# Patient Record
Sex: Female | Born: 1935 | Race: White | Hispanic: No | Smoking: Former smoker
Health system: Southern US, Community
[De-identification: ages and names within clinical notes are randomized; demographics above are authoritative.]

## PROBLEM LIST (undated history)

## (undated) DIAGNOSIS — I071 Rheumatic tricuspid insufficiency: Secondary | ICD-10-CM

## (undated) DIAGNOSIS — G8929 Other chronic pain: Secondary | ICD-10-CM

## (undated) DIAGNOSIS — K5720 Diverticulitis of large intestine with perforation and abscess without bleeding: Secondary | ICD-10-CM

## (undated) DIAGNOSIS — I10 Essential (primary) hypertension: Secondary | ICD-10-CM

## (undated) DIAGNOSIS — K635 Polyp of colon: Secondary | ICD-10-CM

## (undated) DIAGNOSIS — E785 Hyperlipidemia, unspecified: Secondary | ICD-10-CM

## (undated) DIAGNOSIS — I340 Nonrheumatic mitral (valve) insufficiency: Secondary | ICD-10-CM

## (undated) DIAGNOSIS — I429 Cardiomyopathy, unspecified: Secondary | ICD-10-CM

## (undated) DIAGNOSIS — I509 Heart failure, unspecified: Secondary | ICD-10-CM

## (undated) DIAGNOSIS — M549 Dorsalgia, unspecified: Secondary | ICD-10-CM

## (undated) HISTORY — DX: Hyperlipidemia, unspecified: E78.5

## (undated) HISTORY — DX: Nonrheumatic mitral (valve) insufficiency: I34.0

## (undated) HISTORY — PX: ABDOMINAL HYSTERECTOMY: SHX81

## (undated) HISTORY — DX: Rheumatic tricuspid insufficiency: I07.1

## (undated) HISTORY — PX: BLADDER SUSPENSION: SHX72

## (undated) HISTORY — DX: Heart failure, unspecified: I50.9

## (undated) HISTORY — DX: Cardiomyopathy, unspecified: I42.9

## (undated) HISTORY — DX: Essential (primary) hypertension: I10

---

## 2000-09-08 ENCOUNTER — Other Ambulatory Visit: Admission: RE | Admit: 2000-09-08 | Discharge: 2000-09-08 | Payer: Self-pay

## 2001-01-14 ENCOUNTER — Encounter: Payer: Self-pay | Admitting: Family Medicine

## 2001-01-14 ENCOUNTER — Encounter: Admission: RE | Admit: 2001-01-14 | Discharge: 2001-01-14 | Payer: Self-pay | Admitting: Family Medicine

## 2002-01-17 ENCOUNTER — Encounter: Admission: RE | Admit: 2002-01-17 | Discharge: 2002-01-17 | Payer: Self-pay | Admitting: *Deleted

## 2003-01-16 ENCOUNTER — Encounter: Admission: RE | Admit: 2003-01-16 | Discharge: 2003-01-16 | Payer: Self-pay | Admitting: Neurosurgery

## 2003-01-16 ENCOUNTER — Encounter: Payer: Self-pay | Admitting: Neurosurgery

## 2003-01-24 ENCOUNTER — Encounter: Payer: Self-pay | Admitting: Neurosurgery

## 2003-01-24 ENCOUNTER — Encounter: Admission: RE | Admit: 2003-01-24 | Discharge: 2003-01-24 | Payer: Self-pay | Admitting: Neurosurgery

## 2003-01-24 ENCOUNTER — Encounter: Payer: Self-pay | Admitting: Diagnostic Radiology

## 2003-02-09 ENCOUNTER — Ambulatory Visit (HOSPITAL_COMMUNITY): Admission: RE | Admit: 2003-02-09 | Discharge: 2003-02-10 | Payer: Self-pay | Admitting: Neurosurgery

## 2003-02-09 ENCOUNTER — Encounter: Payer: Self-pay | Admitting: Neurosurgery

## 2003-12-12 ENCOUNTER — Ambulatory Visit (HOSPITAL_COMMUNITY): Admission: RE | Admit: 2003-12-12 | Discharge: 2003-12-12 | Payer: Self-pay | Admitting: Anesthesiology

## 2004-01-21 ENCOUNTER — Ambulatory Visit (HOSPITAL_COMMUNITY): Admission: RE | Admit: 2004-01-21 | Discharge: 2004-01-21 | Payer: Self-pay | Admitting: Otolaryngology

## 2004-01-29 ENCOUNTER — Encounter: Admission: RE | Admit: 2004-01-29 | Discharge: 2004-01-29 | Payer: Self-pay | Admitting: Otolaryngology

## 2005-05-16 ENCOUNTER — Inpatient Hospital Stay (HOSPITAL_COMMUNITY): Admission: RE | Admit: 2005-05-16 | Discharge: 2005-05-18 | Payer: Self-pay | Admitting: Obstetrics and Gynecology

## 2007-01-17 ENCOUNTER — Emergency Department (HOSPITAL_COMMUNITY): Admission: EM | Admit: 2007-01-17 | Discharge: 2007-01-17 | Payer: Self-pay | Admitting: Emergency Medicine

## 2013-06-16 ENCOUNTER — Emergency Department (HOSPITAL_COMMUNITY): Payer: Medicare Other

## 2013-06-16 ENCOUNTER — Emergency Department (HOSPITAL_COMMUNITY)
Admission: EM | Admit: 2013-06-16 | Discharge: 2013-06-16 | Disposition: A | Discharge status: Home / Self Care | Payer: Medicare Other | Attending: Emergency Medicine | Admitting: Emergency Medicine

## 2013-06-16 ENCOUNTER — Encounter (HOSPITAL_COMMUNITY): Payer: Self-pay

## 2013-06-16 DIAGNOSIS — Z87891 Personal history of nicotine dependence: Secondary | ICD-10-CM | POA: Insufficient documentation

## 2013-06-16 DIAGNOSIS — Z860100 Personal history of colon polyps, unspecified: Secondary | ICD-10-CM | POA: Insufficient documentation

## 2013-06-16 DIAGNOSIS — S93409A Sprain of unspecified ligament of unspecified ankle, initial encounter: Secondary | ICD-10-CM | POA: Insufficient documentation

## 2013-06-16 DIAGNOSIS — G8929 Other chronic pain: Secondary | ICD-10-CM | POA: Insufficient documentation

## 2013-06-16 DIAGNOSIS — W010XXA Fall on same level from slipping, tripping and stumbling without subsequent striking against object, initial encounter: Secondary | ICD-10-CM | POA: Insufficient documentation

## 2013-06-16 DIAGNOSIS — Y929 Unspecified place or not applicable: Secondary | ICD-10-CM | POA: Insufficient documentation

## 2013-06-16 DIAGNOSIS — Z7982 Long term (current) use of aspirin: Secondary | ICD-10-CM | POA: Insufficient documentation

## 2013-06-16 DIAGNOSIS — S93402A Sprain of unspecified ligament of left ankle, initial encounter: Secondary | ICD-10-CM

## 2013-06-16 DIAGNOSIS — Z79899 Other long term (current) drug therapy: Secondary | ICD-10-CM | POA: Insufficient documentation

## 2013-06-16 DIAGNOSIS — Y9301 Activity, walking, marching and hiking: Secondary | ICD-10-CM | POA: Insufficient documentation

## 2013-06-16 HISTORY — DX: Other chronic pain: G89.29

## 2013-06-16 HISTORY — DX: Diverticulitis of large intestine with perforation and abscess without bleeding: K57.20

## 2013-06-16 HISTORY — DX: Polyp of colon: K63.5

## 2013-06-16 HISTORY — DX: Dorsalgia, unspecified: M54.9

## 2013-06-16 NOTE — ED Provider Notes (Signed)
CSN: [id number]     Arrival date & time 06/16/13  1407 History   First MD Initiated Contact with Patient 06/16/13 1626     Chief Complaint  Patient presents with  . Fall  . Ankle Injury   (Consider location/radiation/quality/duration/timing/severity/associated sxs/prior Treatment) HPI Comments: Patient presents with pain and swelling in the left ankle after an inversion injury while walking. This occurred earlier this afternoon  Patient is a 79 y.o. female presenting with lower extremity injury. The history is provided by the patient.  Ankle Injury This is a new problem. The current episode started 3 to 5 hours ago. The problem occurs constantly. The problem has not changed since onset.The symptoms are aggravated by walking. Nothing relieves the symptoms. She has tried nothing for the symptoms. The treatment provided no relief.    Past Medical History  Diagnosis Date  . Chronic back pain   . Colonic diverticular abscess   . Colon polyps    Past Surgical History  Procedure Laterality Date  . Abdominal hysterectomy    . Bladder suspension     No family history on file. History  Substance Use Topics  . Smoking status: Former Games developer  . Smokeless tobacco: Never Used  . Alcohol Use: No   OB History   Grav Para Term Preterm Abortions TAB SAB Ect Mult Living                 Review of Systems  All other systems reviewed and are negative.    Allergies  Morphine and related  Home Medications   Current Outpatient Rx  Name  Route  Sig  Dispense  Refill  . amitriptyline (ELAVIL) 10 MG tablet   Oral   Take 10 mg by mouth at bedtime.         Marland Kitchen aspirin EC 81 MG tablet   Oral   Take 81 mg by mouth daily.         . butalbital-acetaminophen-caffeine (FIORICET, ESGIC) 50-325-40 MG per tablet   Oral   Take 1 tablet by mouth 2 (two) times daily as needed for headache.         . fish oil-omega-3 fatty acids 1000 MG capsule   Oral   Take 1 g by mouth 2 (two) times  daily.         Marland Kitchen HYDROcodone-acetaminophen (NORCO) 10-325 MG per tablet   Oral   Take 1 tablet by mouth every 4 (four) hours as needed for pain.         Marland Kitchen LORazepam (ATIVAN) 1 MG tablet   Oral   Take 1 mg by mouth every 8 (eight) hours.          BP 142/58  Pulse 75  Temp(Src) 98.9 F (37.2 C) (Oral)  Resp 16  SpO2 96% Physical Exam  Nursing note and vitals reviewed. Constitutional: She is oriented to person, place, and time. She appears well-developed and well-nourished. No distress.  HENT:  Head: Normocephalic and atraumatic.  Mouth/Throat: Oropharynx is clear and moist.  Neck: Normal range of motion. Neck supple.  Musculoskeletal:  There is swelling and ecchymosis inferior to the lateral malleolus of the left ankle. There is no proximal fibular tenderness to palpation and no fifth metatarsal tenderness to palpation. The ankle joint appears stable  Neurological: She is alert and oriented to person, place, and time.  Skin: Skin is warm and dry. She is not diaphoretic.    ED Course  Procedures (including critical care time) Labs Review Labs  Reviewed - No data to display Imaging Review No results found.  MDM  No diagnosis found. X-rays reveal no evidence for fracture. This appears to be a sprain. We'll treat with rest, ice, elevation, and when necessary followup.    Geoffery Lyons, MD 06/16/13 1736

## 2013-06-16 NOTE — Progress Notes (Signed)
   CARE MANAGEMENT ED NOTE 06/16/2013  Patient:  Anita Martinez, Anita Martinez   Account Number:  [id number]  Date Initiated:  06/16/2013  Documentation initiated by:  Edd Arbour  Subjective/Objective Assessment:     Subjective/Objective Assessment Detail:     Action/Plan:   Action/Plan Detail:   Medicare pt without pcp listed Spoke with pt who confirms pcp as Dwight williams Reports having a pain management dr for her back pain Cm assisted pt from w/c to ED chair with reclining legs to decrease her pain   Anticipated DC Date:       Status Recommendation to Physician:   Result of Recommendation:    Other ED Services  Consult Working Plan    DC [employer]  Other  PCP issues  Outpatient Services - Pt will follow up    Choice offered to / List presented to:            Status of service:  Completed, signed off  ED Comments:   ED Comments Detail:

## 2013-06-16 NOTE — ED Notes (Signed)
Pt presents after slipping and falling this morning.  C/o L ankle injury.  Pain score 10/10.  Swelling noted.  Denies LOC.  Sts chronic back pain.

## 2013-06-24 ENCOUNTER — Ambulatory Visit (INDEPENDENT_AMBULATORY_CARE_PROVIDER_SITE_OTHER): Payer: Medicare Other | Admitting: Internal Medicine

## 2013-06-24 VITALS — BP 110/70 | HR 80 | Temp 98.0°F | Resp 18 | Ht 66.0 in | Wt 136.0 lb

## 2013-06-24 DIAGNOSIS — M5432 Sciatica, left side: Secondary | ICD-10-CM

## 2013-06-24 DIAGNOSIS — G8929 Other chronic pain: Secondary | ICD-10-CM

## 2013-06-24 DIAGNOSIS — M5430 Sciatica, unspecified side: Secondary | ICD-10-CM

## 2013-06-24 DIAGNOSIS — Z23 Encounter for immunization: Secondary | ICD-10-CM

## 2013-06-24 MED ORDER — KETOROLAC TROMETHAMINE 60 MG/2ML IM SOLN
60.0000 mg | Freq: Once | INTRAMUSCULAR | Status: AC
  Administered 2013-06-24: 60 mg via INTRAMUSCULAR

## 2013-06-24 NOTE — Progress Notes (Signed)
Subjective:    Patient ID: Anita Martinez, female    DOB: 1933/07/25, 79 y.o.   MRN: [id number]  HPI Having pain in left hip. Uses lidoderm 5% patches. Used 2 last night without relief. Has had sciatica in the past, thinks it is brought on by stress. Last episode of sciatica was 4-5 months ago. Usually gets a shot from Dr. Felicita Gage that relieves pain with in a couple of hours and she is able to drive after. Takes Norco- 2 capsules in the morning with ativan, takes two more norco and ativan about 2 pm, takes 2 capsules with elavil at bedtime.  Fell on a damp stepping stone and sprained left foot a couple of weeks ago. Has wrapped foot. Doing better, walks every day. No other falls in last 6 months.   Husband died last month. Husband died unexpectedly. They didn't live together. Has caregiver who helps with household chores. Patient able to do ADLs and drive.   [facility] pain clinic for back pain/ surgery 2003 disc surgery and vertebrae debridement, has scoliosis. Has been going for years. Saw Dr. Joylene John until she left and has had problems since her regular provider left. Saw Dr. Modena Morrow until he left then was seeing Dr. Mayford Knife.  PSH- Hysterectomy 1964 Bladder sling- year unknown. Disc surgery 2003 Colon abscess 2008- was disabled after hospitalization. Wears abdominal binding since bladder sling/colon abscess surgery, also provides support for back.  SH- Lives alone Daughter lives in [place], Son lives in [place], one son incarcerated. Sister lives near by. Walks with a friend 5x week.   Review of Systems  Constitutional: Positive for activity change. Negative for fever, appetite change and fatigue.  HENT: Positive for hearing loss. Negative for ear pain, congestion, sore throat, rhinorrhea, sneezing, mouth sores, neck pain, neck stiffness, dental problem and postnasal drip.        Left ear nerve damage, hearing loss  Eyes:  Negative.   Respiratory: Negative for apnea, cough, chest tightness, shortness of breath and wheezing.   Cardiovascular: Negative for chest pain, palpitations and leg swelling.  Gastrointestinal: Positive for constipation. Negative for nausea, vomiting, abdominal pain, diarrhea and blood in stool.       Constipation since colon surgery. Uses miralax to have BM 3x week.   Genitourinary: Positive for urgency and frequency. Negative for dysuria, hematuria and difficulty urinating.       Urinary frequency related to high fluid intake. No nocturia.  Musculoskeletal: Positive for back pain.  Neurological: Positive for headaches. Negative for weakness and numbness.       Has occasional headaches.       Objective:   Physical Exam  Constitutional: She is oriented to person, place, and time. She appears well-developed and well-nourished.  HENT:  Mouth/Throat: Oropharynx is clear and moist.  Eyes: Conjunctivae are normal.  Neck: Normal range of motion. Neck supple.  Cardiovascular: Normal rate, regular rhythm, normal heart sounds and intact distal pulses.   Pulmonary/Chest: Effort normal and breath sounds normal.  Musculoskeletal: Normal range of motion. She exhibits tenderness. She exhibits no edema.  Tender over lumbar spine and left buttock. Pain with flexion and abduction. No pain over left trochanter. No bruising, redness.  Lymphadenopathy:    She has no cervical adenopathy.  Neurological: She is alert and oriented to person, place, and time. She has normal reflexes.  Skin: Skin is warm and dry.  Psychiatric: Her behavior is normal. Judgment and thought content normal.  A little tearful when talking about her recently deceased husband.       Assessment & Plan:  Sciatica neuralgia, left - Plan: ketorolac (TORADOL) injection 60 mg Cr Back pain-f/u w/ PM HA syndr- Need for prophylactic vaccination and inoculation against influenza - Plan: Flu Vaccine QUAD 36+ mos IM  Patient to continue  monthly fu with pain clinic. Will return if no improvement or worsening of symptoms. F/U w/ primary care provider for positives in ROS   I participated fully in this evaluation with [facility] FNP I have reviewed and agree with documentation. Robert P. Merla Riches, M.D.

## 2013-06-25 DIAGNOSIS — G8929 Other chronic pain: Secondary | ICD-10-CM | POA: Insufficient documentation

## 2013-07-06 ENCOUNTER — Ambulatory Visit (INDEPENDENT_AMBULATORY_CARE_PROVIDER_SITE_OTHER): Payer: Medicare Other | Admitting: Family Medicine

## 2013-07-06 ENCOUNTER — Other Ambulatory Visit: Payer: Self-pay | Admitting: *Deleted

## 2013-07-06 VITALS — BP 110/76 | HR 76 | Temp 98.9°F | Resp 18 | Ht 64.5 in | Wt 135.6 lb

## 2013-07-06 DIAGNOSIS — B009 Herpesviral infection, unspecified: Secondary | ICD-10-CM

## 2013-07-06 MED ORDER — VALACYCLOVIR HCL 500 MG PO TABS: 500.0000 mg | ORAL_TABLET | Freq: Two times a day (BID) | ORAL | Status: DC

## 2013-07-06 NOTE — Progress Notes (Signed)
Patient ID: Anita Martinez, female   DOB: [birth year], 79 y.o.   MRN: [id number]  @[email or web address] @  Patient ID: Anita Martinez MRN: [id number], DOB: July 12, 1933, 79 y.o. Date of Encounter: 07/06/2013, 12:33 PM This chart was scribed for Anita Sidle, MD by Valera Castle, ED Scribe. This patient was seen in room 13 and the patient's care was started at 12:33 PM.   Primary Physician: No PCP Per Patient  Chief Complaint: Herpes Zoster  HPI: 79 y.o. year old female with history below presents with sudden, moderate shingles on her skin above her tail bone. She states that Dr. Quintella Reichert told her not to get a shingles vaccination, due to her weak immune system. She states she went to pain management as well, and they told her the same thing. She states her immune system is low due to an abscess in her colon, in 2008, and being on so many antibiotics. She reports obtaining a hernia from the colon procedure. She reports daily taking fish oil, 80 mg Aspirin, Vitamin D, and along with her other medications from pain management.   She states her daughter when she was about 32, started getting cold sores.      Past Medical History  Diagnosis Date  . Chronic back pain   . Colonic diverticular abscess   . Colon polyps      Home Meds: Prior to Admission medications   Medication Sig Start Date End Date Taking? Authorizing Provider  amitriptyline (ELAVIL) 10 MG tablet Take 10 mg by mouth at bedtime.   Yes Historical Provider, MD  aspirin EC 81 MG tablet Take 81 mg by mouth daily.   Yes Historical Provider, MD  butalbital-acetaminophen-caffeine (FIORICET, ESGIC) 50-325-40 MG per tablet Take 1 tablet by mouth 2 (two) times daily as needed for headache.   Yes Historical Provider, MD  fish oil-omega-3 fatty acids 1000 MG capsule Take 1 g by mouth 2 (two) times daily.   Yes Historical Provider, MD  HYDROcodone-acetaminophen (NORCO) 10-325 MG per tablet Take by mouth every 4 (four) hours as needed for pain.    Yes  Historical Provider, MD  LORazepam (ATIVAN) 1 MG tablet Take 1 mg by mouth every 8 (eight) hours.   Yes Historical Provider, MD    Allergies:  Allergies  Allergen Reactions  . Morphine And Related     Makes hyper and unable to sleep    History   Social History  . Marital Status: Married    Spouse Name: N/A    Number of Children: N/A  . Years of Education: N/A   Occupational History  . Not on file.   Social History Main Topics  . Smoking status: Former Games developer  . Smokeless tobacco: Never Used  . Alcohol Use: No  . Drug Use: No  . Sexual Activity: Not on file   Other Topics Concern  . Not on file   Social History Narrative  . No narrative on file     Review of Systems: Constitutional: negative for chills, fever, night sweats, weight changes, or fatigue  HEENT: negative for vision changes, hearing loss, congestion, rhinorrhea, ST, epistaxis, or sinus pressure Cardiovascular: negative for chest pain or palpitations Respiratory: negative for hemoptysis, wheezing, shortness of breath, or cough Abdominal: negative for abdominal pain, nausea, vomiting, diarrhea, or constipation Dermatological: Positive for skin rash Neurologic: negative for headache, dizziness, or syncope All other systems reviewed and are otherwise negative with the exception to those above and in the HPI.   Physical Exam: Blood  pressure 110/76, pulse 76, temperature 98.9 F (37.2 C), temperature source Oral, resp. rate 18, height 5' 4.5" (1.638 m), weight 135 lb 9.6 oz (61.508 kg), SpO2 96.00%., Body mass index is 22.92 kg/(m^2). General: Well developed, well nourished, in no acute distress. Head: Normocephalic, atraumatic, eyes without discharge, sclera non-icteric, nares are without discharge. Bilateral auditory canals clear, TM's are without perforation, pearly grey and translucent with reflective cone of light bilaterally. Oral cavity moist, posterior pharynx without exudate, erythema, peritonsillar  abscess, or post nasal drip.  Neck: Supple. No thyromegaly. Full ROM. No lymphadenopathy. Lungs: Clear bilaterally to auscultation without wheezes, rales, or rhonchi. Breathing is unlabored. Heart: RRR with S1 S2. No murmurs, rubs, or gallops appreciated. Abdomen: Soft, non-tender, non-distended with normoactive bowel sounds. No hepatomegaly. No rebound/guarding. No obvious abdominal masses. Msk:  Strength and tone normal for age. Extremities/Skin: Warm and dry. No clubbing or cyanosis. No edema. Cluster of pustules at superior edge of gluteal cleft. Neuro: Alert and oriented X 3. Moves all extremities spontaneously. Gait is normal. CNII-XII grossly in tact. Psych:  Responds to questions appropriately with a normal affect.    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  Signed, Anita Sidle, MD 07/06/2013 12:33 PM  Scribe generated note by Valera Castle

## 2013-07-06 NOTE — Patient Instructions (Signed)
Cold Sore  A cold sore (fever blister) is a skin infection caused by the herpes simplex virus (HSV-1). HSV-1 is closely related to the virus that causes gential herpes (HSV-2), but they are not the same even though both viruses can cause oral and genital infections. Cold sores are small, fluid-filled sores inside of the mouth or on the lips, gums, nose, chin, cheeks, or fingers.   The herpes simplex virus can be easily passed (contagious) to other people through close personal contact, such as kissing or sharing personal items. The virus can also spread to other parts of the body, such as the eyes or genitals. Cold sores are contagious until the sores crust over completely. They often heal within 2 weeks.   Once a person is infected, the herpes simplex virus remains permanently in the body. Therefore, there is no cure for cold sores, and they often recur when a person is tired, stressed, sick, or gets too much sun. Additional factors that can cause a recurrence include hormone changes in menstruation or pregnancy, certain drugs, and cold weather.   CAUSES   Cold sores are caused by the herpes simplex virus. The virus is spread from person to person through close contact, such as through kissing, touching the affected area, or sharing personal items such as lip balm, razors, or eating utensils.   SYMPTOMS   The first infection may not cause symptoms. If symptoms develop, the symptoms often go through different stages. Here is how a cold sore develops:   · Tingling, itching, or burning is felt 1 2 days before the outbreak.    · Fluid-filled blisters appear on the lips, inside the mouth, nose, or on the cheeks.    · The blisters start to ooze clear fluid.    · The blisters dry up and a yellow crust appears in its place.    · The crust falls off.    Symptoms depend on whether it is the initial outbreak or a recurrence. Some other symptoms with the first outbreak may include:   · Fever.    · Sore throat.    · Headache.     · Muscle aches.    · Swollen neck glands.    DIAGNOSIS   A diagnosis is often made based on your symptoms and looking at the sores. Sometimes, a sore may be swabbed and then examined in the lab to make a final diagnosis. If the sores are not present, blood tests can find the herpes simplex virus.   TREATMENT   There is no cure for cold sores and no vaccine for the herpes simplex virus. Within 2 weeks, most cold sores go away on their own without treatment. Medicines cannot make the infection go away, but medicine can help relieve some of the pain associated with the sores, can work to stop the virus from multiplying, and can also shorten healing time. Medicine may be in the form of creams, gels, pills, or a shot.   HOME CARE INSTRUCTIONS   · Only take over-the-counter or prescription medicines for pain, discomfort, or fever as directed by your caregiver. Do not use aspirin.    · Use a cotton-tip swab to apply creams or gels to your sores.    · Do not touch the sores or pick the scabs. Wash your hands often. Do not touch your eyes without washing your hands first.    · Avoid kissing, oral sex, and sharing personal items until sores heal.    · Apply an ice pack on your sores for 10 15 minutes to ease any   discomfort.    · Avoid hot, cold, or salty foods because they may hurt your mouth. Eat a soft, bland diet to avoid irritating the sores. Use a straw to drink if you have pain when drinking out of a glass.    · Keep sores clean and dry to prevent an infection of other tissues.    · Avoid the sun and limit stress if these things trigger outbreaks. If sun causes cold sores, apply sunscreen on the lips before being out in the sun.    SEEK MEDICAL CARE IF:   · You have a fever or persistent symptoms for more than 2 3 days.    · You have a fever and your symptoms suddenly get worse.    · You have pus, not clear fluid, coming from the sores.    · You have redness that is spreading.    · You have pain or irritation in your  eye.    · You get sores on your genitals.    · Your sores do not heal within 2 weeks.    · You have a weakened immune system.    · You have frequent recurrences of cold sores.    MAKE SURE YOU:   · Understand these instructions.  · Will watch your condition.  · Will get help right away if you are not doing well or get worse.  Document Released: 09/05/2000 Document Revised: 06/02/2012 Document Reviewed: 01/21/2012  [employer]® Patient Information ©2014 [employer].

## 2013-08-11 ENCOUNTER — Other Ambulatory Visit: Payer: Self-pay | Admitting: Pathology

## 2014-07-06 ENCOUNTER — Other Ambulatory Visit: Payer: Self-pay | Admitting: Family Medicine

## 2018-01-13 ENCOUNTER — Other Ambulatory Visit: Payer: Self-pay | Admitting: Internal Medicine

## 2018-01-13 DIAGNOSIS — R1900 Intra-abdominal and pelvic swelling, mass and lump, unspecified site: Secondary | ICD-10-CM

## 2018-01-14 ENCOUNTER — Other Ambulatory Visit: Payer: Self-pay | Admitting: Internal Medicine

## 2018-01-14 DIAGNOSIS — R198 Other specified symptoms and signs involving the digestive system and abdomen: Secondary | ICD-10-CM

## 2018-01-18 ENCOUNTER — Other Ambulatory Visit: Payer: Self-pay

## 2018-01-20 ENCOUNTER — Ambulatory Visit
Admission: RE | Admit: 2018-01-20 | Discharge: 2018-01-20 | Disposition: A | Discharge status: Home / Self Care | Payer: Medicare Other | Source: Ambulatory Visit | Attending: Internal Medicine | Admitting: Internal Medicine

## 2018-01-20 ENCOUNTER — Other Ambulatory Visit: Payer: Self-pay | Admitting: Internal Medicine

## 2018-01-20 DIAGNOSIS — R1900 Intra-abdominal and pelvic swelling, mass and lump, unspecified site: Secondary | ICD-10-CM

## 2018-01-20 MED ORDER — IOPAMIDOL (ISOVUE-300) INJECTION 61%
100.0000 mL | Freq: Once | INTRAVENOUS | Status: AC | PRN
  Administered 2018-01-20: 100 mL via INTRAVENOUS

## 2019-08-10 ENCOUNTER — Ambulatory Visit (INDEPENDENT_AMBULATORY_CARE_PROVIDER_SITE_OTHER): Payer: Medicare Other | Admitting: Otolaryngology

## 2019-08-10 ENCOUNTER — Other Ambulatory Visit: Payer: Self-pay

## 2019-08-10 ENCOUNTER — Encounter (INDEPENDENT_AMBULATORY_CARE_PROVIDER_SITE_OTHER): Payer: Self-pay | Admitting: Otolaryngology

## 2019-08-10 VITALS — Temp 97.5°F

## 2019-08-10 DIAGNOSIS — H6121 Impacted cerumen, right ear: Secondary | ICD-10-CM | POA: Diagnosis not present

## 2019-08-10 NOTE — Progress Notes (Signed)
HPI: Anita Martinez is a 85 y.o. female who presents for evaluation of wax buildup in the right ear. [facility] and had [facility]ogram that demonstrated bilateral moderate to severe sensorineural hearing loss and is scheduled to get hearing aids.  On otoscopic exam of the right ear she had wax deep adjacent to the TM.  They recommended having this cleaned prior to getting hearing aids fitted.  Past Medical History:  Diagnosis Date  . Chronic back pain   . Colon polyps   . Colonic diverticular abscess    Past Surgical History:  Procedure Laterality Date  . ABDOMINAL HYSTERECTOMY    . BLADDER SUSPENSION     Social History   Socioeconomic History  . Marital status: Married    Spouse name: Not on file  . Number of children: Not on file  . Years of education: Not on file  . Highest education level: Not on file  Occupational History  . Not on file  Social Needs  . Financial resource strain: Not on file  . Food insecurity    Worry: Not on file    Inability: Not on file  . Transportation needs    Medical: Not on file    Non-medical: Not on file  Tobacco Use  . Smoking status: Former Smoker    Packs/day: 1.00    Years: 14.00    Pack years: 14.00    Start date: 28    Quit date: 1970    Years since quitting: 50.9  . Smokeless tobacco: Never Used  Substance and Sexual Activity  . Alcohol use: No  . Drug use: No  . Sexual activity: Not on file  Lifestyle  . Physical activity    Days per week: Not on file    Minutes per session: Not on file  . Stress: Not on file  Relationships  . Social Musician on phone: Not on file    Gets together: Not on file    Attends religious service: Not on file    Active member of club or organization: Not on file    Attends meetings of clubs or organizations: Not on file    Relationship status: Not on file  Other Topics Concern  . Not on file  Social History Narrative  . Not on file   No family history on  file. Allergies  Allergen Reactions  . Morphine And Related     Makes hyper and unable to sleep   Prior to Admission medications   Medication Sig Start Date End Date Taking? Authorizing Provider  amitriptyline (ELAVIL) 10 MG tablet Take 10 mg by mouth at bedtime.   Yes [provider]  aspirin EC 81 MG tablet Take 81 mg by mouth daily.   Yes [provider]  butalbital-acetaminophen-caffeine (FIORICET, ESGIC) 50-325-40 MG per tablet Take 1 tablet by mouth 2 (two) times daily as needed for headache.   Yes [provider]  fish oil-omega-3 fatty acids 1000 MG capsule Take 1 g by mouth 2 (two) times daily.   Yes [provider]  HYDROcodone-acetaminophen (NORCO) 10-325 MG per tablet Take by mouth every 4 (four) hours as needed for pain.    Yes [provider]  LORazepam (ATIVAN) 1 MG tablet Take 1 mg by mouth every 8 (eight) hours.   Yes [provider]  valACYclovir (VALTREX) 500 MG tablet TAKE 1 TABLET BY MOUTH TWICE DAILY 07/06/14  Yes Ethelda Chick, MD  Positive ROS: Otherwise negative  All other systems have been reviewed and were otherwise negative with the exception of those mentioned in the HPI and as above.  Physical Exam: Constitutional: Alert, well-appearing, no acute distress Ears: External ears without lesions or tenderness. Ear canals she had wax deep within the right ear canal adjacent to the right TM.  She had minimal wax on the left side which was nonobstructing. Nasal: External nose without lesions. Clear nasal passages Oral: Oropharynx clear. Neck: No palpable adenopathy or masses Respiratory: Breathing comfortably  Skin: No facial/neck lesions or rash noted.  Cerumen impaction removal  Date/Time: 08/10/2019 12:17 PM Performed by: Rozetta Nunnery, MD Authorized by: Rozetta Nunnery, MD   Consent:    Consent obtained:  Verbal   Consent given by:  Patient   Risks discussed:  Pain and  bleeding Procedure details:    Location:  R ear   Procedure type: suction   Post-procedure details:    Inspection:  TM intact and canal normal   Hearing quality:  Improved   Patient tolerance of procedure:  Tolerated well, no immediate complications    Assessment: Right ear cerumen impaction  Plan: This was cleaned in the office.  She will follow-up as needed.  Radene Journey, MD

## 2019-09-12 ENCOUNTER — Other Ambulatory Visit: Payer: Self-pay | Admitting: Anesthesiology

## 2019-09-12 ENCOUNTER — Other Ambulatory Visit: Payer: Self-pay

## 2019-09-12 ENCOUNTER — Ambulatory Visit
Admission: RE | Admit: 2019-09-12 | Discharge: 2019-09-12 | Disposition: A | Discharge status: Home / Self Care | Payer: Medicare Other | Source: Ambulatory Visit | Attending: Anesthesiology | Admitting: Anesthesiology

## 2019-09-12 ENCOUNTER — Other Ambulatory Visit: Payer: Self-pay | Admitting: General Practice

## 2019-09-12 DIAGNOSIS — R52 Pain, unspecified: Secondary | ICD-10-CM

## 2020-01-27 ENCOUNTER — Emergency Department (HOSPITAL_COMMUNITY): Payer: Medicare Other

## 2020-01-27 ENCOUNTER — Emergency Department (HOSPITAL_COMMUNITY)
Admission: EM | Admit: 2020-01-27 | Discharge: 2020-01-27 | Disposition: A | Discharge status: Home / Self Care | Payer: Medicare Other | Attending: Emergency Medicine | Admitting: Emergency Medicine

## 2020-01-27 ENCOUNTER — Encounter (HOSPITAL_COMMUNITY): Payer: Self-pay

## 2020-01-27 ENCOUNTER — Other Ambulatory Visit: Payer: Self-pay

## 2020-01-27 DIAGNOSIS — I509 Heart failure, unspecified: Secondary | ICD-10-CM | POA: Insufficient documentation

## 2020-01-27 DIAGNOSIS — N390 Urinary tract infection, site not specified: Secondary | ICD-10-CM | POA: Diagnosis not present

## 2020-01-27 DIAGNOSIS — Z79899 Other long term (current) drug therapy: Secondary | ICD-10-CM | POA: Diagnosis not present

## 2020-01-27 DIAGNOSIS — Z7982 Long term (current) use of aspirin: Secondary | ICD-10-CM | POA: Diagnosis not present

## 2020-01-27 DIAGNOSIS — R945 Abnormal results of liver function studies: Secondary | ICD-10-CM | POA: Insufficient documentation

## 2020-01-27 DIAGNOSIS — R7989 Other specified abnormal findings of blood chemistry: Secondary | ICD-10-CM

## 2020-01-27 DIAGNOSIS — R42 Dizziness and giddiness: Secondary | ICD-10-CM | POA: Diagnosis not present

## 2020-01-27 DIAGNOSIS — Z87891 Personal history of nicotine dependence: Secondary | ICD-10-CM | POA: Insufficient documentation

## 2020-01-27 DIAGNOSIS — E860 Dehydration: Secondary | ICD-10-CM | POA: Diagnosis not present

## 2020-01-27 DIAGNOSIS — R5383 Other fatigue: Secondary | ICD-10-CM | POA: Diagnosis present

## 2020-01-27 LAB — CBC
HCT: 41.2 % (ref 36.0–46.0)
Hemoglobin: 12.7 g/dL (ref 12.0–15.0)
MCH: 29.3 pg (ref 26.0–34.0)
MCHC: 30.8 g/dL (ref 30.0–36.0)
MCV: 94.9 fL (ref 80.0–100.0)
Platelets: 188 10*3/uL (ref 150–400)
RBC: 4.34 MIL/uL (ref 3.87–5.11)
RDW: 15.4 % (ref 11.5–15.5)
WBC: 9.4 10*3/uL (ref 4.0–10.5)
nRBC: 0 % (ref 0.0–0.2)

## 2020-01-27 LAB — COMPREHENSIVE METABOLIC PANEL
ALT: 231 U/L — ABNORMAL HIGH (ref 0–44)
AST: 279 U/L — ABNORMAL HIGH (ref 15–41)
Albumin: 3.6 g/dL (ref 3.5–5.0)
Alkaline Phosphatase: 98 U/L (ref 38–126)
Anion gap: 14 (ref 5–15)
BUN: 34 mg/dL — ABNORMAL HIGH (ref 8–23)
CO2: 22 mmol/L (ref 22–32)
Calcium: 9.4 mg/dL (ref 8.9–10.3)
Chloride: 103 mmol/L (ref 98–111)
Creatinine, Ser: 0.99 mg/dL (ref 0.44–1.00)
GFR calc Af Amer: >60 mL/min (ref >60)
GFR calc non Af Amer: 52 mL/min — ABNORMAL LOW (ref >60)
Glucose, Bld: 116 mg/dL — ABNORMAL HIGH (ref 70–99)
Potassium: 4.3 mmol/L (ref 3.5–5.1)
Sodium: 139 mmol/L (ref 135–145)
Total Bilirubin: 1.7 mg/dL — ABNORMAL HIGH (ref 0.3–1.2)
Total Protein: 7.3 g/dL (ref 6.5–8.1)

## 2020-01-27 LAB — URINALYSIS, ROUTINE W REFLEX MICROSCOPIC
Bilirubin Urine: NEGATIVE
Glucose, UA: NEGATIVE mg/dL
Ketones, ur: NEGATIVE mg/dL
Nitrite: NEGATIVE
Protein, ur: 100 mg/dL — AB
Specific Gravity, Urine: 1.026 (ref 1.005–1.030)
pH: 5 (ref 5.0–8.0)

## 2020-01-27 LAB — TROPONIN I (HIGH SENSITIVITY)
Troponin I (High Sensitivity): 24 ng/L — ABNORMAL HIGH (ref <18)
Troponin I (High Sensitivity): 25 ng/L — ABNORMAL HIGH (ref <18)

## 2020-01-27 LAB — LACTIC ACID, PLASMA
Lactic Acid, Venous: 2.4 mmol/L (ref 0.5–1.9)
Lactic Acid, Venous: 2.2 mmol/L (ref 0.5–1.9)

## 2020-01-27 LAB — BRAIN NATRIURETIC PEPTIDE: B Natriuretic Peptide: 2299.1 pg/mL — ABNORMAL HIGH (ref 0.0–100.0)

## 2020-01-27 LAB — D-DIMER, QUANTITATIVE: D-Dimer, Quant: 6.55 ug/mL-FEU — ABNORMAL HIGH (ref 0.00–0.50)

## 2020-01-27 LAB — CBG MONITORING, ED: Glucose-Capillary: 95 mg/dL (ref 70–99)

## 2020-01-27 MED ORDER — SODIUM CHLORIDE 0.9 % IV SOLN
1.0000 g | Freq: Once | INTRAVENOUS | Status: AC
  Administered 2020-01-27: 1 g via INTRAVENOUS
  Filled 2020-01-27: qty 10

## 2020-01-27 MED ORDER — CEPHALEXIN 250 MG PO CAPS
250.0000 mg | ORAL_CAPSULE | Freq: Four times a day (QID) | ORAL | 0 refills | Status: DC
Start: 2020-01-27 — End: 2020-02-08

## 2020-01-27 MED ORDER — SODIUM CHLORIDE 0.9 % IV BOLUS
1000.0000 mL | Freq: Once | INTRAVENOUS | Status: AC
  Administered 2020-01-27: 1000 mL via INTRAVENOUS

## 2020-01-27 MED ORDER — SODIUM CHLORIDE (PF) 0.9 % IJ SOLN
INTRAMUSCULAR | Status: AC
  Filled 2020-01-27: qty 50

## 2020-01-27 MED ORDER — IOHEXOL 350 MG/ML SOLN
80.0000 mL | Freq: Once | INTRAVENOUS | Status: AC | PRN
  Administered 2020-01-27: 80 mL via INTRAVENOUS

## 2020-01-27 MED ORDER — SODIUM CHLORIDE 0.9% FLUSH: 3.0000 mL | Freq: Once | INTRAVENOUS | Status: DC

## 2020-01-27 NOTE — ED Triage Notes (Signed)
Patient c/o fatigue and dizziness. Patient states "I just do not feel good. My mouth is dry, I feel air going through my ear and mouth when I talk.

## 2020-01-27 NOTE — ED Notes (Signed)
Pt ambulate to bathroom with assistance to provide urine sample. Pt appears to be steady on her feet at this time.

## 2020-01-27 NOTE — ED Notes (Signed)
Pt advised by provider to be admitted to hospital due to diagnosis, but  Pt states she does not want to stay and wants to go home. Pt and daughter both made aware by provider of severity and possible outcomes with refusal of admission. Pt states she understood and would still like to go home. Pt advised to make and appointment with cardiologist and PCP as soon as possible.

## 2020-01-27 NOTE — Discharge Instructions (Signed)
You have decided to leave AGAINST MEDICAL ADVICE.  This means you may have a condition that could worsen and result in death.   Take antibiotics as prescribed.  Take the entire course, even if your symptoms improve. Your liver function was abnormal today.  Follow-up with your primary care doctor for recheck.  This may be related to your new diagnosis of heart failure.  It is very important that you follow up with a heart doctor for further evaluation of your heart failure. Call the office listed below to be seen as soon as possible.   Return to the ER with any new, worsening, or concerning symptoms.

## 2020-01-27 NOTE — ED Notes (Signed)
Pt transported to CT ?

## 2020-01-28 NOTE — ED Provider Notes (Signed)
[facility]-EMERGENCY DEPT Provider Note   CSN: [id number] Arrival date & time: 01/27/20  1337     History Chief Complaint  Patient presents with  . Fatigue    Anita Martinez is a 86 y.o. female presenting for evaluation of weakness.   Patient states she lives at home by herself.  Over the past 4 days, she has been feeling very weak and tired.  She states she is spending most of her time on her couch.  She is feeling very thirsty.  She does have a home health aide that comes several days a week, but is at home by herself or much of the time.  She denies recent fevers, chills, chest pain, cough, shortness of breath, nausea, vomiting, Donnell pain, urinary symptoms, normal bowel movements.  She denies recent medication changes.  She reports no significant medical problems, does not take any medications daily.  Additional history obtained from chart review.  Patient without significant documented medical problems including DM, HTN, cardiac conditions.  HPI     Past Medical History:  Diagnosis Date  . Chronic back pain   . Colon polyps   . Colonic diverticular abscess     Patient Active Problem List   Diagnosis Date Noted  . HA (headache) 06/25/2013  . Chronic back pain 06/25/2013    Past Surgical History:  Procedure Laterality Date  . ABDOMINAL HYSTERECTOMY    . BLADDER SUSPENSION       OB History   No obstetric history on file.     History reviewed. No pertinent family history.  Social History   Tobacco Use  . Smoking status: Former Smoker    Packs/day: 1.00    Years: 14.00    Pack years: 14.00    Start date: 64    Quit date: 1970    Years since quitting: 51.3  . Smokeless tobacco: Never Used  Substance Use Topics  . Alcohol use: No  . Drug use: No    Home Medications Prior to Admission medications   Medication Sig Start Date End Date Taking? Authorizing Provider  amitriptyline (ELAVIL) 10 MG tablet Take 10 mg by mouth at  bedtime.    [provider]  aspirin EC 81 MG tablet Take 81 mg by mouth daily.    [provider]  butalbital-acetaminophen-caffeine (FIORICET, ESGIC) 50-325-40 MG per tablet Take 1 tablet by mouth 2 (two) times daily as needed for headache.    [provider]  cephALEXin (KEFLEX) 250 MG capsule Take 1 capsule (250 mg total) by mouth 4 (four) times daily for 5 days. 01/27/20 02/01/20  Jamelle Goldston, PA-C  fish oil-omega-3 fatty acids 1000 MG capsule Take 1 g by mouth 2 (two) times daily.    [provider]  HYDROcodone-acetaminophen (NORCO) 10-325 MG per tablet Take by mouth every 4 (four) hours as needed for pain.     [provider]  LORazepam (ATIVAN) 1 MG tablet Take 1 mg by mouth every 8 (eight) hours.    [provider]  valACYclovir (VALTREX) 500 MG tablet TAKE 1 TABLET BY MOUTH TWICE DAILY 07/06/14   Ethelda Chick, MD    Allergies    Morphine and related  Review of Systems   Review of Systems  Neurological: Positive for weakness.  All other systems reviewed and are negative.   Physical Exam Updated Vital Signs BP (!) 108/91 (BP Location: Left Arm)   Pulse (!) 105   Temp 97.8 F (36.6 C) (Oral)  Resp 18   Ht 5\' 5"  (1.651 m)   Wt 49.4 kg   SpO2 99%   BMI 18.14 kg/m   Physical Exam Vitals and nursing note reviewed.  Constitutional:      General: She is not in acute distress.    Appearance: She is cachectic.     Comments: Cachectic appearing 86 year old  HENT:     Head: Normocephalic and atraumatic.     Mouth/Throat:     Mouth: Mucous membranes are dry.     Comments: MM dry Eyes:     Conjunctiva/sclera: Conjunctivae normal.     Pupils: Pupils are equal, round, and reactive to light.  Cardiovascular:     Rate and Rhythm: Tachycardia present. Rhythm irregular.     Pulses: Normal pulses.     Comments: Tachycardic and irregular heart rate, but not A. fib on EKG Pulmonary:     Effort: Pulmonary effort is  normal. No respiratory distress.     Breath sounds: Normal breath sounds. No wheezing.     Comments: Rales in bilateral lower bases. Speaking in full sentences. SpO2 stable on RA Abdominal:     General: There is no distension.     Palpations: Abdomen is soft. There is no mass.     Tenderness: There is no abdominal tenderness. There is no guarding or rebound.  Musculoskeletal:        General: Normal range of motion.     Cervical back: Normal range of motion and neck supple.     Right lower leg: No edema.     Left lower leg: No edema.     Comments: No pitting edema. No leg pain or swelling  Skin:    General: Skin is warm and dry.     Capillary Refill: Capillary refill takes less than 2 seconds.  Neurological:     Mental Status: She is alert and oriented to person, place, and time.     ED Results / Procedures / Treatments   Labs (all labs ordered are listed, but only abnormal results are displayed) Labs Reviewed  URINALYSIS, ROUTINE W REFLEX MICROSCOPIC - Abnormal; Notable for the following components:      Result Value   Color, Urine AMBER (*)    APPearance HAZY (*)    Hgb urine dipstick SMALL (*)    Protein, ur 100 (*)    Leukocytes,Ua TRACE (*)    Bacteria, UA MANY (*)    All other components within normal limits  LACTIC ACID, PLASMA - Abnormal; Notable for the following components:   Lactic Acid, Venous 2.4 (*)    All other components within normal limits  LACTIC ACID, PLASMA - Abnormal; Notable for the following components:   Lactic Acid, Venous 2.2 (*)    All other components within normal limits  COMPREHENSIVE METABOLIC PANEL - Abnormal; Notable for the following components:   Glucose, Bld 116 (*)    BUN 34 (*)    AST 279 (*)    ALT 231 (*)    Total Bilirubin 1.7 (*)    GFR calc non Af Amer 52 (*)    All other components within normal limits  D-DIMER, QUANTITATIVE (NOT AT [facility]) - Abnormal; Notable for the following components:   D-Dimer, Quant 6.55 (*)    All  other components within normal limits  BRAIN NATRIURETIC PEPTIDE - Abnormal; Notable for the following components:   B Natriuretic Peptide 2,299.1 (*)    All other components within normal limits  TROPONIN I (HIGH  SENSITIVITY) - Abnormal; Notable for the following components:   Troponin I (High Sensitivity) 24 (*)    All other components within normal limits  TROPONIN I (HIGH SENSITIVITY) - Abnormal; Notable for the following components:   Troponin I (High Sensitivity) 25 (*)    All other components within normal limits  URINE CULTURE  CBC  CBG MONITORING, ED    EKG EKG Interpretation  Date/Time:  Friday Jan 27 2020 15:58:44 EDT Ventricular Rate:  118 PR Interval:    QRS Duration: 82 QT Interval:  322 QTC Calculation: 452 R Axis:   105 Text Interpretation: Right and left arm electrode reversal, interpretation assumes no reversal Sinus tachycardia Anterior infarct, old Borderline repolarization abnormality Since last tracing rate faster Confirmed by Dorie Rank [phone]) on 01/27/2020 4:49:12 PM   Radiology DG Chest 2 View  Result Date: 01/27/2020 CLINICAL DATA:  Tachycardia EXAM: CHEST - 2 VIEW COMPARISON:  April 03, 2016 FINDINGS: The lung volumes are low. There are new small bilateral pleural effusions, left greater than right. There is an airspace opacity at the left lung base. There are prominent interstitial lung markings. There is a nodular density projecting over the anterior right first rib which appears more conspicuous on today's exam. There is some pleuroparenchymal scarring at the lung apices. There is no pneumothorax. There is osteopenia. IMPRESSION: 1. New small bilateral pleural effusions, left greater than right. 2. New airspace opacity at the left lung base may represent atelectasis or infiltrate. 3. Vascular congestion. 4. Questionable pulmonary nodule overlying the anterior right first rib. Consider a 4-6 week follow-up two-view chest x-ray to confirm stability or  resolution of this finding. Electronically Signed   By: Constance Holster M.D.   On: 01/27/2020 16:03   CT Head Wo Contrast  Result Date: 01/27/2020 CLINICAL DATA:  Fatigue, dizziness, weakness EXAM: CT HEAD WITHOUT CONTRAST TECHNIQUE: Contiguous axial images were obtained from the base of the skull through the vertex without intravenous contrast. COMPARISON:  05/25/2006 FINDINGS: Brain: No evidence of acute infarction, hemorrhage, hydrocephalus, extra-axial collection or mass lesion/mass effect. Scattered low-density changes within the periventricular and subcortical white matter compatible with chronic microvascular ischemic change. Mild diffuse cerebral volume loss. Vascular: Mild atherosclerotic calcifications involving the large vessels of the skull base. No unexpected hyperdense vessel. Skull: Normal. Negative for fracture or focal lesion. Sinuses/Orbits: No acute finding. Other: None. IMPRESSION: 1.  No acute intracranial findings. 2.  Chronic microvascular ischemic change and cerebral volume loss. Electronically Signed   By: Davina Poke D.O.   On: 01/27/2020 15:57   CT Angio Chest PE W and/or Wo Contrast  Result Date: 01/27/2020 CLINICAL DATA:  Shortness of breath, positive D dimer EXAM: CT ANGIOGRAPHY CHEST WITH CONTRAST TECHNIQUE: Multidetector CT imaging of the chest was performed using the standard protocol during bolus administration of intravenous contrast. Multiplanar CT image reconstructions and MIPs were obtained to evaluate the vascular anatomy. CONTRAST:  43mL OMNIPAQUE IOHEXOL 350 MG/ML SOLN COMPARISON:  01/27/2020 FINDINGS: Cardiovascular: This is a technically adequate evaluation of the pulmonary vasculature. There are no filling defects or pulmonary emboli. Heart is mildly enlarged, with prominent biatrial dilatation, right greater than left. Reflux of contrast into the hepatic veins suggest a degree of cardiac dysfunction. Thoracic aorta demonstrates normal caliber, with extensive  atherosclerosis. Mediastinum/Nodes: No enlarged mediastinal, hilar, or axillary lymph nodes. Thyroid gland, trachea, and esophagus demonstrate no significant findings. Lungs/Pleura: Moderate bilateral pleural effusions, less than 2 L each. There is compressive atelectasis at the lung bases. Scattered  ground-glass airspace disease with background interlobular septal thickening suggests pulmonary edema. No pneumothorax. Central airways are patent. Upper Abdomen: No acute abnormality. Musculoskeletal: No acute or destructive bony lesions. Reconstructed images demonstrate no additional findings. Review of the MIP images confirms the above findings. IMPRESSION: 1. No evidence of pulmonary embolus. 2. Findings consistent with congestive heart failure, with moderate bilateral pleural effusions and scattered ground-glass airspace disease. 3. Nodularity described over the right anterior first rib on chest x-ray corresponds to patchy airspace disease and confluence of shadows based on CT findings. No underlying pulmonary nodule or mass. 4. Aortic Atherosclerosis (ICD10-I70.0). Electronically Signed   By: Sharlet Salina M.D.   On: 01/27/2020 19:02    Procedures Procedures (including critical care time)  Medications Ordered in ED Medications  sodium chloride 0.9 % bolus 1,000 mL (0 mLs Intravenous Stopped 01/27/20 1738)  iohexol (OMNIPAQUE) 350 MG/ML injection 80 mL (80 mLs Intravenous Contrast Given 01/27/20 1823)  cefTRIAXone (ROCEPHIN) 1 g in sodium chloride 0.9 % 100 mL IVPB (0 g Intravenous Stopped 01/27/20 2022)    ED Course  I have reviewed the triage vital signs and the nursing notes.  Pertinent labs & imaging results that were available during my care of the patient were reviewed by me and considered in my medical decision making (see chart for details).    MDM Rules/Calculators/A&P                      Patient presenting for evaluation of generalized weakness.  On exam, she is cachectic and appears  dry.  She is tachycardic with an irregular heart rate, but not in A. fib.  Concern for infection versus dehydration versus AKI versus PE. Will order labs, UA, CXR. CT head due to weakness.   Labs show elevated dimer at 6.55.  As such, concern for PE versus cancer.  Electrolytes show elevated LFTs and mildly elevated bili at 1.7, however no abdominal tenderness.  Without fever and pain, low suspicion for cholecystitis.  Lactic elevated at 2.4, consider infectious cause, though patient is afebrile and without leukocytosis.  We will continue to investigate.  Chest x-ray viewed interpreted by me, shows bilateral pleural effusion at the bases and hazy opacity on the left.  Also showing vascular congestion.  Concern for CHF.  We will add on BNP.  Will obtain CTA of the chest for further evaluation.  Heart rate improving with fluids, but still mildly elevated around 105.  CTA negative for PE or cancer.  Does show pleural effusions and opacities consistent with infection versus infiltrate versus edema.  BNP elevated at 2300.  Concern for new onset heart failure.  However patient is also dehydrated, she will need gentle fluids while monitoring her heart failure status.  Urine positive for infection.  Troponin mildly elevated at 24, repeat 25.  This is likely due to demand.  Without signs of STEMI on EKG or chest pain, doubt ACS.  Discussed findings with patient and daughter.  Discussed recommendation for admission for new onset heart failure, dehydration, and UTI.  Patient was adamant that she wanted to go home.  I discussed risks of discharge including death, patient states she understands but would like to leave.  Patient's daughter was present for this discussion.  As patient is adamant she wants to leave, will give antibiotics for UTI.  We will have her follow-up closely with cardiology, information given.  Stressed to daughter importance of close follow-up with cardiology, and prompt return to the ER  with any  worsening symptoms. Pt appears to be in her right mind and able to make informed medical decision. She signed AMA paperwork.    Final Clinical Impression(s) / ED Diagnoses Final diagnoses:  Acute heart failure, unspecified heart failure type (HCC)  Dehydration  Urinary tract infection without hematuria, site unspecified  Elevated LFTs    Rx / DC Orders ED Discharge Orders         Ordered    cephALEXin (KEFLEX) 250 MG capsule  4 times daily     01/27/20 1935           Alveria Apley, PA-C 01/28/20 1129    Linwood Dibbles, MD 01/28/20 1614

## 2020-01-30 ENCOUNTER — Inpatient Hospital Stay (HOSPITAL_COMMUNITY)
Admission: EM | Admit: 2020-01-30 | Discharge: 2020-02-08 | DRG: 291 | Disposition: A | Discharge status: Home Health | Payer: Medicare Other | Attending: Internal Medicine | Admitting: Internal Medicine

## 2020-01-30 ENCOUNTER — Emergency Department (HOSPITAL_COMMUNITY): Payer: Medicare Other

## 2020-01-30 ENCOUNTER — Encounter (HOSPITAL_COMMUNITY): Payer: Self-pay

## 2020-01-30 ENCOUNTER — Observation Stay (HOSPITAL_COMMUNITY): Payer: Medicare Other

## 2020-01-30 DIAGNOSIS — D6959 Other secondary thrombocytopenia: Secondary | ICD-10-CM | POA: Diagnosis present

## 2020-01-30 DIAGNOSIS — E860 Dehydration: Secondary | ICD-10-CM | POA: Diagnosis present

## 2020-01-30 DIAGNOSIS — R55 Syncope and collapse: Secondary | ICD-10-CM | POA: Diagnosis present

## 2020-01-30 DIAGNOSIS — I5082 Biventricular heart failure: Secondary | ICD-10-CM | POA: Diagnosis present

## 2020-01-30 DIAGNOSIS — I130 Hypertensive heart and chronic kidney disease with heart failure and stage 1 through stage 4 chronic kidney disease, or unspecified chronic kidney disease: Secondary | ICD-10-CM | POA: Diagnosis not present

## 2020-01-30 DIAGNOSIS — M549 Dorsalgia, unspecified: Secondary | ICD-10-CM | POA: Diagnosis present

## 2020-01-30 DIAGNOSIS — I252 Old myocardial infarction: Secondary | ICD-10-CM

## 2020-01-30 DIAGNOSIS — K761 Chronic passive congestion of liver: Secondary | ICD-10-CM | POA: Diagnosis present

## 2020-01-30 DIAGNOSIS — B961 Klebsiella pneumoniae [K. pneumoniae] as the cause of diseases classified elsewhere: Secondary | ICD-10-CM | POA: Diagnosis present

## 2020-01-30 DIAGNOSIS — E871 Hypo-osmolality and hyponatremia: Secondary | ICD-10-CM | POA: Diagnosis present

## 2020-01-30 DIAGNOSIS — F419 Anxiety disorder, unspecified: Secondary | ICD-10-CM | POA: Diagnosis present

## 2020-01-30 DIAGNOSIS — Z66 Do not resuscitate: Secondary | ICD-10-CM | POA: Diagnosis not present

## 2020-01-30 DIAGNOSIS — Z9049 Acquired absence of other specified parts of digestive tract: Secondary | ICD-10-CM

## 2020-01-30 DIAGNOSIS — F1120 Opioid dependence, uncomplicated: Secondary | ICD-10-CM | POA: Diagnosis present

## 2020-01-30 DIAGNOSIS — I5041 Acute combined systolic (congestive) and diastolic (congestive) heart failure: Secondary | ICD-10-CM | POA: Diagnosis present

## 2020-01-30 DIAGNOSIS — R531 Weakness: Secondary | ICD-10-CM | POA: Diagnosis not present

## 2020-01-30 DIAGNOSIS — I493 Ventricular premature depolarization: Secondary | ICD-10-CM | POA: Diagnosis present

## 2020-01-30 DIAGNOSIS — N1831 Chronic kidney disease, stage 3a: Secondary | ICD-10-CM | POA: Diagnosis present

## 2020-01-30 DIAGNOSIS — J189 Pneumonia, unspecified organism: Secondary | ICD-10-CM

## 2020-01-30 DIAGNOSIS — I5021 Acute systolic (congestive) heart failure: Secondary | ICD-10-CM

## 2020-01-30 DIAGNOSIS — G8929 Other chronic pain: Secondary | ICD-10-CM | POA: Diagnosis present

## 2020-01-30 DIAGNOSIS — K439 Ventral hernia without obstruction or gangrene: Secondary | ICD-10-CM | POA: Diagnosis present

## 2020-01-30 DIAGNOSIS — E872 Acidosis, unspecified: Secondary | ICD-10-CM

## 2020-01-30 DIAGNOSIS — I509 Heart failure, unspecified: Secondary | ICD-10-CM | POA: Diagnosis not present

## 2020-01-30 DIAGNOSIS — Z885 Allergy status to narcotic agent status: Secondary | ICD-10-CM

## 2020-01-30 DIAGNOSIS — R0902 Hypoxemia: Secondary | ICD-10-CM | POA: Diagnosis not present

## 2020-01-30 DIAGNOSIS — I429 Cardiomyopathy, unspecified: Secondary | ICD-10-CM | POA: Diagnosis present

## 2020-01-30 DIAGNOSIS — L8990 Pressure ulcer of unspecified site, unspecified stage: Secondary | ICD-10-CM | POA: Insufficient documentation

## 2020-01-30 DIAGNOSIS — Z90710 Acquired absence of both cervix and uterus: Secondary | ICD-10-CM

## 2020-01-30 DIAGNOSIS — K5900 Constipation, unspecified: Secondary | ICD-10-CM | POA: Diagnosis not present

## 2020-01-30 DIAGNOSIS — N390 Urinary tract infection, site not specified: Secondary | ICD-10-CM | POA: Diagnosis present

## 2020-01-30 DIAGNOSIS — Z79899 Other long term (current) drug therapy: Secondary | ICD-10-CM

## 2020-01-30 DIAGNOSIS — I471 Supraventricular tachycardia: Secondary | ICD-10-CM | POA: Diagnosis present

## 2020-01-30 DIAGNOSIS — E876 Hypokalemia: Secondary | ICD-10-CM | POA: Diagnosis present

## 2020-01-30 DIAGNOSIS — N179 Acute kidney failure, unspecified: Secondary | ICD-10-CM | POA: Diagnosis present

## 2020-01-30 DIAGNOSIS — F329 Major depressive disorder, single episode, unspecified: Secondary | ICD-10-CM | POA: Diagnosis present

## 2020-01-30 DIAGNOSIS — R296 Repeated falls: Secondary | ICD-10-CM | POA: Diagnosis present

## 2020-01-30 DIAGNOSIS — R1310 Dysphagia, unspecified: Secondary | ICD-10-CM | POA: Diagnosis present

## 2020-01-30 DIAGNOSIS — H9190 Unspecified hearing loss, unspecified ear: Secondary | ICD-10-CM | POA: Diagnosis present

## 2020-01-30 DIAGNOSIS — Z87891 Personal history of nicotine dependence: Secondary | ICD-10-CM

## 2020-01-30 DIAGNOSIS — R627 Adult failure to thrive: Secondary | ICD-10-CM | POA: Diagnosis present

## 2020-01-30 DIAGNOSIS — I50811 Acute right heart failure: Secondary | ICD-10-CM

## 2020-01-30 DIAGNOSIS — R945 Abnormal results of liver function studies: Secondary | ICD-10-CM | POA: Diagnosis present

## 2020-01-30 DIAGNOSIS — L89152 Pressure ulcer of sacral region, stage 2: Secondary | ICD-10-CM | POA: Diagnosis present

## 2020-01-30 DIAGNOSIS — R7989 Other specified abnormal findings of blood chemistry: Secondary | ICD-10-CM

## 2020-01-30 DIAGNOSIS — I081 Rheumatic disorders of both mitral and tricuspid valves: Secondary | ICD-10-CM | POA: Diagnosis present

## 2020-01-30 DIAGNOSIS — E785 Hyperlipidemia, unspecified: Secondary | ICD-10-CM | POA: Diagnosis present

## 2020-01-30 DIAGNOSIS — Z20822 Contact with and (suspected) exposure to covid-19: Secondary | ICD-10-CM | POA: Diagnosis present

## 2020-01-30 DIAGNOSIS — Z86010 Personal history of colonic polyps: Secondary | ICD-10-CM

## 2020-01-30 DIAGNOSIS — I959 Hypotension, unspecified: Secondary | ICD-10-CM | POA: Diagnosis present

## 2020-01-30 LAB — PROTIME-INR
INR: 1.9 — ABNORMAL HIGH (ref 0.8–1.2)
Prothrombin Time: 21.3 seconds — ABNORMAL HIGH (ref 11.4–15.2)

## 2020-01-30 LAB — ACETAMINOPHEN LEVEL: Acetaminophen (Tylenol), Serum: <10 ug/mL — ABNORMAL LOW (ref 10–30)

## 2020-01-30 LAB — BASIC METABOLIC PANEL
Anion gap: 13 (ref 5–15)
BUN: 44 mg/dL — ABNORMAL HIGH (ref 8–23)
CO2: 19 mmol/L — ABNORMAL LOW (ref 22–32)
Calcium: 8.8 mg/dL — ABNORMAL LOW (ref 8.9–10.3)
Chloride: 104 mmol/L (ref 98–111)
Creatinine, Ser: 0.97 mg/dL (ref 0.44–1.00)
GFR calc Af Amer: >60 mL/min (ref >60)
GFR calc non Af Amer: 53 mL/min — ABNORMAL LOW (ref >60)
Glucose, Bld: 113 mg/dL — ABNORMAL HIGH (ref 70–99)
Potassium: 4.7 mmol/L (ref 3.5–5.1)
Sodium: 136 mmol/L (ref 135–145)

## 2020-01-30 LAB — SARS CORONAVIRUS 2 BY RT PCR (HOSPITAL ORDER, PERFORMED IN ~~LOC~~ HOSPITAL LAB): SARS Coronavirus 2: NEGATIVE

## 2020-01-30 LAB — CBC
HCT: 44 % (ref 36.0–46.0)
Hemoglobin: 13.5 g/dL (ref 12.0–15.0)
MCH: 29 pg (ref 26.0–34.0)
MCHC: 30.7 g/dL (ref 30.0–36.0)
MCV: 94.4 fL (ref 80.0–100.0)
Platelets: 129 10*3/uL — ABNORMAL LOW (ref 150–400)
RBC: 4.66 MIL/uL (ref 3.87–5.11)
RDW: 15.6 % — ABNORMAL HIGH (ref 11.5–15.5)
WBC: 8.8 10*3/uL (ref 4.0–10.5)
nRBC: 0.5 % — ABNORMAL HIGH (ref 0.0–0.2)

## 2020-01-30 LAB — TSH: TSH: 1.554 u[IU]/mL (ref 0.350–4.500)

## 2020-01-30 LAB — URINE CULTURE: Culture: >=100000 — AB

## 2020-01-30 LAB — HEPATIC FUNCTION PANEL
ALT: 184 U/L — ABNORMAL HIGH (ref 0–44)
AST: 190 U/L — ABNORMAL HIGH (ref 15–41)
Albumin: 3.4 g/dL — ABNORMAL LOW (ref 3.5–5.0)
Alkaline Phosphatase: 122 U/L (ref 38–126)
Bilirubin, Direct: 1.5 mg/dL — ABNORMAL HIGH (ref 0.0–0.2)
Indirect Bilirubin: 1.2 mg/dL — ABNORMAL HIGH (ref 0.3–0.9)
Total Bilirubin: 2.7 mg/dL — ABNORMAL HIGH (ref 0.3–1.2)
Total Protein: 7.1 g/dL (ref 6.5–8.1)

## 2020-01-30 LAB — BRAIN NATRIURETIC PEPTIDE: B Natriuretic Peptide: 2893.5 pg/mL — ABNORMAL HIGH (ref 0.0–100.0)

## 2020-01-30 LAB — LACTIC ACID, PLASMA: Lactic Acid, Venous: 3.7 mmol/L (ref 0.5–1.9)

## 2020-01-30 LAB — TROPONIN I (HIGH SENSITIVITY): Troponin I (High Sensitivity): 28 ng/L — ABNORMAL HIGH (ref <18)

## 2020-01-30 LAB — CBG MONITORING, ED: Glucose-Capillary: 102 mg/dL — ABNORMAL HIGH (ref 70–99)

## 2020-01-30 MED ORDER — LORAZEPAM 0.5 MG PO TABS
0.5000 mg | ORAL_TABLET | Freq: Three times a day (TID) | ORAL | Status: DC | PRN
  Administered 2020-01-31 – 2020-02-08 (×4): 0.5 mg via ORAL
  Filled 2020-01-30 (×4): qty 1

## 2020-01-30 MED ORDER — SODIUM CHLORIDE 0.9% FLUSH
3.0000 mL | Freq: Two times a day (BID) | INTRAVENOUS | Status: DC
  Administered 2020-01-30: 3 mL via INTRAVENOUS

## 2020-01-30 MED ORDER — SODIUM CHLORIDE 0.9% FLUSH: 3.0000 mL | INTRAVENOUS | Status: DC | PRN

## 2020-01-30 MED ORDER — SODIUM CHLORIDE 0.9% FLUSH
3.0000 mL | Freq: Once | INTRAVENOUS | Status: AC
  Administered 2020-01-30: 3 mL via INTRAVENOUS

## 2020-01-30 MED ORDER — ALBUTEROL SULFATE (2.5 MG/3ML) 0.083% IN NEBU: 2.5000 mg | INHALATION_SOLUTION | RESPIRATORY_TRACT | Status: DC | PRN

## 2020-01-30 MED ORDER — OXYCODONE-ACETAMINOPHEN 5-325 MG PO TABS
1.0000 | ORAL_TABLET | Freq: Four times a day (QID) | ORAL | Status: DC | PRN
  Administered 2020-01-30 – 2020-02-07 (×14): 1 via ORAL
  Filled 2020-01-30 (×14): qty 1

## 2020-01-30 MED ORDER — SODIUM CHLORIDE 0.9% FLUSH: 3.0000 mL | Freq: Two times a day (BID) | INTRAVENOUS | Status: DC

## 2020-01-30 MED ORDER — FUROSEMIDE 10 MG/ML IJ SOLN
40.0000 mg | Freq: Once | INTRAMUSCULAR | Status: AC
  Administered 2020-01-30: 40 mg via INTRAVENOUS
  Filled 2020-01-30: qty 4

## 2020-01-30 MED ORDER — SODIUM CHLORIDE 0.9 % IV SOLN
2.0000 g | Freq: Once | INTRAVENOUS | Status: AC
  Administered 2020-01-30: 2 g via INTRAVENOUS
  Filled 2020-01-30: qty 20

## 2020-01-30 MED ORDER — POLYETHYLENE GLYCOL 3350 17 G PO PACK
17.0000 g | PACK | Freq: Every day | ORAL | Status: DC | PRN
  Administered 2020-02-02 – 2020-02-03 (×2): 17 g via ORAL
  Filled 2020-01-30 (×2): qty 1

## 2020-01-30 MED ORDER — SODIUM CHLORIDE 0.9 % IV BOLUS
500.0000 mL | Freq: Once | INTRAVENOUS | Status: AC
  Administered 2020-01-30: 500 mL via INTRAVENOUS

## 2020-01-30 MED ORDER — SODIUM CHLORIDE 0.9 % IV SOLN
1.0000 g | INTRAVENOUS | Status: DC
  Administered 2020-01-31 – 2020-02-01 (×2): 1 g via INTRAVENOUS
  Filled 2020-01-30 (×2): qty 10

## 2020-01-30 MED ORDER — SODIUM CHLORIDE 0.9 % IV SOLN: 250.0000 mL | INTRAVENOUS | Status: DC | PRN

## 2020-01-30 NOTE — H&P (Addendum)
History and Physical    Anita Martinez KZS:[id number] DOB: 15-Mar-1933 DOA: 01/30/2020  PCP: [name]   I have briefly reviewed patients previous medical reports in [facility].  Patient coming from: Home  Chief Complaint: Generalized weakness, unable to stand up or walk, dyspnea on exertion  HPI: Anita Martinez is a 86 year old female, lives alone, very independent, drove herself around until a week ago, PMH of extreme hard of hearing, unwilling to use hearing aids, chronic back pain, opioid dependent, anxiety and?  Depression, hyperlipidemia, presented to the [facility] ED with above complaints.  Patient is a poor historian.  History is obtained from patient and mostly from patient's daughter at bedside.  Patient was in her usual state of health until approximately 8 to 9 days ago.  On that Sunday, she returned from church extremely tired and just sat in her chair.  Daughter did not hear any significant complaints for the next 2 days.  Patient drove herself to her pain MDs appointment on 01/24/2020, reportedly no new medications were started.  However the next day on 5/5, patient missed her nutrition appointment which she keeps every Thursday and has done so for several years.  This was very unusual for her.  Same day she also went to drop off check to somebody and had extreme dyspnea on exertion.  By 5/7, daughter noted that patient was weak, tired, not eating much, not doing much and hence brought her to the ED.  She underwent extensive evaluation.  CTA chest was negative for PE.  BNP was elevated.  There was concern for new onset heart failure, dehydration, possible UTI.  Admission was recommended however patient was adamant to go home.  She was given a prescription for Keflex and information to call cardiology this morning.  Patient left AMA.  Patient started to take her antibiotics.  However today when daughter went to look for patient, patient was extremely weak,  unable to get out of her chair, crawled to the front door to open it for her and had to be helped up.  She thereby brought her to the ED for further evaluation and management.  Currently complains of thirst.  Patient completed her second dose of COVID-19 Pfizer vaccine on January 07, 2020.  Patient and daughter deny near syncope or syncope, dizziness or lightheadedness just extreme weakness.  ED Course: Afebrile, not tachypneic.  Persistent tachycardia in the 120s.  Blood pressure normal.  Not hypoxic.  CMP significant for bicarbonate 19, BUN 44, calcium 8.8, albumin 3.4, AST 190, ALT 184, total bilirubin 2.7, direct bilirubin 1.5, indirect Ruben 1.2.  BNP 2893.5.  Troponin 25 > 28.  Lactate 3.7 up from 2.2, 3 days ago.  CBC significant for platelets of 129.  INR up to 1.9.  Urine microscopy from 2 days ago showed many bacteria, 21-50 WBCs.  Urine culture had shown Klebsiella pneumonia sensitive to ceftriaxone.  Blood cultures pending.  CT head: Generalized cerebral atrophy.  No acute intracranial abnormality. Chest x-ray: Findings suggesting CHF with mild edema and bilateral pleural effusions.  I personally reviewed images.  May have cardiomegaly as well. CTA chest from 5/7: No evidence of pulmonary embolism.  Findings consistent with CHF with moderate bilateral pleural effusions and scattered groundglass airspace disease.  Nodularity described over the right anterior first rib on chest x-ray corresponds to patchy airspace disease in confluence of shadows based on CT findings.  No underlying pulmonary nodule or mass.  Aortic atherosclerosis.  Review of  Systems:  All other systems reviewed and apart from HPI, are negative.  Past Medical History:  Diagnosis Date  . Chronic back pain   . Colon polyps   . Colonic diverticular abscess     Past Surgical History:  Procedure Laterality Date  . ABDOMINAL HYSTERECTOMY    . BLADDER SUSPENSION      Social History  reports that she quit smoking about 51  years ago. She started smoking about 65 years ago. She has a 14.00 pack-year smoking history. She has never used smokeless tobacco. She reports that she does not drink alcohol or use drugs.  Allergies  Allergen Reactions  . Morphine And Related     Makes hyper and unable to sleep    History reviewed. No pertinent family history.   Prior to Admission medications   Medication Sig Start Date End Date Taking? Authorizing Provider  amitriptyline (ELAVIL) 10 MG tablet Take 10 mg by mouth at bedtime.   Yes [provider]  atorvastatin (LIPITOR) 10 MG tablet Take 10 mg by mouth daily. 01/08/20  Yes [provider]  butalbital-acetaminophen-caffeine (FIORICET, ESGIC) 50-325-40 MG per tablet Take 1 tablet by mouth 2 (two) times daily as needed for headache.   Yes [provider]  cephALEXin (KEFLEX) 250 MG capsule Take 1 capsule (250 mg total) by mouth 4 (four) times daily for 5 days. 01/27/20 02/01/20 Yes Caccavale, Sophia, PA-C  fish oil-omega-3 fatty acids 1000 MG capsule Take 1 g by mouth 2 (two) times daily.   Yes [provider]  oxyCODONE-acetaminophen (PERCOCET/ROXICET) 5-325 MG tablet Take 1 tablet by mouth every 6 (six) hours as needed for moderate pain or severe pain.  01/23/20  Yes [provider]  LORazepam (ATIVAN) 0.5 MG tablet Take 0.5 mg by mouth 3 (three) times daily as needed for anxiety.  01/18/20   [provider]    Physical Exam: Vitals:   01/30/20 1617 01/30/20 1618 01/30/20 1619 01/30/20 1630  BP:    117/74  Pulse: (!) 124 (!) 124 (!) 122 (!) 122  Resp: 19 (!) 24 19 19   Temp:      TempSrc:      SpO2: 97% 99% 96% 99%      Constitutional: Pleasant elderly female, small built, thinly nourished, frail lying comfortably propped up in bed without distress. Eyes: PERTLA, lids and conjunctivae normal ENMT: Mucous membranes are borderline hydration with tongue coated but no thrush. Posterior pharynx clear of any exudate or  lesions.  Missing multiple teeth.  Neck: supple, no masses, no thyromegaly Respiratory: Slightly diminished breath sounds in the bases with scattered few bibasal crackles.  Rest of lung fields clear to auscultation. Cardiovascular: S1 and S2 heard, regular tachycardia.  No JVD, loud 3/6 systolic ejection murmur best heard at apex.  Trace bilateral ankle edema.  Also has trace right upper arm edema near elbow and may be some on the left.   Abdomen: Non distended. Non tender. Soft. No organomegaly or masses appreciated. No clinical Ascites. Normal bowel sounds heard.  Patient has a incisional, uncomplicated suprapubic hernia. Musculoskeletal: no clubbing / cyanosis. No joint deformity upper and lower extremities. Good ROM, no contractures. Normal muscle tone.  Skin: no rashes, lesions, ulcers. No induration Neurologic: CN 2-12 grossly intact. Sensation intact, DTR normal. Strength 5/5 in all 4 limbs.  Psychiatric: Impaired judgment and insight. Alert and oriented x 2. Normal mood.  Extremely hard of hearing.    Labs on Admission: I have personally reviewed following labs  and imaging studies  CBC: Recent Labs  Lab 01/27/20 1454 01/30/20 1336  WBC 9.4 8.8  HGB 12.7 13.5  HCT 41.2 44.0  MCV 94.9 94.4  PLT 188 129*    Basic Metabolic Panel: Recent Labs  Lab 01/27/20 1455 01/30/20 1336  NA 139 136  K 4.3 4.7  CL 103 104  CO2 22 19*  GLUCOSE 116* 113*  BUN 34* 44*  CREATININE 0.99 0.97  CALCIUM 9.4 8.8*    Liver Function Tests: Recent Labs  Lab 01/27/20 1455 01/30/20 1336  AST 279* 190*  ALT 231* 184*  ALKPHOS 98 122  BILITOT 1.7* 2.7*  PROT 7.3 7.1  ALBUMIN 3.6 3.4*    Urine analysis:    Component Value Date/Time   COLORURINE AMBER (A) 01/27/2020 1758   APPEARANCEUR HAZY (A) 01/27/2020 1758   LABSPEC 1.026 01/27/2020 1758   PHURINE 5.0 01/27/2020 1758   GLUCOSEU NEGATIVE 01/27/2020 1758   HGBUR SMALL (A) 01/27/2020 1758   BILIRUBINUR NEGATIVE 01/27/2020 [place] 01/27/2020 1758   PROTEINUR 100 (A) 01/27/2020 1758   NITRITE NEGATIVE 01/27/2020 1758   LEUKOCYTESUR TRACE (A) 01/27/2020 1758     Radiological Exams on Admission: CT Head Wo Contrast  Result Date: 01/30/2020 CLINICAL DATA:  Generalized weakness. EXAM: CT HEAD WITHOUT CONTRAST TECHNIQUE: Contiguous axial images were obtained from the base of the skull through the vertex without intravenous contrast. COMPARISON:  Jan 27, 2020 FINDINGS: Brain: There is mild cerebral atrophy with widening of the extra-axial spaces and ventricular dilatation. There are areas of decreased attenuation within the white matter tracts of the supratentorial brain, consistent with microvascular disease changes. Vascular: No hyperdense vessel or unexpected calcification. Skull: Normal. Negative for fracture or focal lesion. Sinuses/Orbits: No acute finding. Other: None. IMPRESSION: 1. Generalized cerebral atrophy. 2. No acute intracranial abnormality. Electronically Signed   By: Virgina Norfolk M.D.   On: 01/30/2020 15:37   DG Chest Portable 1 View  Result Date: 01/30/2020 CLINICAL DATA:  Shortness of breath EXAM: PORTABLE CHEST 1 VIEW COMPARISON:  01/27/2020 FINDINGS: Stable cardiomegaly. Atherosclerotic calcification of the aortic knob. Small bilateral pleural effusions with associated bibasilar opacities. Bilateral interstitial prominence suggesting mild edema. No pneumothorax is seen. IMPRESSION: Findings suggesting CHF with mild edema and bilateral pleural effusions. Electronically Signed   By: Davina Poke D.O.   On: 01/30/2020 14:23    EKG: Independently reviewed.  Sinus tachycardia at 101 bpm, low voltage, normal axis, Q waves in leads V1-3, slow R wave progression and QTC 514 ms.  Assessment/Plan Principal Problem:   Acute CHF (congestive heart failure) (HCC)     Suspected acute CHF,?  Systolic?  Right heart failure: As evidenced by dyspnea on exertion, CTA chest and chest x-ray  indicating pulmonary edema, elevated BNP, EKG with sinus tachycardia and low voltage, loud murmur on exam.  Check TTE.  Lasix 40 mg IV x1 dose and then reassess in a.m. regarding need for further Lasix.  Strict intake and output.  Check TSH.  Sinus tachycardia: Unclear etiology.?  Related to CHF.  Check TSH.  Monitor on telemetry.  Abnormal LFTs: Unclear etiology.  CHF does not seem to be that florid to cause passive hepatic congestion but certainly possible.  Elevated INR is concerning.  Will check RUQ ultrasound, acute hepatitis panel and trend LFTs.  Thrombocytopenia: Unclear etiology.  Trend daily CBCs.  Asymptomatic bacteriuria versus Klebsiella acute cystitis: Patient has positive urinary symptoms but her generalized weakness may be related to this.  Recently given Keflex but started on IV ceftriaxone here, continue.  Elevated lactate: Unclear etiology.  Apart from tachycardia, does not have other features suggestive of ongoing sepsis.?  Related to liver dysfunction.  Trend lactate.  Prolonged QTC: Minimize QT prolonging medications.  Monitor on telemetry and follow EKG closely.  Chronic pain/opioid dependence: Continue home regimen for now but may need to consider dropping of Tylenol from it.  Check acetaminophen level although very low suspicion for accidental OD.  Hyperlipidemia: Continue statins.  Anxiety and depression: Hold amitriptyline which she apparently has not taken for some days may be.  Ativan as needed.   DVT prophylaxis: SCDs Code Status: Full, confirmed with patient in the presence of her daughter. Family Communication: Discussed in detail with patient's daughter at bedside, updated care and answered all questions. Disposition Plan:   Patient is from:  Home  Anticipated DC to:  Home  Anticipated DC date:  01/31/2020 pending improvement but seems less likely.  May have to change to inpatient status.  Anticipated DC barriers: Acute CHF, LFT abnormalities, suspected  UTI.   Consults called: None Admission status: Telemetry/observation  Severity of Illness: The appropriate patient status for this patient is OBSERVATION. Observation status is judged to be reasonable and necessary in order to provide the required intensity of service to ensure the patient's safety. The patient's presenting symptoms, physical exam findings, and initial radiographic and laboratory data in the context of their medical condition is felt to place them at decreased risk for further clinical deterioration. Furthermore, it is anticipated that the patient will be medically stable for discharge from the hospital within 2 midnights of admission. The following factors support the patient status of observation.   " The patient's presenting symptoms include generalized weakness, unable to get up to even ambulate, dyspnea on exertion. " The physical exam findings include sinus tachycardia, reduced breath sounds and basal crackles on lung exam, ankle and upper extremity edema. " The initial radiographic and laboratory data are chest x-ray and recent CTA chest suggestive of CHF, abnormal LFTs, elevated lactate low platelets.      Marcellus Scott MD [facility]  To contact the attending provider between 7A-7P or the covering provider during after hours 7P-7A, please log into the web site [email or web address] and access using universal [place] password for that web site. If you do not have the password, please call the hospital operator.  01/30/2020, 5:18 PM

## 2020-01-30 NOTE — ED Triage Notes (Addendum)
Patient arrived via [facility] from home (lives with daughter and granddaughter).    Called out for generalized weakness since Friday. Patient came to ED on Friday and diagnosed with heart failure and UTI patient was suppose to be admitted but left AMA.   Pt states she has had multiple syncopal  episodes since Friday and falls.   Pt has been treating UTI PO antibiotics    BP-82 palpated initially tachy-126 EMS gave 1000 mls NS New BP-113/60 CBG-127 RR-20 20 g left FA   Pt also states last time she was here she got medicine through right forearm and pt is concerned that vein is hard now.

## 2020-01-30 NOTE — ED Notes (Addendum)
Patient repositioned in bed. Daughter at bedside.

## 2020-01-30 NOTE — ED Provider Notes (Signed)
[facility]-EMERGENCY DEPT Provider Note   CSN: [id number] Arrival date & time: 01/30/20  1301     History Chief Complaint  Patient presents with  . Weakness  . Near Syncope    Anita Martinez is a 86 y.o. female.  The history is provided by the patient and a caregiver.  Weakness Severity:  Moderate Onset quality:  Gradual Timing:  Constant Progression:  Worsening Chronicity:  New Context: recent infection (Diagnosed with UTI a few days ago, left AMA. Patient with low blood pressure with EMS, increased falls during this time. Possible new HF at last ED visit.)   Relieved by:  Nothing Worsened by:  Nothing Associated symptoms: lethargy, loss of consciousness (near syncope) and shortness of breath   Associated symptoms: no abdominal pain, no arthralgias, no chest pain, no cough, no dizziness, no dysuria, no fever, no nausea, no seizures and no vomiting        Past Medical History:  Diagnosis Date  . Chronic back pain   . Colon polyps   . Colonic diverticular abscess     Patient Active Problem List   Diagnosis Date Noted  . HA (headache) 06/25/2013  . Chronic back pain 06/25/2013    Past Surgical History:  Procedure Laterality Date  . ABDOMINAL HYSTERECTOMY    . BLADDER SUSPENSION       OB History   No obstetric history on file.     History reviewed. No pertinent family history.  Social History   Tobacco Use  . Smoking status: Former Smoker    Packs/day: 1.00    Years: 14.00    Pack years: 14.00    Start date: 56    Quit date: 1970    Years since quitting: 51.3  . Smokeless tobacco: Never Used  Substance Use Topics  . Alcohol use: No  . Drug use: No    Home Medications Prior to Admission medications   Medication Sig Start Date End Date Taking? Authorizing Provider  amitriptyline (ELAVIL) 10 MG tablet Take 10 mg by mouth at bedtime.    [provider]  aspirin EC 81 MG tablet Take 81 mg by mouth daily.     [provider]  butalbital-acetaminophen-caffeine (FIORICET, ESGIC) 50-325-40 MG per tablet Take 1 tablet by mouth 2 (two) times daily as needed for headache.    [provider]  cephALEXin (KEFLEX) 250 MG capsule Take 1 capsule (250 mg total) by mouth 4 (four) times daily for 5 days. 01/27/20 02/01/20  Caccavale, Sophia, PA-C  fish oil-omega-3 fatty acids 1000 MG capsule Take 1 g by mouth 2 (two) times daily.    [provider]  HYDROcodone-acetaminophen (NORCO) 10-325 MG per tablet Take by mouth every 4 (four) hours as needed for pain.     [provider]  LORazepam (ATIVAN) 1 MG tablet Take 1 mg by mouth every 8 (eight) hours.    [provider]  valACYclovir (VALTREX) 500 MG tablet TAKE 1 TABLET BY MOUTH TWICE DAILY 07/06/14   Ethelda Chick, MD    Allergies    Morphine and related  Review of Systems   Review of Systems  Constitutional: Negative for chills and fever.  HENT: Negative for ear pain and sore throat.   Eyes: Negative for pain and visual disturbance.  Respiratory: Positive for shortness of breath. Negative for cough.   Cardiovascular: Negative for chest pain and palpitations.  Gastrointestinal: Negative for abdominal pain, nausea and vomiting.  Genitourinary: Negative for dysuria  and hematuria.  Musculoskeletal: Negative for arthralgias and back pain.  Skin: Negative for color change and rash.  Neurological: Positive for loss of consciousness (near syncope), weakness and light-headedness. Negative for dizziness, seizures, syncope and facial asymmetry.  All other systems reviewed and are negative.   Physical Exam Updated Vital Signs  ED Triage Vitals  Enc Vitals Group     BP 01/30/20 1324 110/70     Pulse Rate 01/30/20 1324 98     Resp 01/30/20 1324 (!) 25     Temp 01/30/20 1324 97.8 F (36.6 C)     Temp Source 01/30/20 1324 Oral     SpO2 01/30/20 1324 100 %     Weight --      Height --      Head Circumference --       Peak Flow --      Pain Score 01/30/20 1332 0     Pain Loc --      Pain Edu? --      Excl. in GC? --     Physical Exam Vitals and nursing note reviewed.  Constitutional:      General: She is not in acute distress.    Appearance: She is well-developed.  HENT:     Head: Normocephalic and atraumatic.     Nose: Nose normal.     Mouth/Throat:     Mouth: Mucous membranes are moist.  Eyes:     Extraocular Movements: Extraocular movements intact.     Conjunctiva/sclera: Conjunctivae normal.     Pupils: Pupils are equal, round, and reactive to light.  Cardiovascular:     Rate and Rhythm: Regular rhythm. Tachycardia present.     Pulses: Normal pulses.     Heart sounds: Normal heart sounds. No murmur.  Pulmonary:     Effort: Pulmonary effort is normal. No respiratory distress.     Comments: Diminished breath sounds  Abdominal:     Palpations: Abdomen is soft.     Tenderness: There is no abdominal tenderness.  Musculoskeletal:     Cervical back: Normal range of motion and neck supple.     Right lower leg: No edema.     Left lower leg: No edema.  Skin:    General: Skin is warm and dry.     Capillary Refill: Capillary refill takes less than 2 seconds.  Neurological:     General: No focal deficit present.     Mental Status: She is alert.  Psychiatric:        Mood and Affect: Mood normal.     ED Results / Procedures / Treatments   Labs (all labs ordered are listed, but only abnormal results are displayed) Labs Reviewed  BASIC METABOLIC PANEL - Abnormal; Notable for the following components:      Result Value   CO2 19 (*)    Glucose, Bld 113 (*)    BUN 44 (*)    Calcium 8.8 (*)    GFR calc non Af Amer 53 (*)    All other components within normal limits  CBC - Abnormal; Notable for the following components:   RDW 15.6 (*)    Platelets 129 (*)    nRBC 0.5 (*)    All other components within normal limits  BRAIN NATRIURETIC PEPTIDE - Abnormal; Notable for the following  components:   B Natriuretic Peptide 2,893.5 (*)    All other components within normal limits  LACTIC ACID, PLASMA - Abnormal; Notable for the following components:  Lactic Acid, Venous 3.7 (*)    All other components within normal limits  CBG MONITORING, ED - Abnormal; Notable for the following components:   Glucose-Capillary 102 (*)    All other components within normal limits  TROPONIN I (HIGH SENSITIVITY) - Abnormal; Notable for the following components:   Troponin I (High Sensitivity) 28 (*)    All other components within normal limits  URINE CULTURE  CULTURE, BLOOD (ROUTINE X 2)  CULTURE, BLOOD (ROUTINE X 2)  SARS CORONAVIRUS 2 BY RT PCR (HOSPITAL ORDER, PERFORMED IN [facility] LAB)  URINALYSIS, ROUTINE W REFLEX MICROSCOPIC  LACTIC ACID, PLASMA  HEPATIC FUNCTION PANEL  PROTIME-INR    EKG EKG Interpretation  Date/Time:  Monday Jan 30 2020 13:23:54 EDT Ventricular Rate:  101 PR Interval:    QRS Duration: 80 QT Interval:  396 QTC Calculation: 514 R Axis:   111 Text Interpretation: Sinus tachycardia Atrial premature complexes Anterior infarct, old Nonspecific T abnormalities, lateral leads Prolonged QT interval Confirmed by Virgina Norfolk [phone]) on 01/30/2020 1:42:28 PM   Radiology DG Chest Portable 1 View  Result Date: 01/30/2020 CLINICAL DATA:  Shortness of breath EXAM: PORTABLE CHEST 1 VIEW COMPARISON:  01/27/2020 FINDINGS: Stable cardiomegaly. Atherosclerotic calcification of the aortic knob. Small bilateral pleural effusions with associated bibasilar opacities. Bilateral interstitial prominence suggesting mild edema. No pneumothorax is seen. IMPRESSION: Findings suggesting CHF with mild edema and bilateral pleural effusions. Electronically Signed   By: Duanne Guess D.O.   On: 01/30/2020 14:23    Procedures Procedures (including critical care time)  Medications Ordered in ED Medications  sodium chloride flush (NS) 0.9 % injection 3 mL (3 mLs  Intravenous Given 01/30/20 1331)  cefTRIAXone (ROCEPHIN) 2 g in sodium chloride 0.9 % 100 mL IVPB (0 g Intravenous Stopped 01/30/20 1454)  sodium chloride 0.9 % bolus 500 mL (0 mLs Intravenous Stopped 01/30/20 1435)    ED Course  I have reviewed the triage vital signs and the nursing notes.  Pertinent labs & imaging results that were available during my care of the patient were reviewed by me and considered in my medical decision making (see chart for details).    MDM Rules/Calculators/A&P                      Anita Martinez is an 86 year old female with history of chronic back pain who presents to the ED with weakness, syncope.  Patient left AMA a few days ago after being found to have urinary tract infection.  According to note possibly new heart failure.  Patient has been with family who states that she has been getting increasingly weak.  Having more falls.  She supposedly has been taking her antibiotics.  Patient not on a blood thinner.  Did not think she hit her head.  Patient neurologically appears intact.  Does not appear to have peripheral edema or rales on exam.  Does not look overly tachypneic but does feel some shortness of breath.  EKG appears to show more sinus tachycardia.  No obvious arrhythmia.  Will get lab work to include infectious labs including blood cultures given that she is recently diagnosed with a UTI.  It appears that she grew E. coli in her urine culture.  Has been on Keflex.  We will give her IV Rocephin.  Initially ordered a small normal saline bolus given hypotension in the field however blood pressure is now improved.  Chest x-ray actually shows signs of volume overload and given  that her BNP was elevated several days ago she possibly does have some new heart failure.  Anticipate admission once lab work is back.  Chest x-ray with signs of volume overload.  BNP about 3000.  Troponin mildly elevated.  Patient with no chest pain.  Of note liver function was mildly elevated  several days ago as well.  Will recheck liver function and INR.  Lactic acid was 3.7.  However patient with no fever, no leukocytosis.  Seems less likely lactic acidosis is related to sepsis.  Will talk with medicine about trending lactic acid and holding further IV fluids given concern for heart failure.  Suspect this is why she has been weak recently.  Will defer fluid management to medicine at this time given that she is hemodynamically stable.  To be admitted for further care.  Has been vaccinated for coronavirus.  This chart was dictated using voice recognition software.  Despite best efforts to proofread,  errors can occur which can change the documentation meaning.    Final Clinical Impression(s) / ED Diagnoses Final diagnoses:  Lower urinary tract infectious disease  Acute right-sided heart failure ([place])  Lactic acidosis    Rx / DC Orders ED Discharge Orders    None       Lennice Sites, DO 01/30/20 1509

## 2020-01-30 NOTE — ED Notes (Signed)
Daughter at bedside.

## 2020-01-30 NOTE — ED Notes (Addendum)
First set of cultures collected and sent to lab in save tabs. No orders at this time.

## 2020-01-30 NOTE — ED Notes (Signed)
MD at bedside delaying transport.

## 2020-01-30 NOTE — Progress Notes (Signed)
PIV consult: No indication for additional site at this time. Please have phlebotomy draw labs.

## 2020-01-30 NOTE — ED Notes (Signed)
Unable to pull off EMS IV for blood.  Patient already stuck by EMS X3.   IV team order placed for IV and blood work.

## 2020-01-31 ENCOUNTER — Observation Stay (HOSPITAL_COMMUNITY): Payer: Medicare Other

## 2020-01-31 ENCOUNTER — Other Ambulatory Visit: Payer: Self-pay

## 2020-01-31 ENCOUNTER — Telehealth: Payer: Self-pay | Admitting: *Deleted

## 2020-01-31 ENCOUNTER — Inpatient Hospital Stay (HOSPITAL_COMMUNITY): Payer: Medicare Other

## 2020-01-31 DIAGNOSIS — F1120 Opioid dependence, uncomplicated: Secondary | ICD-10-CM | POA: Diagnosis present

## 2020-01-31 DIAGNOSIS — E785 Hyperlipidemia, unspecified: Secondary | ICD-10-CM | POA: Diagnosis present

## 2020-01-31 DIAGNOSIS — L89152 Pressure ulcer of sacral region, stage 2: Secondary | ICD-10-CM | POA: Diagnosis present

## 2020-01-31 DIAGNOSIS — Z66 Do not resuscitate: Secondary | ICD-10-CM | POA: Diagnosis not present

## 2020-01-31 DIAGNOSIS — B961 Klebsiella pneumoniae [K. pneumoniae] as the cause of diseases classified elsewhere: Secondary | ICD-10-CM | POA: Diagnosis present

## 2020-01-31 DIAGNOSIS — E871 Hypo-osmolality and hyponatremia: Secondary | ICD-10-CM | POA: Diagnosis present

## 2020-01-31 DIAGNOSIS — E872 Acidosis: Secondary | ICD-10-CM | POA: Diagnosis present

## 2020-01-31 DIAGNOSIS — Z20822 Contact with and (suspected) exposure to covid-19: Secondary | ICD-10-CM | POA: Diagnosis present

## 2020-01-31 DIAGNOSIS — N1831 Chronic kidney disease, stage 3a: Secondary | ICD-10-CM | POA: Diagnosis present

## 2020-01-31 DIAGNOSIS — N390 Urinary tract infection, site not specified: Secondary | ICD-10-CM | POA: Diagnosis present

## 2020-01-31 DIAGNOSIS — R531 Weakness: Secondary | ICD-10-CM | POA: Diagnosis present

## 2020-01-31 DIAGNOSIS — I340 Nonrheumatic mitral (valve) insufficiency: Secondary | ICD-10-CM | POA: Diagnosis not present

## 2020-01-31 DIAGNOSIS — I959 Hypotension, unspecified: Secondary | ICD-10-CM | POA: Diagnosis present

## 2020-01-31 DIAGNOSIS — I509 Heart failure, unspecified: Secondary | ICD-10-CM | POA: Diagnosis not present

## 2020-01-31 DIAGNOSIS — N1832 Chronic kidney disease, stage 3b: Secondary | ICD-10-CM | POA: Diagnosis not present

## 2020-01-31 DIAGNOSIS — D6959 Other secondary thrombocytopenia: Secondary | ICD-10-CM | POA: Diagnosis present

## 2020-01-31 DIAGNOSIS — R1310 Dysphagia, unspecified: Secondary | ICD-10-CM | POA: Diagnosis present

## 2020-01-31 DIAGNOSIS — I5041 Acute combined systolic (congestive) and diastolic (congestive) heart failure: Secondary | ICD-10-CM | POA: Diagnosis present

## 2020-01-31 DIAGNOSIS — K761 Chronic passive congestion of liver: Secondary | ICD-10-CM | POA: Diagnosis present

## 2020-01-31 DIAGNOSIS — I130 Hypertensive heart and chronic kidney disease with heart failure and stage 1 through stage 4 chronic kidney disease, or unspecified chronic kidney disease: Secondary | ICD-10-CM | POA: Diagnosis present

## 2020-01-31 DIAGNOSIS — R55 Syncope and collapse: Secondary | ICD-10-CM | POA: Diagnosis not present

## 2020-01-31 DIAGNOSIS — I50811 Acute right heart failure: Secondary | ICD-10-CM | POA: Diagnosis not present

## 2020-01-31 DIAGNOSIS — N179 Acute kidney failure, unspecified: Secondary | ICD-10-CM | POA: Diagnosis present

## 2020-01-31 DIAGNOSIS — I5021 Acute systolic (congestive) heart failure: Secondary | ICD-10-CM | POA: Diagnosis not present

## 2020-01-31 DIAGNOSIS — E876 Hypokalemia: Secondary | ICD-10-CM | POA: Diagnosis present

## 2020-01-31 DIAGNOSIS — I471 Supraventricular tachycardia: Secondary | ICD-10-CM | POA: Diagnosis present

## 2020-01-31 DIAGNOSIS — L8990 Pressure ulcer of unspecified site, unspecified stage: Secondary | ICD-10-CM | POA: Insufficient documentation

## 2020-01-31 DIAGNOSIS — R945 Abnormal results of liver function studies: Secondary | ICD-10-CM | POA: Diagnosis present

## 2020-01-31 DIAGNOSIS — F329 Major depressive disorder, single episode, unspecified: Secondary | ICD-10-CM | POA: Diagnosis present

## 2020-01-31 DIAGNOSIS — Z9049 Acquired absence of other specified parts of digestive tract: Secondary | ICD-10-CM | POA: Diagnosis not present

## 2020-01-31 DIAGNOSIS — R627 Adult failure to thrive: Secondary | ICD-10-CM | POA: Diagnosis present

## 2020-01-31 LAB — COMPREHENSIVE METABOLIC PANEL
ALT: 197 U/L — ABNORMAL HIGH (ref 0–44)
AST: 237 U/L — ABNORMAL HIGH (ref 15–41)
Albumin: 3.1 g/dL — ABNORMAL LOW (ref 3.5–5.0)
Alkaline Phosphatase: 113 U/L (ref 38–126)
Anion gap: 13 (ref 5–15)
BUN: 47 mg/dL — ABNORMAL HIGH (ref 8–23)
CO2: 18 mmol/L — ABNORMAL LOW (ref 22–32)
Calcium: 8.5 mg/dL — ABNORMAL LOW (ref 8.9–10.3)
Chloride: 102 mmol/L (ref 98–111)
Creatinine, Ser: 0.96 mg/dL (ref 0.44–1.00)
GFR calc Af Amer: >60 mL/min (ref >60)
GFR calc non Af Amer: 54 mL/min — ABNORMAL LOW (ref >60)
Glucose, Bld: 116 mg/dL — ABNORMAL HIGH (ref 70–99)
Potassium: 4.5 mmol/L (ref 3.5–5.1)
Sodium: 133 mmol/L — ABNORMAL LOW (ref 135–145)
Total Bilirubin: 1.9 mg/dL — ABNORMAL HIGH (ref 0.3–1.2)
Total Protein: 6.3 g/dL — ABNORMAL LOW (ref 6.5–8.1)

## 2020-01-31 LAB — URINALYSIS, ROUTINE W REFLEX MICROSCOPIC
Bilirubin Urine: NEGATIVE
Glucose, UA: NEGATIVE mg/dL
Hgb urine dipstick: NEGATIVE
Ketones, ur: NEGATIVE mg/dL
Leukocytes,Ua: NEGATIVE
Nitrite: NEGATIVE
Protein, ur: NEGATIVE mg/dL
Specific Gravity, Urine: 1.013 (ref 1.005–1.030)
pH: 5 (ref 5.0–8.0)

## 2020-01-31 LAB — ECHOCARDIOGRAM COMPLETE
Height: 65 in
Weight: 1648 oz

## 2020-01-31 LAB — HEPATITIS PANEL, ACUTE
HCV Ab: <0.1 s/co ratio — AB (ref 0.0–0.9)
Hep A IgM: NEGATIVE — AB
Hep B C IgM: NEGATIVE — AB
Hepatitis B Surface Ag: NEGATIVE — AB

## 2020-01-31 LAB — CBC
HCT: 42.1 % (ref 36.0–46.0)
Hemoglobin: 13.1 g/dL (ref 12.0–15.0)
MCH: 28.9 pg (ref 26.0–34.0)
MCHC: 31.1 g/dL (ref 30.0–36.0)
MCV: 92.7 fL (ref 80.0–100.0)
Platelets: 133 10*3/uL — ABNORMAL LOW (ref 150–400)
RBC: 4.54 MIL/uL (ref 3.87–5.11)
RDW: 15.7 % — ABNORMAL HIGH (ref 11.5–15.5)
WBC: 10.5 10*3/uL (ref 4.0–10.5)
nRBC: 0.6 % — ABNORMAL HIGH (ref 0.0–0.2)

## 2020-01-31 LAB — SODIUM, URINE, RANDOM: Sodium, Ur: 64 mmol/L

## 2020-01-31 LAB — CREATININE, URINE, RANDOM: Creatinine, Urine: 41.85 mg/dL

## 2020-01-31 LAB — MRSA PCR SCREENING: MRSA by PCR: NEGATIVE

## 2020-01-31 LAB — GLUCOSE, CAPILLARY: Glucose-Capillary: 124 mg/dL — ABNORMAL HIGH (ref 70–99)

## 2020-01-31 MED ORDER — METOPROLOL TARTRATE 5 MG/5ML IV SOLN
INTRAVENOUS | Status: AC
  Filled 2020-01-31: qty 10

## 2020-01-31 MED ORDER — CHLORHEXIDINE GLUCONATE CLOTH 2 % EX PADS
6.0000 | MEDICATED_PAD | Freq: Every day | CUTANEOUS | Status: DC
  Administered 2020-01-31 – 2020-02-08 (×9): 6 via TOPICAL

## 2020-01-31 MED ORDER — SACUBITRIL-VALSARTAN 24-26 MG PO TABS
1.0000 | ORAL_TABLET | Freq: Two times a day (BID) | ORAL | Status: DC
  Filled 2020-01-31 (×2): qty 1

## 2020-01-31 MED ORDER — FUROSEMIDE 20 MG PO TABS: 20.0000 mg | ORAL_TABLET | Freq: Every day | ORAL | Status: DC

## 2020-01-31 MED ORDER — METOPROLOL SUCCINATE ER 25 MG PO TB24
12.5000 mg | ORAL_TABLET | Freq: Every day | ORAL | Status: DC
  Administered 2020-01-31: 12.5 mg via ORAL
  Filled 2020-01-31: qty 1

## 2020-01-31 MED ORDER — DIGOXIN 125 MCG PO TABS
0.1250 mg | ORAL_TABLET | Freq: Every day | ORAL | Status: DC
  Administered 2020-01-31 – 2020-02-08 (×9): 0.125 mg via ORAL
  Filled 2020-01-31 (×10): qty 1

## 2020-01-31 MED ORDER — FUROSEMIDE 10 MG/ML IJ SOLN
40.0000 mg | Freq: Two times a day (BID) | INTRAMUSCULAR | Status: DC
  Administered 2020-01-31 – 2020-02-03 (×7): 40 mg via INTRAVENOUS
  Filled 2020-01-31 (×7): qty 4

## 2020-01-31 MED ORDER — ONDANSETRON HCL 4 MG/2ML IJ SOLN
4.0000 mg | Freq: Four times a day (QID) | INTRAMUSCULAR | Status: DC | PRN
  Administered 2020-02-07: 4 mg via INTRAVENOUS
  Filled 2020-01-31: qty 2

## 2020-01-31 MED ORDER — METOPROLOL TARTRATE 25 MG PO TABS
12.5000 mg | ORAL_TABLET | Freq: Two times a day (BID) | ORAL | Status: DC
  Administered 2020-01-31 – 2020-02-08 (×15): 12.5 mg via ORAL
  Filled 2020-01-31 (×16): qty 1

## 2020-01-31 MED ORDER — METOPROLOL TARTRATE 5 MG/5ML IV SOLN
10.0000 mg | Freq: Once | INTRAVENOUS | Status: AC
  Administered 2020-01-31: 10 mg via INTRAVENOUS

## 2020-01-31 NOTE — Progress Notes (Signed)
  Echocardiogram 2D Echocardiogram has been performed.  Anita Martinez 01/31/2020, 6:35 PM

## 2020-01-31 NOTE — Progress Notes (Addendum)
MD verbal order for STAT ECHO 2D to be completed. Order placed and page sent to [facility] team. On-call cardiologist Dr. Jacinto Halim OK'd STAT ECHO order for after hours reading, will contact him when ECHO is completed. [facility] ECHO team notified of this.

## 2020-01-31 NOTE — Progress Notes (Signed)
PT Cancellation Note  Patient Details Name: Anita Martinez MRN: [id number] DOB: 08/23/1933   Cancelled Treatment:    Reason Eval/Treat Not Completed: Medical issues which prohibited therapy, moving to SDU.   [name] 01/31/2020, 12:25 PMZ Blanchard Kelch PT Acute Rehabilitation Services Pager [phone] Office [phone]

## 2020-01-31 NOTE — Progress Notes (Signed)
This note also relates to the following rows which could not be included: ECG Heart Rate - Cannot attach notes to unvalidated device data Resp - Cannot attach notes to unvalidated device data    01/31/20 0600  Assess: MEWS Score  Level of Consciousness Alert  SpO2 100 %  O2 Device Room Air  O2 Flow Rate (L/min) 2 L/min  Assess: MEWS Score  MEWS Temp 0  MEWS Systolic 0  MEWS Pulse 3  MEWS RR 0  MEWS LOC 0  MEWS Score 3  MEWS Score Color Yellow  from 0435am to 0620am this morning pt had a HR verified by EKG as SVT 155-160s. Metoprolol 10mg  was ordered by Dr however 5mg  was given ov a [place] Flakes timespan and pt spontaneously converted to NSR mid 80s.  Bladder scan was also done showing >450 ml, as pt had minimal urine output after 40mg  IV lasix on prior evening shift. Pt was not comfortable using purewick and had been assisted up to bsc for a void.  I&O cath by RR nurse Janelle,collected urine. UA sent from this collection.

## 2020-01-31 NOTE — Progress Notes (Signed)
Rapid Response Event Note Called at 0540 Arrived at 0544 Cardiac  Overview: Called d/t pt HR is 160's   Initial Focused Assessment: Pt lying in bed denying pain.  A/O and f/c.  Pt talking in normal tone.  Pt has 2 L [place].  No SOB, noted.  Pt abdomen seems slightly distended.    Interventions: Pt placed on RRT monitor.  See chart for VS.  [facility] paged per pt RN,  New orders received by pt primary RN.  Pt CBG 124.  EKG revealed SVT 160's.  Bladder scan shows pt has >500 cc in bladder.  I/O cath 375cc out.  Pt given 5 mg IV lopressor (10 mg ordered) per MD.    Plan of Care (if not transferred): Pt HR decreased to 80's and SBP in the 90's after 5 mg IV lopressor was given.  Pt denies any discomfort at this time.  Pt will remain on tele floor on monitor for now.  Pt primary nurse will continue to monitor and report.  Pt MD made aware.  Please call  If there are any more issues. [phone] call complete.   Conley Rolls

## 2020-01-31 NOTE — Progress Notes (Signed)
0430 central Tele called to report pts HR in 150's. Pt is laying awake in bed, asking to use the toilet as she has not voided since 1900 or before last PM. States she has not slept all night but really has slept. Pt states she did not get her Elavil last PM. Reports pain 9/10- given Percocet & Ativan and assisted to [facility] to void. Pt aspirated the water she was drinking to take her 2 pills. Sat pt up at 90degree in bed Amion text page sent to [facility]

## 2020-01-31 NOTE — Progress Notes (Signed)
MD gave orders for step down.  Bed request placed   Patient transferred to room 1233 with report called and at bedside to [facility] [place] .  Daughter at bedside.    Levora Angel, RN

## 2020-01-31 NOTE — Consult Note (Addendum)
CARDIOLOGY CONSULT NOTE  Patient ID: Anita Martinez MRN: [id number] DOB/AGE: Oct 12, 1932 86 y.o.  Admit date: 01/30/2020 Referring Physician  Pleas Koch, MD Primary Physician:  [name] Reason for Consultation  CHF and tachycardia  Patient ID: Anita Martinez, female    DOB: [birth year], 86 y.o.   MRN: [id number]  Chief Complaint  Patient presents with  . Weakness  . Near Syncope   HPI:    Anita Martinez  is a 86 y.o. Caucasian female with chronic back pain, anxiety, hyperlipidemia admitted to the hospital with generalized weakness and failure to thrive, patient lives independently was doing well and stable until a week ago, was seen in the emergency room with generalized weakness at that point it was felt she was probably in congestive heart failure and also probably UTI, was recommended admission to the hospital but patient walked out AMA.  She was readmitted to the hospital yesterday with worsening fatigue, dizziness, generalized weakness and inability to walk even within the house and her daughter found her in a state where she was unable to walk on her feet and hence called EMS and was brought to the emergency room, initial evaluation suggested markedly elevated BNP, sinus tachycardia and probably combination of urosepsis and acute decompensated heart failure.  Echocardiogram was ordered and also patient was started on IV diuretics, I was called for consultation regarding tachycardia and dyspnea.  Patient denies chest pain or dyspnea or palpitations.  Her only complaint is marked generalized weakness and dizziness.  Her granddaughter is present at the bedside.  Past Medical History:  Diagnosis Date  . Chronic back pain   . Colon polyps   . Colonic diverticular abscess    Past Surgical History:  Procedure Laterality Date  . ABDOMINAL HYSTERECTOMY    . BLADDER SUSPENSION     Social History   Socioeconomic History  . Marital status: Widowed    Spouse name: Not on  file  . Number of children: Not on file  . Years of education: Not on file  . Highest education level: Not on file  Occupational History  . Not on file  Tobacco Use  . Smoking status: Former Smoker    Packs/day: 1.00    Years: 14.00    Pack years: 14.00    Start date: 93    Quit date: 1970    Years since quitting: 51.3  . Smokeless tobacco: Never Used  Substance and Sexual Activity  . Alcohol use: No  . Drug use: No  . Sexual activity: Not on file  Other Topics Concern  . Not on file  Social History Narrative  . Not on file   Social Determinants of Health   Financial Resource Strain:   . Difficulty of Paying Living Expenses:   Food Insecurity:   . Worried About Programme researcher, broadcasting/film/video in the Last Year:   . Barista in the Last Year:   Transportation Needs:   . Freight forwarder (Medical):   Marland Kitchen Lack of Transportation (Non-Medical):   Physical Activity:   . Days of Exercise per Week:   . Minutes of Exercise per Session:   Stress:   . Feeling of Stress :   Social Connections:   . Frequency of Communication with Friends and Family:   . Frequency of Social Gatherings with Friends and Family:   . Attends Religious Services:   . Active Member of Clubs or Organizations:   . Attends Banker Meetings:   .  Marital Status:   Intimate Partner Violence:   . Fear of Current or Ex-Partner:   . Emotionally Abused:   Marland Kitchen Physically Abused:   . Sexually Abused:    ROS  Review of Systems  Constitution: Positive for malaise/fatigue.  Cardiovascular: Negative for chest pain, dyspnea on exertion and leg swelling.  Gastrointestinal: Negative for melena.  Neurological: Positive for light-headedness.  All other systems reviewed and are negative.  Objective   Blood pressure 102/79, pulse (!) 114, temperature (!) 96.5 F (35.8 C), temperature source Oral, resp. rate 15, height 5\' 5"  (1.651 m), weight 46.7 kg, SpO2 96 %.  Vitals with BMI 01/31/2020 01/31/2020  01/31/2020  Height - - 5\' 5"   Weight - - -  BMI - - -  Systolic 102 104 -  Diastolic 79 73 -  Pulse - 114 04/01/2020      Physical Exam  Constitutional:  She is petite and frail, no acute distress  Cardiovascular: Regular rhythm and intact distal pulses. Tachycardia present. Exam reveals no gallop.  Murmur heard. High-pitched blowing midsystolic murmur is present with a grade of 2/6 at the apex. No leg edema, no JVD.  Pulmonary/Chest: Effort normal. She has rales (bilateral basal coarse crackles).  Abdominal: Soft. Bowel sounds are normal.   Laboratory examination:   Recent Labs    01/27/20 1455 01/30/20 1336 01/31/20 0613  NA 139 136 133*  K 4.3 4.7 4.5  CL 103 104 102  CO2 22 19* 18*  GLUCOSE 116* 113* 116*  BUN 34* 44* 47*  CREATININE 0.99 0.97 0.96  CALCIUM 9.4 8.8* 8.5*  GFRNONAA 52* 53* 54*  GFRAA >60 >60 >60   estimated creatinine clearance is 31.6 mL/min (by C-G formula based on SCr of 0.96 mg/dL).  CMP Latest Ref Rng & Units 01/31/2020 01/30/2020 01/27/2020  Glucose 70 - 99 mg/dL 03/31/2020) 03/28/2020) 761(Y)  BUN 8 - 23 mg/dL 073(X) 106(Y) 69(S)  Creatinine 0.44 - 1.00 mg/dL 85(I 62(V 0.35  Sodium 135 - 145 mmol/L 133(L) 136 139  Potassium 3.5 - 5.1 mmol/L 4.5 4.7 4.3  Chloride 98 - 111 mmol/L 102 104 103  CO2 22 - 32 mmol/L 18(L) 19(L) 22  Calcium 8.9 - 10.3 mg/dL 0.09) 3.81) 9.4  Total Protein 6.5 - 8.1 g/dL 6.3(L) 7.1 7.3  Total Bilirubin 0.3 - 1.2 mg/dL 8.2(X) 2.7(H) 1.7(H)  Alkaline Phos 38 - 126 U/L 113 122 98  AST 15 - 41 U/L 237(H) 190(H) 279(H)  ALT 0 - 44 U/L 197(H) 184(H) 231(H)   CBC Latest Ref Rng & Units 01/31/2020 01/30/2020 01/27/2020  WBC 4.0 - 10.5 K/uL 10.5 8.8 9.4  Hemoglobin 12.0 - 15.0 g/dL 03/31/2020 03/28/2020 67.8  Hematocrit 36.0 - 46.0 % 42.1 44.0 41.2  Platelets 150 - 400 K/uL 133(L) 129(L) 188   Lipid Panel  No results found for: CHOL, TRIG, HDL, CHOLHDL, VLDL, LDLCALC, LDLDIRECT HEMOGLOBIN A1C No results found for: HGBA1C, MPG TSH Recent Labs     01/30/20 1338  TSH 1.554   BNP (last 3 results) Recent Labs    01/27/20 1454 01/30/20 1336  BNP 2,299.1* 2,893.5*    Ref Range & Units 1 d ago  (01/30/20) 4 d ago  (01/27/20) 4 d ago  (01/27/20)  Troponin I (High Sensitivity) <18 ng/L 28High   25High  CM  24High  CM   Comment: (NOTE)         Medications and allergies   Allergies  Allergen Reactions  . Morphine And Related  Makes hyper and unable to sleep     . cefTRIAXone (ROCEPHIN)  IV      Current Outpatient Medications  Medication Instructions  . amitriptyline (ELAVIL) 10 mg, Oral, Daily at bedtime  . atorvastatin (LIPITOR) 10 mg, Oral, Daily  . butalbital-acetaminophen-caffeine (FIORICET, ESGIC) 50-325-40 MG per tablet 1 tablet, Oral, 2 times daily PRN  . cephALEXin (KEFLEX) 250 mg, Oral, 4 times daily  . fish oil-omega-3 fatty acids 1 g, 2 times daily  . LORazepam (ATIVAN) 0.5 mg, Oral, 3 times daily PRN  . oxyCODONE-acetaminophen (PERCOCET/ROXICET) 5-325 MG tablet 1 tablet, Oral, Every 6 hours PRN   Scheduled Meds: . Chlorhexidine Gluconate Cloth  6 each Topical Daily  . [START ON 02/01/2020] furosemide  20 mg Oral Daily  . metoprolol succinate  12.5 mg Oral Daily  . metoprolol tartrate      . metoprolol tartrate  12.5 mg Oral BID   Continuous Infusions: . cefTRIAXone (ROCEPHIN)  IV Stopped (01/31/20 1518)   PRN Meds:.albuterol, LORazepam, oxyCODONE-acetaminophen, polyethylene glycol   I/O last 3 completed shifts: In: 368.1 [P.O.:120; IV Piggyback:248.1] Out: 395 [Urine:395] Total I/O In: 240 [P.O.:240] Out: -    Radiology:   Imaging: CT Head Wo Contrast  Result Date: 01/30/2020 CLINICAL DATA:  Generalized weakness. EXAM: CT HEAD WITHOUT CONTRAST TECHNIQUE: Contiguous axial images were obtained from the base of the skull through the vertex without intravenous contrast. COMPARISON:  Jan 27, 2020 FINDINGS: Brain: There is mild cerebral atrophy with widening of the extra-axial spaces and ventricular  dilatation. There are areas of decreased attenuation within the white matter tracts of the supratentorial brain, consistent with microvascular disease changes. Vascular: No hyperdense vessel or unexpected calcification. Skull: Normal. Negative for fracture or focal lesion. Sinuses/Orbits: No acute finding. Other: None. IMPRESSION: 1. Generalized cerebral atrophy. 2. No acute intracranial abnormality. Electronically Signed   By: Aram Candela M.D.   On: 01/30/2020 15:37   US RENAL  Result Date: 01/31/2020 CLINICAL DATA:  Acute kidney injury.  History of bladder suspension EXAM: RENAL / URINARY TRACT ULTRASOUND COMPLETE COMPARISON:  None. FINDINGS: Right Kidney: Renal measurements: 8.7 x 3.5 x 5.1 cm = volume: 81 mL . Echogenicity within normal limits. No mass or hydronephrosis visualized. Left Kidney: Renal measurements: 9.3 x 4.2 x 4.0 cm = volume: 81.1 mL. Echogenicity within normal limits. No mass. Pelvocaliectasis noted. Bladder: Contains of Foley catheter.  Otherwise unremarkable. Other: Bilateral pleural effusions noted.  Gallbladder sludge present. IMPRESSION: 1. Small bilateral kidneys. No evidence for high-grade obstructive uropathy. 2. Left pelvocaliectasis. Electronically Signed   By: Signa Kell M.D.   On: 01/31/2020 12:00   DG CHEST PORT 1 VIEW  Result Date: 01/31/2020 CLINICAL DATA:  Follow-up pneumonia EXAM: PORTABLE CHEST 1 VIEW COMPARISON:  01/30/2019 FINDINGS: Cardiomegaly, vascular congestion. Improving bilateral airspace disease. Continued bilateral lower lobe consolidation, left greater than right. No visible significant effusions or pneumothorax. IMPRESSION: Improving bilateral airspace disease with residual bibasilar opacities. Electronically Signed   By: Charlett Nose M.D.   On: 01/31/2020 13:14   DG Chest Portable 1 View  Result Date: 01/30/2020 CLINICAL DATA:  Shortness of breath EXAM: PORTABLE CHEST 1 VIEW COMPARISON:  01/27/2020 FINDINGS: Stable cardiomegaly.  Atherosclerotic calcification of the aortic knob. Small bilateral pleural effusions with associated bibasilar opacities. Bilateral interstitial prominence suggesting mild edema. No pneumothorax is seen. IMPRESSION: Findings suggesting CHF with mild edema and bilateral pleural effusions. Electronically Signed   By: Duanne Guess D.O.   On: 01/30/2020 14:23   US  Abdomen Limited RUQ  Result Date: 01/30/2020 CLINICAL DATA:  Abnormal LFTs EXAM: ULTRASOUND ABDOMEN LIMITED RIGHT UPPER QUADRANT COMPARISON:  None. FINDINGS: Gallbladder: Gallbladder is well distended with gallbladder sludge within. No definitive cholelithiasis is seen. No wall thickening or pericholecystic fluid is noted. Common bile duct: Diameter: 8 mm which is within normal limits for the patient's given age. Liver: No focal lesion identified. Within normal limits in parenchymal echogenicity. Portal vein is patent on color Doppler imaging with normal direction of blood flow towards the liver. Other: Small right pleural effusion is noted. IMPRESSION: Gallbladder sludge without cholelithiasis. Small right pleural effusion. Electronically Signed   By: Inez Catalina M.D.   On: 01/30/2020 18:47   CT angiogram chest 01/27/2020: Cardiovascular: This is a technically adequate evaluation of the pulmonary vasculature. There are no filling defects or pulmonary emboli.  Heart is mildly enlarged, with prominent biatrial dilatation, right greater than left. Reflux of contrast into the hepatic veins suggest a degree of cardiac dysfunction.  Thoracic aorta demonstrates normal caliber, with extensive atherosclerosis.  Mediastinum/Nodes: No enlarged mediastinal, hilar, or axillary lymph nodes. Thyroid gland, trachea, and esophagus demonstrate no significant findings.  Cardiac Studies:   EKG 01/21/2020: Sinus tachycardia at rate of 113 bpm, Right atrial enlargement, normal axis, poor R wave progression, cannot exclude anteroseptal infarct old.   Borderline criteria for LVH.  Nonspecific T abnormality.  Telemetry: Independently reviewed, 01/31/2020: Sinus tachycardia from 111 beats 230 bpm, rare PACs.  Rare atrial couplets.  Assessment   1.  Acute decompensated heart failure, in the absence of S3, most probably acute diastolic heart failure although BNP suggest systolic heart failure and EKG does reveal possible old anteroseptal infarct. 2.  CT findings suggestive of pulmonary hypertension with right atrial enlargement and hepatic reflux of contrast.  No clinical evidence of right-sided heart failure, no JVD, no hepatomegaly, no leg edema. 3.  Sinus tachycardia 4.  Moderate bilateral pleural effusions 5.  Abnormal LFTs 6.  Stage IIIa chronic kidney disease  Recommendations:  Do not suspect sepsis as etiology, she does not look septic, no temperature, no significant WBC elevation and urine analysis appears stable.  Suspect CHF, await echocardiogram to be performed to direct our medical therapy.  EKG reveals sinus tachycardia.  Do not suspect SVT or atrial tachycardia or atrial fibrillation.  May be related to underlying diastolic heart failure.  I will make my final recommendation after the echocardiogram.  Although she has moderate bilateral pleural effusion, saturations seem to have been well maintained and no acute respiratory distress.  Presence of prominent P wave on the EKG, CT findings of right atrial enlargement, hepatic enzyme abnormalities suggest a component of right-sided heart failure as well.  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  Adrian Prows, MD, [facility] 01/31/2020, 3:58 PM [facility] Pager: [phone] Office: [phone]   Addendum:  Echocardiogram revealed severe LV systolic dysfunction, biventricular heart failure, severe MR and moderately severe TR, findings consistent with cardiomyopathy. We will start her on digoxin for now, Entresto low-dose, follow-up on BMP.  Continue metoprolol tartrate  12.5 mg p.o. twice daily.  Switch her to IV Lasix.  Extremely poor prognosis hopefully she will turn around but we will eventually have to address CODE STATUS with the family and the patient, although patient very active and lives independently up to now.  Adrian Prows, MD, [facility] 01/31/2020, 8:07 PM

## 2020-01-31 NOTE — Progress Notes (Addendum)
PROGRESS NOTE    Anita Martinez  YQI:[id number] DOB: 01-Aug-1933 DOA: 01/30/2020 PCP: [name]    Chief Complaint  Patient presents with  . Weakness  . 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 Order stat echo--Dr Jacinto Halim to read and he will see in consult 2. hold Lasix keep volumes even right now 3. check urine output after placement of Foley 4. Daily weights  ?  Aspiration 1. tells me has been coughing past couple of weeks probably since the fifth-chronology of events seems to be that she declined acutely after 01/25/2020-we will repeat CXR this a.m. after use of incentive spirometer as there is a possible left lung base airspace infiltrate  AKI 1. My exam is not concerning for overt edema 2. Keep volumes even 3. CXR as above 4. Foley was placed as risk for obstructive uropathy-patient has a large hernia that is nonoperable in her abdomen which may be causing pressure on the bladder 5. Ultrasound renal ensure no hydronephrosis  Pulmonary nodule? 1. Needs x-ray in 4 weeks  Metabolic acidosis bicarb 18 1. As above 2. Would hold any further fluids at this time 3.  monitor labs again at 1300  Transaminitis AST 197 AST 237 DDx cirrhosis versus CVC liver secondary to right-sided failure 1. Gallbladder shows sludge on ultrasound 2. Cycle LFTs-given no overt abdominal pain would hold on speaking to surgery  Pressure  ulcer Prior colonic abscess    DVT prophylaxis: scd Code Status: Full code Family Communication: Extensive discussion with daughter this morning and then subsequently this evening on the stepdown unit  Disposition:   Status is: Inpatient  The patient will require care spanning > 2 midnights and should be moved to inpatient because: Hemodynamically unstable, Persistent severe electrolyte disturbances and Unsafe d/c plan  Dispo: The patient is from: Home              Anticipated d/c is to: SNF              Anticipated d/c date is: 3 days              Patient currently is not medically stable to d/c. Consultants:   Cardiology Dr. Jacinto Halim 5/11  Procedures: None  Antimicrobials: None   Subjective: I was notified patient was not looking well this morning It appears overnight flipped into SVT and by overnight coverage was given one-time dose of metoprolol IV When I saw her this morning she was quite sleepy appearing was on oxygen My reevaluation of this patient at around 2:30 PM revealed that she was much better on the stepdown unit her heart rate had resolved to some degree on metoprolol XL 12.5 which was started this morning and I decided to dose her with 12.5 twice daily subsequently Her daughter has been maintaining constant anxious vigil at the bedside  Objective: Vitals:   01/30/20 2128 01/31/20 0142 01/31/20 0437 01/31/20 0600  BP: 95/67 112/64 112/80   Pulse: (!) 105 98 (!)  155   Resp: 20 20 20    Temp: 97.6 F (36.4 C) (!) 97.5 F (36.4 C) 97.7 F (36.5 C)   TempSrc:      SpO2: 98% 95% 93% 100%  Weight:   46.7 kg     Intake/Output Summary (Last 24 hours) at 01/31/2020 0801 Last data filed at 01/31/2020 0600 Gross per 24 hour  Intake 368.14 ml  Output 395 ml  Net -26.86 ml   Filed Weights   01/31/20 0437  Weight: 46.7 kg    Examination: Frail General exam: Somewhat cachectic pleasant but a lot more oriented than she had been previously-mucosa is  moist Respiratory system: Clinically clear Cardiovascular system: S1-S2 sinus tachycardia RRR on monitors from this morning and noticed SVT but then it looks like she is flipped into sinus rhythm.today with sinus tach predominantly I compared the EKGs from this morning As below Gastrointestinal system: Soft nontender no rebound. Central nervous system: Intact no focal deficit Extremities: No edema Skin: She does seem euvolemic she does not seem dry Psychiatry: Euthymic more oriented in the afternoon than this morning    Data Reviewed: I have personally reviewed following labs and imaging studies Sodium down from 1 36-1 33 Bicarb 18 BUN/creatinine up from 34/0.99-->47/0.9 LFTs back up to 237/197 Bilirubin 1.9 down from 2.7 Platelet count 133   Radiology Studies: CT Head Wo Contrast  Result Date: 01/30/2020 CLINICAL DATA:  Generalized weakness. EXAM: CT HEAD WITHOUT CONTRAST TECHNIQUE: Contiguous axial images were obtained from the base of the skull through the vertex without intravenous contrast. COMPARISON:  Jan 27, 2020 FINDINGS: Brain: There is mild cerebral atrophy with widening of the extra-axial spaces and ventricular dilatation. There are areas of decreased attenuation within the white matter tracts of the supratentorial brain, consistent with microvascular disease changes. Vascular: No hyperdense vessel or unexpected calcification. Skull: Normal. Negative for fracture or focal lesion. Sinuses/Orbits: No acute finding. Other: None. IMPRESSION: 1. Generalized cerebral atrophy. 2. No acute intracranial abnormality. Electronically Signed   By: Virgina Norfolk M.D.   On: 01/30/2020 15:37   DG Chest Portable 1 View  Result Date: 01/30/2020 CLINICAL DATA:  Shortness of breath EXAM: PORTABLE CHEST 1 VIEW COMPARISON:  01/27/2020 FINDINGS: Stable cardiomegaly. Atherosclerotic calcification of the aortic knob. Small bilateral pleural effusions with associated bibasilar opacities. Bilateral  interstitial prominence suggesting mild edema. No pneumothorax is seen. IMPRESSION: Findings suggesting CHF with mild edema and bilateral pleural effusions. Electronically Signed   By: Davina Poke D.O.   On: 01/30/2020 14:23   US Abdomen Limited RUQ  Result Date: 01/30/2020 CLINICAL DATA:  Abnormal LFTs EXAM: ULTRASOUND ABDOMEN LIMITED RIGHT UPPER QUADRANT COMPARISON:  None. FINDINGS: Gallbladder: Gallbladder is well distended with gallbladder sludge within. No definitive cholelithiasis is seen. No wall thickening or pericholecystic fluid is noted. Common bile duct: Diameter: 8 mm which is within normal limits for the patient's given age. Liver: No focal lesion identified. Within normal limits in parenchymal echogenicity. Portal vein is patent on color Doppler imaging with normal direction of blood flow towards the liver. Other: Small right pleural effusion is noted. IMPRESSION: Gallbladder sludge without cholelithiasis. Small right pleural effusion. Electronically Signed   By: Inez Catalina M.D.   On: 01/30/2020 18:47      Scheduled Meds: . metoprolol tartrate       Continuous Infusions: . cefTRIAXone (ROCEPHIN)  IV       LOS: 0 days    Time spent: 75 total minutes with inclusive of critical care time  CRITICAL CARE Performed by: Rhetta Mura   Total critical care time: 45 minutes  Critical care time was exclusive of separately billable procedures and treating other patients.  Critical care was necessary to treat or prevent imminent or life-threatening deterioration.  Critical care was time spent personally by me on the following activities: development of treatment plan with patient and/or surrogate as well as nursing, discussions with consultants, evaluation of patient's response to treatment, examination of patient, obtaining history from patient or surrogate, ordering and performing treatments and interventions, ordering and review of laboratory studies, ordering and  review of radiographic studies, pulse oximetry and re-evaluation of patient's condition.   Rhetta Mura, MD [facility]   To contact the attending provider between 7A-7P or the covering provider during after hours 7P-7A, please log into the web site [email or web address] and access using universal [place] password for that web site. If you do not have the password, please call the hospital operator.  01/31/2020, 8:01 AM

## 2020-01-31 NOTE — Telephone Encounter (Signed)
Post ED Visit - Positive Culture Follow-up  Culture report reviewed by antimicrobial stewardship pharmacist: [facility] []  , Pharm.D. []  Enzo Bi, Pharm.D., BCPS AQ-ID []  , Pharm.D., BCPS []  Celedonio Miyamoto, Pharm.D., BCPS []  [place], Garvin Fila.D., BCPS, AAHIVP []  , Pharm.D., BCPS, AAHIVP []  Georgina Pillion, PharmD, BCPS []  , PharmD, BCPS []  [place], PharmD, BCPS []  [address], PharmD []  , PharmD, BCPS []  Estella Husk, PharmD  Pharmacy Team []  Lysle Pearl, PharmD []  , PharmD []  Phillips Climes, PharmD []  , Rph []  Agapito Games) , PharmD []  Verlan Friends, PharmD []  , PharmD []  Mervyn Gay, PharmD []  , PharmD []  Vinnie Level, PharmD []  [facility], PharmD []  , PharmD [x]  Len Childs, PharmD   Positive urine culture Treated with Cephalexin, organism sensitive to the same and no further patient follow-up is required at this time.  [facility] 01/31/2020, 10:31 AM

## 2020-01-31 NOTE — Progress Notes (Signed)
   01/31/20 0837  Assess: MEWS Score  Pulse Rate (!) 115  Assess: MEWS Score  MEWS Temp 0  MEWS Systolic 1  MEWS Pulse 2  MEWS RR 0  MEWS LOC 0  MEWS Score 3  MEWS Score Color Yellow   Working with MD to reduce patient's heart rate at this time.  Levora Angel, RN

## 2020-01-31 NOTE — Progress Notes (Signed)
MD notified of soft blood pressures with admission for acute heart failure. Holding off on bolus due to this diagnosis.  Awaiting orders from MD. Last BP increased to 95/63. Will pass on to nightshift RN and continue to monitor.

## 2020-02-01 DIAGNOSIS — N179 Acute kidney failure, unspecified: Secondary | ICD-10-CM

## 2020-02-01 DIAGNOSIS — I5021 Acute systolic (congestive) heart failure: Secondary | ICD-10-CM

## 2020-02-01 DIAGNOSIS — N1832 Chronic kidney disease, stage 3b: Secondary | ICD-10-CM

## 2020-02-01 DIAGNOSIS — I340 Nonrheumatic mitral (valve) insufficiency: Secondary | ICD-10-CM

## 2020-02-01 LAB — COMPREHENSIVE METABOLIC PANEL
ALT: 358 U/L — ABNORMAL HIGH (ref 0–44)
AST: 656 U/L — ABNORMAL HIGH (ref 15–41)
Albumin: 3 g/dL — ABNORMAL LOW (ref 3.5–5.0)
Alkaline Phosphatase: 149 U/L — ABNORMAL HIGH (ref 38–126)
Anion gap: 13 (ref 5–15)
BUN: 57 mg/dL — ABNORMAL HIGH (ref 8–23)
CO2: 20 mmol/L — ABNORMAL LOW (ref 22–32)
Calcium: 8.4 mg/dL — ABNORMAL LOW (ref 8.9–10.3)
Chloride: 99 mmol/L (ref 98–111)
Creatinine, Ser: 1.36 mg/dL — ABNORMAL HIGH (ref 0.44–1.00)
GFR calc Af Amer: 41 mL/min — ABNORMAL LOW (ref >60)
GFR calc non Af Amer: 35 mL/min — ABNORMAL LOW (ref >60)
Glucose, Bld: 123 mg/dL — ABNORMAL HIGH (ref 70–99)
Potassium: 4.9 mmol/L (ref 3.5–5.1)
Sodium: 132 mmol/L — ABNORMAL LOW (ref 135–145)
Total Bilirubin: 2 mg/dL — ABNORMAL HIGH (ref 0.3–1.2)
Total Protein: 6.2 g/dL — ABNORMAL LOW (ref 6.5–8.1)

## 2020-02-01 LAB — CBC WITH DIFFERENTIAL/PLATELET
Abs Immature Granulocytes: 0.08 10*3/uL — ABNORMAL HIGH (ref 0.00–0.07)
Basophils Absolute: 0 10*3/uL (ref 0.0–0.1)
Basophils Relative: 0 %
Eosinophils Absolute: 0 10*3/uL (ref 0.0–0.5)
Eosinophils Relative: 0 %
HCT: 42.3 % (ref 36.0–46.0)
Hemoglobin: 13.4 g/dL (ref 12.0–15.0)
Immature Granulocytes: 1 %
Lymphocytes Relative: 13 %
Lymphs Abs: 1.5 10*3/uL (ref 0.7–4.0)
MCH: 29 pg (ref 26.0–34.0)
MCHC: 31.7 g/dL (ref 30.0–36.0)
MCV: 91.6 fL (ref 80.0–100.0)
Monocytes Absolute: 1.1 10*3/uL — ABNORMAL HIGH (ref 0.1–1.0)
Monocytes Relative: 9 %
Neutro Abs: 9 10*3/uL — ABNORMAL HIGH (ref 1.7–7.7)
Neutrophils Relative %: 77 %
Platelets: 79 10*3/uL — ABNORMAL LOW (ref 150–400)
RBC: 4.62 MIL/uL (ref 3.87–5.11)
RDW: 15.7 % — ABNORMAL HIGH (ref 11.5–15.5)
WBC: 11.6 10*3/uL — ABNORMAL HIGH (ref 4.0–10.5)
nRBC: 0.4 % — ABNORMAL HIGH (ref 0.0–0.2)

## 2020-02-01 LAB — URINE CULTURE: Culture: NO GROWTH

## 2020-02-01 LAB — PROTIME-INR
INR: 2.7 — ABNORMAL HIGH (ref 0.8–1.2)
Prothrombin Time: 27.7 seconds — ABNORMAL HIGH (ref 11.4–15.2)

## 2020-02-01 MED ORDER — ADULT MULTIVITAMIN W/MINERALS CH
1.0000 | ORAL_TABLET | Freq: Every day | ORAL | Status: DC
  Administered 2020-02-01 – 2020-02-08 (×8): 1 via ORAL
  Filled 2020-02-01 (×8): qty 1

## 2020-02-01 MED ORDER — ENSURE ENLIVE PO LIQD
237.0000 mL | Freq: Two times a day (BID) | ORAL | Status: DC
  Administered 2020-02-01 – 2020-02-08 (×7): 237 mL via ORAL

## 2020-02-01 MED ORDER — JUVEN PO PACK
1.0000 | PACK | Freq: Two times a day (BID) | ORAL | Status: DC
  Administered 2020-02-01 – 2020-02-05 (×3): 1 via ORAL
  Filled 2020-02-01 (×8): qty 1

## 2020-02-01 MED ORDER — SILDENAFIL CITRATE 20 MG PO TABS
20.0000 mg | ORAL_TABLET | Freq: Three times a day (TID) | ORAL | Status: DC
  Administered 2020-02-01 – 2020-02-05 (×13): 20 mg via ORAL
  Filled 2020-02-01 (×14): qty 1

## 2020-02-01 NOTE — Progress Notes (Signed)
Patient's BP was a little soft overnight; nightshift RN held PM metoprolol. This RN checked with cardiology, and verbal orders given to give AM digoxin and metoprolol; Ollen Barges will be discontinued.   This RN also obtained an EKG this AM d/t frequent changes between NSR, and ST, with what looked to be intervals of irregular beats. EKG showed 'sinus rhythm with marked sinus arrhythmia'. Dr. Jacinto Halim notified by RN, and orders given to continue to monitor pt d/t atrial tachycardia.

## 2020-02-01 NOTE — TOC Initial Note (Signed)
Transition of Care [facility]) - Initial/Assessment Note    Patient Details  Name: Anita Martinez MRN: [id number] Date of Birth: November 27, 1932  Transition of Care [facility]) CM/SW Contact:    Golda Acre, RN Phone Number: 02/01/2020, 9:03 AM  Clinical Narrative:                 Acute congestive heart failure/from home and lives alone/temp=97.5, iv rocephin, bun-57.creat-1.36, wbc 11.6, liver values are increased.  Alert and reactive.  Expected Discharge Plan: Home/Self Care Barriers to Discharge: Continued Medical Work up   Patient Goals and CMS Choice Patient states their goals for this hospitalization and ongoing recovery are:: to go back home CMS Medicare.gov Compare Post Acute Care list provided to:: Patient    Expected Discharge Plan and Services Expected Discharge Plan: Home/Self Care   Discharge Planning Services: CM Consult   Living arrangements for the past 2 months: Single Family Home                                      Prior Living Arrangements/Services Living arrangements for the past 2 months: Single Family Home Lives with:: Self Patient language and need for interpreter reviewed:: No Do you feel safe going back to the place where you live?: Yes      Need for Family Participation in Patient Care: Yes (Comment) Care giver support system in place?: Yes (comment)   Criminal Activity/Legal Involvement Pertinent to Current Situation/Hospitalization: No - Comment as needed  Activities of Daily Living Home Assistive Devices/Equipment: Dentures (specify type), Eyeglasses(full set dentures) ADL Screening (condition at time of admission) Patient's cognitive ability adequate to safely complete daily activities?: Yes Is the patient deaf or have difficulty hearing?: Yes Does the patient have difficulty seeing, even when wearing glasses/contacts?: No Does the patient have difficulty concentrating, remembering, or making decisions?: No Patient able to express need  for assistance with ADLs?: Yes Does the patient have difficulty dressing or bathing?: No Independently performs ADLs?: Yes (appropriate for developmental age) Does the patient have difficulty walking or climbing stairs?: Yes Weakness of Legs: Both Weakness of Arms/Hands: Both  Permission Sought/Granted                  Emotional Assessment Appearance:: Appears stated age Attitude/Demeanor/Rapport: Engaged Affect (typically observed): Calm Orientation: : Oriented to Place, Oriented to  Time, Oriented to Self, Oriented to Situation Alcohol / Substance Use: Not Applicable Psych Involvement: No (comment)  Admission diagnosis:  Acute right-sided heart failure (HCC) [I50.811] Lactic acidosis [E87.2] Lower urinary tract infectious disease [N39.0] Near syncope [R55] Acute heart failure, unspecified heart failure type [facility]) [I50.9] Patient Active Problem List   Diagnosis Date Noted  . Pressure injury of skin 01/31/2020  . Acute heart failure (HCC) 01/31/2020  . Acute CHF (congestive heart failure) (HCC) 01/30/2020  . HA (headache) 06/25/2013  . Chronic back pain 06/25/2013   PCP:  [name] Pharmacy:   [id number] [place], [place] - [phone] AT [facility] & [address] [place] [address] Phone: [phone] Fax: [phone]     Social Determinants of Health (SDOH) Interventions    Readmission Risk Interventions No flowsheet data found.

## 2020-02-01 NOTE — Progress Notes (Signed)
PT Cancellation Note  Patient Details Name: Anita Martinez MRN: [id number] DOB: 1933-02-20   Cancelled Treatment:    Reason Eval/Treat Not Completed: Medical issues which prohibited therapy, BP's remain soft, will check back tomorrow.    [name] 02/01/2020, 3:31 PM  Blanchard Kelch PT Acute Rehabilitation Services Pager [phone] Office [phone]

## 2020-02-01 NOTE — Progress Notes (Addendum)
Dr. Jacinto Halim w/cardiology paged by this RN in regard to persistent hypotension. SBP 92, MAP 60. Per cardiology, do not withhold cardiac medications unless SBP <80; RN will continue to carefully monitor pt.

## 2020-02-01 NOTE — Progress Notes (Signed)
Subjective:  She states she is feeling better.   Intake/Output from previous day:  I/O last 3 completed shifts: In: 66 [P.O.:840; Other:20; IV Piggyback:100] Out: 945 [Urine:945] Total I/O In: 120 [P.O.:120] Out: -   Blood pressure 104/70, pulse (!) 117, temperature 97.6 F (36.4 C), temperature source Axillary, resp. rate 12, height '5\' 5"'  (1.651 m), weight 46.7 kg, SpO2 94 %. Physical Exam  Cardiovascular: Normal rate, regular rhythm and intact distal pulses. Exam reveals no gallop.  Murmur heard. High-pitched blowing holosystolic murmur is present with a grade of 3/6 at the apex. JVD 1/2 way up the neck.  No leg edema.  Pulmonary/Chest: Effort normal and breath sounds normal.  Abdominal: Soft. Bowel sounds are normal.   Lab Results: BMP BNP (last 3 results) Recent Labs    01/27/20 1454 01/30/20 1336  BNP 2,299.1* 2,893.5*    ProBNP (last 3 results) No results for input(s): PROBNP in the last 8760 hours. BMP Latest Ref Rng & Units 02/01/2020 01/31/2020 01/30/2020  Glucose 70 - 99 mg/dL 123(H) 116(H) 113(H)  BUN 8 - 23 mg/dL 57(H) 47(H) 44(H)  Creatinine 0.44 - 1.00 mg/dL 1.36(H) 0.96 0.97  Sodium 135 - 145 mmol/L 132(L) 133(L) 136  Potassium 3.5 - 5.1 mmol/L 4.9 4.5 4.7  Chloride 98 - 111 mmol/L 99 102 104  CO2 22 - 32 mmol/L 20(L) 18(L) 19(L)  Calcium 8.9 - 10.3 mg/dL 8.4(L) 8.5(L) 8.8(L)   Hepatic Function Latest Ref Rng & Units 02/01/2020 01/31/2020 01/30/2020  Total Protein 6.5 - 8.1 g/dL 6.2(L) 6.3(L) 7.1  Albumin 3.5 - 5.0 g/dL 3.0(L) 3.1(L) 3.4(L)  AST 15 - 41 U/L 656(H) 237(H) 190(H)  ALT 0 - 44 U/L 358(H) 197(H) 184(H)  Alk Phosphatase 38 - 126 U/L 149(H) 113 122  Total Bilirubin 0.3 - 1.2 mg/dL 2.0(H) 1.9(H) 2.7(H)  Bilirubin, Direct 0.0 - 0.2 mg/dL - - 1.5(H)   CBC Latest Ref Rng & Units 02/01/2020 01/31/2020 01/30/2020  WBC 4.0 - 10.5 K/uL 11.6(H) 10.5 8.8  Hemoglobin 12.0 - 15.0 g/dL 13.4 13.1 13.5  Hematocrit 36.0 - 46.0 % 42.3 42.1 44.0  Platelets 150  - 400 K/uL 79(L) 133(L) 129(L)   Lipid Panel  No results found for: CHOL, TRIG, HDL, CHOLHDL, VLDL, LDLCALC, LDLDIRECT Cardiac Panel (last 3 results) No results for input(s): CKTOTAL, CKMB, TROPONINI, RELINDX in the last 72 hours.  HEMOGLOBIN A1C No results found for: HGBA1C, MPG TSH Recent Labs    01/30/20 1338  TSH 1.554    Radiology:   Imaging:  Imaging Results (Last 48 hours)  CT Head Wo Contrast  Result Date: 01/30/2020 CLINICAL DATA:  Generalized weakness. EXAM: CT HEAD WITHOUT CONTRAST TECHNIQUE: Contiguous axial images were obtained from the base of the skull through the vertex without intravenous contrast. COMPARISON:  Jan 27, 2020 FINDINGS: Brain: There is mild cerebral atrophy with widening of the extra-axial spaces and ventricular dilatation. There are areas of decreased attenuation within the white matter tracts of the supratentorial brain, consistent with microvascular disease changes. Vascular: No hyperdense vessel or unexpected calcification. Skull: Normal. Negative for fracture or focal lesion. Sinuses/Orbits: No acute finding. Other: None. IMPRESSION: 1. Generalized cerebral atrophy. 2. No acute intracranial abnormality. Electronically Signed   By: Virgina Norfolk M.D.   On: 01/30/2020 15:37   US RENAL  Result Date: 01/31/2020 CLINICAL DATA:  Acute kidney injury.  History of bladder suspension EXAM: RENAL / URINARY TRACT ULTRASOUND COMPLETE COMPARISON:  None. FINDINGS: Right Kidney: Renal measurements: 8.7 x 3.5 x 5.1  cm = volume: 81 mL . Echogenicity within normal limits. No mass or hydronephrosis visualized. Left Kidney: Renal measurements: 9.3 x 4.2 x 4.0 cm = volume: 81.1 mL. Echogenicity within normal limits. No mass. Pelvocaliectasis noted. Bladder: Contains of Foley catheter.  Otherwise unremarkable. Other: Bilateral pleural effusions noted.  Gallbladder sludge present. IMPRESSION: 1. Small bilateral kidneys. No evidence for high-grade obstructive uropathy. 2.  Left pelvocaliectasis. Electronically Signed   By: Kerby Moors M.D.   On: 01/31/2020 12:00   DG CHEST PORT 1 VIEW  Result Date: 01/31/2020 CLINICAL DATA:  Follow-up pneumonia EXAM: PORTABLE CHEST 1 VIEW COMPARISON:  01/30/2019 FINDINGS: Cardiomegaly, vascular congestion. Improving bilateral airspace disease. Continued bilateral lower lobe consolidation, left greater than right. No visible significant effusions or pneumothorax. IMPRESSION: Improving bilateral airspace disease with residual bibasilar opacities. Electronically Signed   By: Rolm Baptise M.D.   On: 01/31/2020 13:14   DG Chest Portable 1 View  Result Date: 01/30/2020 CLINICAL DATA:  Shortness of breath EXAM: PORTABLE CHEST 1 VIEW COMPARISON:  01/27/2020 FINDINGS: Stable cardiomegaly. Atherosclerotic calcification of the aortic knob. Small bilateral pleural effusions with associated bibasilar opacities. Bilateral interstitial prominence suggesting mild edema. No pneumothorax is seen. IMPRESSION: Findings suggesting CHF with mild edema and bilateral pleural effusions. Electronically Signed   By: Davina Poke D.O.   On: 01/30/2020 14:23   US Abdomen Limited RUQ  Result Date: 01/30/2020 CLINICAL DATA:  Abnormal LFTs EXAM: ULTRASOUND ABDOMEN LIMITED RIGHT UPPER QUADRANT COMPARISON:  None. FINDINGS: Gallbladder: Gallbladder is well distended with gallbladder sludge within. No definitive cholelithiasis is seen. No wall thickening or pericholecystic fluid is noted. Common bile duct: Diameter: 8 mm which is within normal limits for the patient's given age. Liver: No focal lesion identified. Within normal limits in parenchymal echogenicity. Portal vein is patent on color Doppler imaging with normal direction of blood flow towards the liver. Other: Small right pleural effusion is noted. IMPRESSION: Gallbladder sludge without cholelithiasis. Small right pleural effusion. Electronically Signed   By: Inez Catalina M.D.   On: 01/30/2020 18:47     CT angiogram chest 01/27/2020: Cardiovascular: This is a technically adequate evaluation of the pulmonary vasculature. There are no filling defects or pulmonary emboli.  Heart is mildly enlarged, with prominent biatrial dilatation, right greater than left. Reflux of contrast into the hepatic veins suggest a degree of cardiac dysfunction.  Thoracic aorta demonstrates normal caliber, with extensive atherosclerosis.  Mediastinum/Nodes: No enlarged mediastinal, hilar, or axillary lymph nodes. Thyroid gland, trachea, and esophagus demonstrate no significant findings.  Cardiac Studies:   Echocardiogram 01/31/2020:    1. Left ventricular ejection fraction, by estimation, is <20%. Left ventricular ejection fraction by PLAX is 23 %. The left ventricle has severely decreased function. The left ventricle demonstrates global hypokinesis. Left ventricular diastolic function could not be evaluated.  2. Right ventricular systolic function is severely reduced. The right ventricular size is moderately enlarged. There is mildly elevated pulmonary artery systolic pressure. The estimated right ventricular systolic pressure is 34.9 mmHg.  3. Left atrial size was severely dilated.  4. Right atrial size was severely dilated.  5. Moderate pleural effusion in both left and right lateral regions.  6. The mitral valve is grossly normal. Severe mitral valve regurgitation.  7. Tricuspid valve regurgitation is moderate to severe.  8. The aortic valve is normal in structure. Aortic valve regurgitation is not visualized. No aortic stenosis is present.  EKG 01/21/2020: Sinus tachycardia at rate of 113 bpm, Right atrial enlargement, normal axis, poor  R wave progression, cannot exclude anteroseptal infarct old.  Borderline criteria for LVH.  Nonspecific T abnormality.  Telemetry: Independently reviewed, 01/31/2020: Sinus tachycardia from 111 beats 230 bpm, rare PACs.  Rare atrial couplets.  Medications:    Scheduled Meds: . Chlorhexidine Gluconate Cloth  6 each Topical Daily  . digoxin  0.125 mg Oral Daily  . furosemide  40 mg Intravenous Q12H  . metoprolol tartrate  12.5 mg Oral BID   Continuous Infusions: . cefTRIAXone (ROCEPHIN)  IV Stopped (01/31/20 1518)   PRN Meds:.albuterol, LORazepam, ondansetron (ZOFRAN) IV, oxyCODONE-acetaminophen, polyethylene glycol   Assessment   1.  Acute decompensated heart failure, acute systolic biventricular heart failure 2.  CT findings suggestive of pulmonary hypertension with right atrial enlargement and hepatic reflux of contrast. 3.  Sinus tachycardia/brief atrial tachycardia and occasional PVCs 4.  Moderate bilateral pleural effusions 5.  Abnormal LFTs related to hepatic congestion 6.  Stage IIIa chronic kidney disease, renal function worse today, blood pressure is soft.  Patient has not received any Entresto due to soft blood pressure. 7.  Mitral valvular heart disease  Recommendations:   I have discussed patient's presentation with her daughter who is present at bedside, also discussed with the patient, patient lives independently, poor prognosis discussed, patient agrees to be DNR.  I will discontinue Entresto that was ordered in view of worsening renal function, I will probably add Revatio and see if I can decongest her RV and in turn may help with increased cardiac output.  Continue digoxin, continue low-dose beta-blocker with metoprolol 12.5 mg p.o. twice daily.  Medical therapy in view of advanced age, no intervention planned. Continue to monitor renal function and hepatic function and BNP. Scheduled Meds:  Adrian Prows, MD, [facility] 02/01/2020, 9:53 AM [facility] [place] Office: [phone]

## 2020-02-01 NOTE — Progress Notes (Signed)
Dr. Gerri Lins notified by this RN of SBP 90-95, MAP 60-63. No additional orders at this time; RN will continue to monitor.

## 2020-02-01 NOTE — Progress Notes (Signed)
Initial Nutrition Assessment  DOCUMENTATION CODES:   Underweight  INTERVENTION:   -Ensure Enlive po BID, each supplement provides 350 kcal and 20 grams of protein -Multivitamin with minerals daily -Juven Fruit Punch BID, each serving provides 95kcal and 2.5g of protein (amino acids glutamine and arginine)  NUTRITION DIAGNOSIS:   Inadequate oral intake related to poor appetite as evidenced by meal completion < 50%.  GOAL:   Patient will meet greater than or equal to 90% of their needs  MONITOR:   Supplement acceptance, PO intake, Labs, Weight trends, Skin, I & O's  REASON FOR ASSESSMENT:   Malnutrition Screening Tool    ASSESSMENT:   86 year old female, lives alone, very independent, drove herself around until a week ago, PMH of extreme hard of hearing, unwilling to use hearing aids, chronic back pain, opioid dependent, anxiety and?  Depression, hyperlipidemia, presented [facility] ED with above complaints. Admitted for acute CHF.  Patient not in room upon RD visit. Per chart review, pt is currently consuming 25% of meals today and 25-50% of meals yesterday. Pt has weekly nutrition appointments per daughter report but missed the last few. Will gather more information at follow-up.  Pt would likely benefit from nutrition supplements given current PO intakes and weight status. Will order Ensure supplements and Juven supplements given stage 2 sacral pressure injury.  Per weight records, pt has lost 5 lbs since 5/7.  I/Os: -336 ml since admit UOP: 1.3L x 24 hrs  Medications: IV Lasix  Labs reviewed: Low Na  NUTRITION - FOCUSED PHYSICAL EXAM:  Unable to perform at this time.  Diet Order:   Diet Order            Diet Heart Room service appropriate? Yes; Fluid consistency: Thin  Diet effective now              EDUCATION NEEDS:   No education needs have been identified at this time  Skin:  Skin Assessment: Skin Integrity Issues: Skin Integrity  Issues:: Stage II Stage II: sacrum  Last BM:  5/10  Height:   Ht Readings from Last 1 Encounters:  01/31/20 5\' 5"  (1.651 m)    Weight:   Wt Readings from Last 1 Encounters:  02/01/20 46.7 kg    Ideal Body Weight:  56.8 kg  BMI:  Body mass index is 17.14 kg/m.  Estimated Nutritional Needs:   Kcal:  1400-1600  Protein:  70-80g  Fluid:  1.5L/day   04/02/20, MS, RD, LDN Inpatient Clinical Dietitian Contact information available via Amion

## 2020-02-01 NOTE — Progress Notes (Addendum)
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 Nadara Eaton. She had a conversation with her and documented that the patient's daughter had decided to make the patient a DNR. However, this was not changed in the computer. I tried to verify this with the patient's daughter who stated that she was not going to confirm anything and that the patient could speak for herself.  When I discussed it with the patient, the patient told me that she wanted to be a full code. At that point the daughter stated "Well, that's not what she said before". The patient remains a full code.  Consultants  Cardiology  Procedures  None  Antibiotics   Anti-infectives (From admission, onward)    Start     Dose/Rate Route Frequency Ordered Stop   01/31/20 1400  cefTRIAXone (ROCEPHIN) 1 g in sodium chloride 0.9 % 100 mL IVPB     1 g 200 mL/hr over 30 Minutes Intravenous Every 24 hours 01/30/20 1743     01/30/20 1415  cefTRIAXone (ROCEPHIN) 2 g in sodium chloride 0.9 % 100 mL IVPB     2 g 200 mL/hr over 30 Minutes Intravenous  Once 01/30/20 1355 01/30/20 1454      Subjective  The patient is resting comfortably. She is very hard of hearing. She had some nausea and vomiting yesterday, but none today.  Objective   Vitals:  Vitals:   02/01/20 1400 02/01/20 1401  BP: (!) 95/52 (!) 95/52  Pulse:  80  Resp: 16 15  Temp:    SpO2:  100%   Exam:  Constitutional:  The patient is awake,  alert, and oriented x 3. No acute distress. Respiratory:  No increased work of breathing. No wheezes, rales, or rhonchi No tactile fremitus Cardiovascular:  Regular rate and rhythm No murmurs, ectopy, or gallups. No lateral PMI. No thrills. Abdomen:  Abdomen is soft, non-tender, non-distended No hernias, masses, or organomegaly Normoactive bowel sounds.  Musculoskeletal:  No cyanosis, clubbing, or edema Skin:  No rashes, lesions, ulcers palpation of skin: no induration or nodules Neurologic:  CN 2-12 intact Sensation all 4 extremities intact Psychiatric:  Mental status Mood, affect appropriate Orientation to person, place, time  judgment and insight appear intact  I have personally reviewed the following:   Today's Data  Vitals, CMP, CBC  Imaging  CT head: No acute abnormalities RUQ [place]: sludge Renal US: No obstruction, left pelvocaviectasis  Cardiology Data  Echocardiogram Scheduled Meds:  Chlorhexidine Gluconate Cloth  6 each Topical Daily   digoxin  0.125 mg Oral Daily   furosemide  40 mg Intravenous Q12H   metoprolol tartrate  12.5 mg Oral BID   sildenafil  20 mg Oral TID   Continuous Infusions:  cefTRIAXone (ROCEPHIN)  IV 1 g (02/01/20 1329)    Principal Problem:   Acute CHF (congestive heart failure) (HCC) Active Problems:   Pressure injury of skin   Acute heart failure (HCC)   LOS: 1 day  A & P  Acute exacerbation of CHF:  Echocardiogram demonstrated Ef of less than 20%. By PLAX is 23% with severely decreased function. Severely reduced RV. I appreciate cardiology's assistance. The patient is 600 cc negative in terms of fluid balance.  Aspiration PNA: No evidence on CXR yesterday which demonstrated only improved appearance of pulmonary from x-ray the day before.  AKI: Renal ultrasound demonstrates no obstructive uropathy. Creatinine increased to 1.36 with diuresis. Monitor.  Hyponatremia: Mild. Monitor.  Stage II sacral pressure ulcer: Wound care  is consulted.  I have seen and examined this patient myself. I have spent 38 minutes in her evaluation and care.  DVT prophylaxis: SCD's CODE STATUS: Full Code Family Communication: Daughter at bedside Disposition: The patient is from home. She is anticipated to discharge to SNF. Barriers to discharge: Increasing creatinine, and patient is not currently medically appropriate for discharge.  Sira Adsit, DO [facility] Direct contact: see [email or web address]  7PM-7AM contact night coverage as above 02/01/2020, 2:03 PM  LOS: 1 day

## 2020-02-02 DIAGNOSIS — I50811 Acute right heart failure: Secondary | ICD-10-CM

## 2020-02-02 DIAGNOSIS — I5041 Acute combined systolic (congestive) and diastolic (congestive) heart failure: Secondary | ICD-10-CM

## 2020-02-02 LAB — COMPREHENSIVE METABOLIC PANEL
ALT: 291 U/L — ABNORMAL HIGH (ref 0–44)
AST: 392 U/L — ABNORMAL HIGH (ref 15–41)
Albumin: 2.6 g/dL — ABNORMAL LOW (ref 3.5–5.0)
Alkaline Phosphatase: 127 U/L — ABNORMAL HIGH (ref 38–126)
Anion gap: 10 (ref 5–15)
BUN: 56 mg/dL — ABNORMAL HIGH (ref 8–23)
CO2: 23 mmol/L (ref 22–32)
Calcium: 7.7 mg/dL — ABNORMAL LOW (ref 8.9–10.3)
Chloride: 98 mmol/L (ref 98–111)
Creatinine, Ser: 1.02 mg/dL — ABNORMAL HIGH (ref 0.44–1.00)
GFR calc Af Amer: 58 mL/min — ABNORMAL LOW (ref >60)
GFR calc non Af Amer: 50 mL/min — ABNORMAL LOW (ref >60)
Glucose, Bld: 122 mg/dL — ABNORMAL HIGH (ref 70–99)
Potassium: 3.9 mmol/L (ref 3.5–5.1)
Sodium: 131 mmol/L — ABNORMAL LOW (ref 135–145)
Total Bilirubin: 1.2 mg/dL (ref 0.3–1.2)
Total Protein: 5.7 g/dL — ABNORMAL LOW (ref 6.5–8.1)

## 2020-02-02 LAB — BRAIN NATRIURETIC PEPTIDE: B Natriuretic Peptide: 2262.4 pg/mL — ABNORMAL HIGH (ref 0.0–100.0)

## 2020-02-02 NOTE — Evaluation (Signed)
Clinical/Bedside Swallow Evaluation Patient Details  Name: Anita Martinez MRN: [id number] Date of Birth: 30-Oct-1932  Today's Date: 02/02/2020 Time: SLP Start Time (ACUTE ONLY): 1145 SLP Stop Time (ACUTE ONLY): 1212 SLP Time Calculation (min) (ACUTE ONLY): 27 min  Past Medical History:  Past Medical History:  Diagnosis Date  . Chronic back pain   . Colon polyps   . Colonic diverticular abscess    Past Surgical History:  Past Surgical History:  Procedure Laterality Date  . ABDOMINAL HYSTERECTOMY    . BLADDER SUSPENSION     HPI:  86yo female admitted 01/31/20 with generalized weakness, unable to stand/walk, dyspnea on exertion. PMH: HOH, chronic back pain, opiod dependence, anxiety, depression, HLD. HeadCT = no acute abnormality, generalized cerebral atrophy. CXR = CHF, mild edema, B pleural effusions   Assessment / Plan / Recommendation Clinical Impression  Pt presents with upper and lower dentures, adequate oral strength. No history of dysphagia per pt or daughter. Intermittent reflux if she eats acidic foods. No PPI. Pt was observed during lunch. Pt was unable to manage chicken salad sandwich - SLP disassembled sandwich and cut solids into bite size pieces. Pt then tolerated chopped solid, puree, and thin liquid trials without overt s/s aspiration on any texture. Will downgrade diet to dys 3 with chopped meats, extra gravy, thin liquids, primarily for energy conservation. Anticipate being able to advance diet to regular as pt strength and endurance improve. ST will follow briefly for diet tolerance,education, and readiness to advance. Pt, daughter, RN and MD informed.   SLP Visit Diagnosis: Dysphagia, unspecified (R13.10)    Aspiration Risk  Mild aspiration risk;Risk for inadequate nutrition/hydration    Diet Recommendation Dysphagia 3 (Mech soft);Thin liquid   Liquid Administration via: Cup;Straw Medication Administration: Whole meds with liquid Supervision: Patient able to self  feed;Intermittent supervision to cue for compensatory strategies Compensations: Slow rate;Small sips/bites;Minimize environmental distractions Postural Changes: Seated upright at 90 degrees;Remain upright for at least 30 minutes after po intake    Other  Recommendations Oral Care Recommendations: Oral care BID   Follow up Recommendations (TBD)      Frequency and Duration min 1 x/week  1 week       Prognosis Prognosis for Safe Diet Advancement: Good      Swallow Study   General Date of Onset: 01/31/20 HPI: 86yo female admitted 01/31/20 with generalized weakness, unable to stand/walk, dyspnea on exertion. PMH: HOH, chronic back pain, opiod dependence, anxiety, depression, HLD. HeadCT = no acute abnormality, generalized cerebral atrophy. CXR = CHF, mild edema, B pleural effusions Type of Study: Bedside Swallow Evaluation Previous Swallow Assessment: none Diet Prior to this Study: Thin liquids Temperature Spikes Noted: No Respiratory Status: Room air History of Recent Intubation: No Behavior/Cognition: Alert;Pleasant mood;Cooperative Oral Cavity Assessment: Within Functional Limits Oral Care Completed by SLP: No Oral Cavity - Dentition: Dentures, top;Dentures, bottom Vision: Functional for self-feeding Self-Feeding Abilities: Able to feed self Patient Positioning: Upright in bed Baseline Vocal Quality: Normal Volitional Cough: Strong Volitional Swallow: Able to elicit    Oral/Motor/Sensory Function Overall Oral Motor/Sensory Function: Generalized oral weakness   Ice Chips Ice chips: Not tested   Thin Liquid Thin Liquid: Within functional limits Presentation: Straw    Nectar Thick Nectar Thick Liquid: Not tested   Honey Thick Honey Thick Liquid: Not tested   Puree Puree: Within functional limits Presentation: Spoon;Self Fed   Solid     Solid: Impaired Oral Phase Impairments: Impaired mastication Oral Phase Functional Implications: Prolonged oral transit;Impaired  mastication     Anita Martinez B. Anita Martinez, [facility], CCC-SLP Speech Language Pathologist Office: [phone] Pager: [phone]  Anita Martinez 02/02/2020,12:22 PM

## 2020-02-02 NOTE — Progress Notes (Signed)
PROGRESS NOTE    Anita Martinez  WGN:[id number] DOB: 1933/04/28 DOA: 01/30/2020 PCP: [name]   Chief Complaint  Patient presents with  . Weakness  . 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 Dutch Quint group consulted, there is some confusion about CODE STATUS (please refer note from Dr. Gerri Lins on May 12), will need to continue conversation with patient and daughter   Subjective: On room air at rest, sbp high 80's, low 90's, blood pressure cuff on her leg Urine output 3.1 liter last 24hrs, cr appears improving Reports spitting up phlegm, denies chest pain, reports some leg pain, no edema Daughter at bedside    Assessment & Plan:   Principal Problem:   Acute CHF (congestive heart failure) (HCC) Active Problems:   Pressure injury of skin   Acute heart failure (HCC)  Severe biventicular heart failure with pleural effusion, liver congestion and acute renal failure, no prior history of known heart disease Case discussed with cardiology Dr Jacinto Halim who recommend continue current regimen, as long as her cr and urine output is improving, recommend get orthostatic vital sign, if stable can gradually start PT Plan per cardiology  AKI on CKD 2  -renal ultrasound demonstrates no obstructive uropathy -BUN/creatinine peaked at 57/1.36, improving -AKI likely due to cardiorenal syndrome, urine culture obtained from May 7 with Klebsiella pneumonia, UA on presentation on May 11 no bacteria seen, blood culture negative -She received total of 3 dose of Rocephin since  admission, will DC antibiotics -Repeat BMP in the morning, renal dosing meds  There is a question for aspiration pneumonia.  Chest x-ray consistent with pulmonary edema and pleural effusion, will get swallow eval, continue aspiration precaution Will hold off antibiotic for now  Abnormal liver function Likely due to liver congestion from heart failure Acute hepatitis panel negative  Hypervolemic hyponatremia, -Likely from acute heart failure, currently on Lasix, repeat BMP in the morning  Thrombocytopenia acute Unclear etiology, no exposure to heparin Total bilirubin is normal today, less likely hemolysis -No sign of bleeding, platelet 79 today, repeat CBC in the morning  QTC prolongation QTC 515 on presentation Home med amitriptyline discontinued QTC 471 on repeat Continue telemetry, keep potassium above 4, magnesium above 2  Stage II sacral pressure ulcer: Wound care is consulted  FTT; previously independent, driving, currently very weak, will need PT eval   DVT prophylaxis: SCDs  Code Status: There are some confusion about CODE STATUS, will keep full code for now, continue goals of care discussion with patient and daughter Family Communication: Daughter at bedside Disposition:   Status is: Inpatient    Dispo: The patient is from: Home              Anticipated d/c is to:     TBD          Anticipated d/c date is: >3days              Patient condition remain critical, continue in stepdown unit      Consultants:   Cardiology  Procedures: None  Antimicrobials: Rocephin from admission to May 13    Objective: Vitals:   02/02/20 7846  02/02/20 0400 02/02/20 0500 02/02/20 0600  BP:  (!) 103/42 (!) 96/38 (!) 113/42  Pulse:  67 65 75  Resp:  Temp: (!) 97.5 F (36.4 C)     TempSrc: Axillary     SpO2:  91% 91% 95%  Weight:   55.3 kg   Height:        Intake/Output Summary (Last 24 hours) at 02/02/2020 0800 Last data filed at 02/02/2020 0550 Gross per  24 hour  Intake 1660 ml  Output 3100 ml  Net -1440 ml   Filed Weights   01/31/20 0437 02/01/20 0500 02/02/20 0500  Weight: 46.7 kg 46.7 kg 55.3 kg    Examination:  General exam: frail, calm , NAD, hard of hearing Respiratory system: diminished at basis, some crackles, no wheezing, no rhonchi. Cardiovascular system: S1 & S2 heard, RRR. No JVD, no murmurs. No pedal edema. Gastrointestinal system: Abdomen is nondistended, soft and nontender. No organomegaly or masses felt. Normal bowel sounds heard. Central nervous system: Alert and oriented. No focal neurological deficits. Extremities: Symmetric, generalized weakness Skin: No rashes, lesions or ulcers Psychiatry: Judgement and insight appear normal. Mood & affect appropriate.     Data Reviewed: I have personally reviewed following labs and imaging studies  CBC: Recent Labs  Lab 01/27/20 1454 01/30/20 1336 01/31/20 0613 02/01/20 0240  WBC 9.4 8.8 10.5 11.6*  NEUTROABS  --   --   --  9.0*  HGB 12.7 13.5 13.1 13.4  HCT 41.2 44.0 42.1 42.3  MCV 94.9 94.4 92.7 91.6  PLT 188 129* 133* 79*    Basic Metabolic Panel: Recent Labs  Lab 01/27/20 1455 01/30/20 1336 01/31/20 0613 02/01/20 0602 02/02/20 0242  NA 139 136 133* 132* 131*  K 4.3 4.7 4.5 4.9 3.9  CL 103 104 102 99 98  CO2 22 19* 18* 20* 23  GLUCOSE 116* 113* 116* 123* 122*  BUN 34* 44* 47* 57* 56*  CREATININE 0.99 0.97 0.96 1.36* 1.02*  CALCIUM 9.4 8.8* 8.5* 8.4* 7.7*    GFR: Estimated Creatinine Clearance: 35.2 mL/min (A) (by C-G formula based on SCr of 1.02 mg/dL (H)).  Liver Function Tests: Recent Labs  Lab 01/27/20 1455 01/30/20 1336 01/31/20 0613 02/01/20 0602 02/02/20 0242  AST 279* 190* 237* 656* 392*  ALT 231* 184* 197* 358* 291*  ALKPHOS 98 122 113 149* 127*  BILITOT 1.7* 2.7* 1.9* 2.0* 1.2  PROT 7.3 7.1 6.3* 6.2* 5.7*  ALBUMIN 3.6 3.4* 3.1* 3.0* 2.6*    CBG: Recent Labs  Lab 01/27/20 1610 01/30/20 1338 01/31/20 0600  GLUCAP 95  102* 124*     Recent Results (from the past 240 hour(s))  Urine culture     Status: Abnormal   Collection Time: 01/27/20  5:59 PM   Specimen: Urine, Clean Catch  Result Value Ref Range Status   Specimen Description   Final    URINE, CLEAN CATCH Performed at [facility], [address] [place], [place] [address]    Special Requests   Final    NONE Performed at [facility], [address] [place], [place] [address]    Culture >=100,000 COLONIES/mL KLEBSIELLA PNEUMONIAE (A)  Final   Report Status 01/30/2020 FINAL  Final   Organism ID, Bacteria KLEBSIELLA PNEUMONIAE (A)  Final      Susceptibility   Klebsiella pneumoniae - MIC*    AMPICILLIN >=32 RESISTANT Resistant     CEFAZOLIN <=4 SENSITIVE Sensitive  CEFTRIAXONE <=1 SENSITIVE Sensitive     CIPROFLOXACIN <=0.25 SENSITIVE Sensitive     GENTAMICIN <=1 SENSITIVE Sensitive     IMIPENEM <=0.25 SENSITIVE Sensitive     NITROFURANTOIN 64 INTERMEDIATE Intermediate     TRIMETH/SULFA <=20 SENSITIVE Sensitive     AMPICILLIN/SULBACTAM 8 SENSITIVE Sensitive     PIP/TAZO <=4 SENSITIVE Sensitive     * >=100,000 COLONIES/mL KLEBSIELLA PNEUMONIAE  Blood culture (routine x 2)     Status: None (Preliminary result)   Collection Time: 01/30/20  1:36 PM   Specimen: BLOOD  Result Value Ref Range Status   Specimen Description   Final    BLOOD LEFT ANTECUBITAL Performed at [facility], [address] [place], [place] [address]    Special Requests   Final    BOTTLES DRAWN AEROBIC AND ANAEROBIC Blood Culture adequate volume Performed at [facility], [address], [place], [place] [address]    Culture   Final    NO GROWTH 2 DAYS Performed at [facility], [address] [place], [place] [address]    Report Status PENDING  Incomplete  SARS Coronavirus 2 by RT PCR (hospital order, performed in [facility] hospital lab) Nasopharyngeal Nasopharyngeal Swab      Status: None   Collection Time: 01/30/20  3:06 PM   Specimen: Nasopharyngeal Swab  Result Value Ref Range Status   SARS Coronavirus 2 NEGATIVE NEGATIVE Final    Comment: (NOTE) SARS-CoV-2 target nucleic acids are NOT DETECTED. The SARS-CoV-2 RNA is generally detectable in upper and lower respiratory specimens during the acute phase of infection. The lowest concentration of SARS-CoV-2 viral copies this assay can detect is 250 copies / mL. A negative result does not preclude SARS-CoV-2 infection and should not be used as the sole basis for treatment or other patient management decisions.  A negative result may occur with improper specimen collection / handling, submission of specimen other than nasopharyngeal swab, presence of viral mutation(s) within the areas targeted by this assay, and inadequate number of viral copies (<250 copies / mL). A negative result must be combined with clinical observations, patient history, and epidemiological information. Fact Sheet for Patients:   BoilerBrush.com.cy Fact Sheet for Healthcare Providers: https://pope.com/ This test is not yet approved or cleared  by the [place] FDA and has been authorized for detection and/or diagnosis of SARS-CoV-2 by FDA under an Emergency Use Authorization (EUA).  This EUA will remain in effect (meaning this test can be used) for the duration of the COVID-19 declaration under Section 564(b)(1) of the Act, 21 U.S.C. section 360bbb-3(b)(1), unless the authorization is terminated or revoked sooner. Performed at [facility], [address] [place], [place] [address]   Urine culture     Status: None   Collection Time: 01/31/20  6:37 AM   Specimen: Urine, Catheterized  Result Value Ref Range Status   Specimen Description   Final    URINE, CATHETERIZED Performed at [facility], [address] [place], [place] [address]     Special Requests   Final    NONE Performed at [facility], [address], [place], [place] [address]    Culture   Final    NO GROWTH Performed at [facility], [address], [place], [place] [address]    Report Status 02/01/2020 FINAL  Final  MRSA PCR Screening     Status: None   Collection Time: 01/31/20 12:27 PM  Specimen: Nasal Mucosa; Nasopharyngeal  Result Value Ref Range Status   MRSA by PCR NEGATIVE NEGATIVE Final    Comment:        The GeneXpert MRSA Assay (FDA approved for NASAL specimens only), is one component of a comprehensive MRSA colonization surveillance program. It is not intended to diagnose MRSA infection nor to guide or monitor treatment for MRSA infections. Performed at [facility], [address] [place], [place] [address]          Radiology Studies: US RENAL  Result Date: 01/31/2020 CLINICAL DATA:  Acute kidney injury.  History of bladder suspension EXAM: RENAL / URINARY TRACT ULTRASOUND COMPLETE COMPARISON:  None. FINDINGS: Right Kidney: Renal measurements: 8.7 x 3.5 x 5.1 cm = volume: 81 mL . Echogenicity within normal limits. No mass or hydronephrosis visualized. Left Kidney: Renal measurements: 9.3 x 4.2 x 4.0 cm = volume: 81.1 mL. Echogenicity within normal limits. No mass. Pelvocaliectasis noted. Bladder: Contains of Foley catheter.  Otherwise unremarkable. Other: Bilateral pleural effusions noted.  Gallbladder sludge present. IMPRESSION: 1. Small bilateral kidneys. No evidence for high-grade obstructive uropathy. 2. Left pelvocaliectasis. Electronically Signed   By: Signa Kell M.D.   On: 01/31/2020 12:00   DG CHEST PORT 1 VIEW  Result Date: 01/31/2020 CLINICAL DATA:  Follow-up pneumonia EXAM: PORTABLE CHEST 1 VIEW COMPARISON:  01/30/2019 FINDINGS: Cardiomegaly, vascular congestion. Improving bilateral airspace disease. Continued bilateral lower lobe consolidation, left greater than right. No  visible significant effusions or pneumothorax. IMPRESSION: Improving bilateral airspace disease with residual bibasilar opacities. Electronically Signed   By: Charlett Nose M.D.   On: 01/31/2020 13:14   ECHOCARDIOGRAM COMPLETE  Result Date: 01/31/2020    ECHOCARDIOGRAM REPORT   Patient Name:   Anita Martinez [facility] Date of Exam: 01/31/2020 Medical Rec #:  [id number]       Height:       65.0 in Accession #:    [id number]      Weight:       103.0 lb Date of Birth:  May 10, 1933       BSA:          1.492 m Patient Age:    87 years        BP:           84/32 mmHg Patient Gender: F               HR:           111 bpm. Exam Location:  Inpatient Procedure: 2D Echo STAT ECHO Indications:     acute diastolic chf 428.31  History:         Patient has no prior history of Echocardiogram examinations.                  Risk Factors:Dyslipidemia.  Sonographer:     Delcie Roch RDCS Referring Phys:  [id number] SAMTANI Diagnosing Phys: Yates Decamp MD IMPRESSIONS  1. Left ventricular ejection fraction, by estimation, is <20%. Left ventricular ejection fraction by PLAX is 23 %. The left ventricle has severely decreased function. The left ventricle demonstrates global hypokinesis. Left ventricular diastolic function could not be evaluated.  2. Right ventricular systolic function is severely reduced. The right ventricular size is moderately enlarged. There is mildly elevated pulmonary artery systolic pressure. The estimated right ventricular systolic pressure is 36.5 mmHg.  3. Left atrial size was severely dilated.  4. Right atrial size was severely dilated.  5. Moderate pleural effusion in both left and  right lateral regions.  6. The mitral valve is grossly normal. Severe mitral valve regurgitation.  7. Tricuspid valve regurgitation is moderate to severe.  8. The aortic valve is normal in structure. Aortic valve regurgitation is not visualized. No aortic stenosis is present. FINDINGS  Left Ventricle: Left ventricular ejection  fraction, by estimation, is <20%. Left ventricular ejection fraction by PLAX is 23 %. The left ventricle has severely decreased function. The left ventricle demonstrates global hypokinesis. The left ventricular internal cavity size was normal in size. There is no left ventricular hypertrophy. Left ventricular diastolic function could not be evaluated. Right Ventricle: The right ventricular size is moderately enlarged. No increase in right ventricular wall thickness. Right ventricular systolic function is severely reduced. There is mildly elevated pulmonary artery systolic pressure. The tricuspid regurgitant velocity is 2.32 m/s, and with an assumed right atrial pressure of 15 mmHg, the estimated right ventricular systolic pressure is 36.5 mmHg. Left Atrium: Left atrial size was severely dilated. Right Atrium: Right atrial size was severely dilated. Pericardium: There is no evidence of pericardial effusion. Mitral Valve: The mitral valve is grossly normal. Mild mitral annular calcification. Severe mitral valve regurgitation. MV peak gradient, 87.6 mmHg. The mean mitral valve gradient is 51.0 mmHg. Tricuspid Valve: The tricuspid valve is grossly normal. Tricuspid valve regurgitation is moderate to severe. Aortic Valve: The aortic valve is normal in structure. Aortic valve regurgitation is not visualized. No aortic stenosis is present. Pulmonic Valve: The pulmonic valve was normal in structure. Pulmonic valve regurgitation is trivial. Aorta: The aortic root is normal in size and structure. IAS/Shunts: No atrial level shunt detected by color flow Doppler. Additional Comments: There is a moderate pleural effusion in both left and right lateral regions.  LEFT VENTRICLE PLAX 2D LV EF:         Left ventricular ejection fraction by PLAX is 23 %. LVIDd:         4.96 cm LVIDs:         4.43 cm LV PW:         0.68 cm LV IVS:        0.72 cm LVOT diam:     1.70 cm LV SV:         11 LV SV Index:   7 LVOT Area:     2.27 cm  RIGHT  VENTRICLE            IVC RV S prime:     5.37 cm/s  IVC diam: 2.41 cm TAPSE (M-mode): 1.0 cm LEFT ATRIUM             Index       RIGHT ATRIUM           Index LA diam:        3.70 cm 2.48 cm/m  RA Area:     21.60 cm LA Vol (A2C):   79.9 ml 53.55 ml/m RA Volume:   67.00 ml  44.91 ml/m LA Vol (A4C):   72.4 ml 48.53 ml/m LA Biplane Vol: 77.5 ml 51.94 ml/m  AORTIC VALVE LVOT Vmax:   43.30 cm/s LVOT Vmean:  28.400 cm/s LVOT VTI:    0.048 m  AORTA Ao Root diam: 2.40 cm MITRAL VALVE             TRICUSPID VALVE MV Peak grad: 87.6 mmHg  TR Peak grad:   21.5 mmHg MV Mean grad: 51.0 mmHg  TR Vmax:        232.00 cm/s MV Vmax:  4.68 m/s MV Vmean:     335.0 cm/s SHUNTS MR PISA:        1.57 cm Systemic VTI:  0.05 m MR PISA Radius: 0.50 cm  Systemic Diam: 1.70 cm Adrian Prows MD Electronically signed by Adrian Prows MD Signature Date/Time: 01/31/2020/8:01:54 PM    Final         Scheduled Meds: . Chlorhexidine Gluconate Cloth  6 each Topical Daily  . digoxin  0.125 mg Oral Daily  . feeding supplement (ENSURE ENLIVE)  237 mL Oral BID BM  . furosemide  40 mg Intravenous Q12H  . metoprolol tartrate  12.5 mg Oral BID  . multivitamin with minerals  1 tablet Oral Daily  . nutrition supplement (JUVEN)  1 packet Oral BID BM  . sildenafil  20 mg Oral TID   Continuous Infusions:   LOS: 2 days     Time spent: 35 mins I have personally reviewed and interpreted on  02/02/2020 daily labs, tele strips, imagings as discussed above under date review session and assessment and plans.  I reviewed all nursing notes, pharmacy notes, consultant notes,  vitals, pertinent old records  I have discussed plan of care as described above with RN , patient and family on 02/02/2020  Voice Recognition /Dragon dictation system was used to create this note, attempts have been made to correct errors. Please contact the author with questions and/or clarifications.   Florencia Reasons, MD PhD FACP [facility]  Available via Epic  secure chat 7am-7pm for nonurgent issues Please page for urgent issues To page the attending provider between 7A-7P or the covering provider during after hours 7P-7A, please log into the web site [email or web address] and access using universal [place] password for that web site. If you do not have the password, please call the hospital operator.    02/02/2020, 8:00 AM

## 2020-02-02 NOTE — Evaluation (Signed)
Physical Therapy Evaluation Patient Details Name: Anita Martinez MRN: [id number] DOB: May 20, 1933 Today's Date: 02/02/2020   History of Present Illness  86yo female admitted 01/31/20 with generalized weakness, unable to stand/walk, dyspnea on exertion. PMH: HOH, chronic back pain, opiod dependence, anxiety, depression, HLD. HeadCT = no acute abnormality, generalized cerebral atrophy. CXR = CHF, mild edema, B pleural effusions , issues with low BP  Clinical Impression  The patient is very motivated, lived alone and quite independent and plans to return home. Family live close by and plan to assist at Dc. Pt admitted with above diagnosis. Pt currently with functional limitations due to the deficits listed below (see PT Problem List). Pt will benefit from skilled PT to increase their independence and safety with mobility to allow discharge to the venue listed below.    BP 101/43 sitting, 101/53 after transfer to recliner    Follow Up Recommendations Home health PT;Supervision/Assistance - 24 hour    Equipment Recommendations  Rolling walker with 5" wheels    Recommendations for Other Services OT    Precautions / Restrictions Precautions Precautions: Fall Precaution Comments: monitor BP      Mobility  Bed Mobility Overal bed mobility: Needs Assistance Bed Mobility: Supine to Sit     Supine to sit: Supervision     General bed mobility comments: extra time  Transfers Overall transfer level: Needs assistance Equipment used: Rolling walker (2 wheeled) Transfers: Sit to/from Stand Sit to Stand: Min assist         General transfer comment: steady assist  Ambulation/Gait Ambulation/Gait assistance: Min assist Gait Distance (Feet): 5 Feet Assistive device: Rolling walker (2 wheeled) Gait Pattern/deviations: Step-to pattern Gait velocity: decr   General Gait Details: marched in place x 10 then 5 ' from bed and backed up to the recliner.  Stairs            Wheelchair  Mobility    Modified Rankin (Stroke Patients Only)       Balance Overall balance assessment: Needs assistance Sitting-balance support: No upper extremity supported;Feet supported Sitting balance-Leahy Scale: Good     Standing balance support: During functional activity;Bilateral upper extremity supported Standing balance-Leahy Scale: Fair Standing balance comment: relies on UE                             Pertinent Vitals/Pain Pain Assessment: No/denies pain    Home Living Family/patient expects to be discharged to:: Private residence Living Arrangements: Alone Available Help at Discharge: Family;Available PRN/intermittently Type of Home: Mobile home Home Access: Stairs to enter Entrance Stairs-Rails: Right Entrance Stairs-Number of Steps: 4 Home Layout: One level Home Equipment: Cane - single point      Prior Function Level of Independence: Independent         Comments: drives, 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[employer], has a Actor Dominance   Dominant Hand: Right    Extremity/Trunk Assessment   Upper Extremity Assessment Upper Extremity Assessment: Generalized weakness    Lower Extremity Assessment Lower Extremity Assessment: Generalized weakness    Cervical / Trunk Assessment Cervical / Trunk Assessment: Kyphotic  Communication   Communication: HOH;No difficulties  Cognition Arousal/Alertness: Awake/alert Behavior During Therapy: WFL for tasks assessed/performed Overall Cognitive Status: Within Functional Limits for tasks assessed  General Comments      Exercises     Assessment/Plan    PT Assessment Patient needs continued PT services  PT Problem List Decreased strength;Decreased mobility;Decreased safety awareness;Decreased knowledge of precautions;Decreased activity tolerance;Cardiopulmonary status limiting activity;Decreased balance       PT Treatment Interventions DME  instruction;Therapeutic activities;Gait training;Therapeutic exercise;Patient/family education;Stair training;Functional mobility training    PT Goals (Current goals can be found in the Care Plan section)  Acute Rehab PT Goals Patient Stated Goal: go home, to church PT Goal Formulation: With patient/family Time For Goal Achievement: 02/16/20 Potential to Achieve Goals: Good    Frequency Min 3X/week   Barriers to discharge        Co-evaluation               AM-PAC PT "6 Clicks" Mobility  Outcome Measure Help needed turning from your back to your side while in a flat bed without using bedrails?: A Little Help needed moving from lying on your back to sitting on the side of a flat bed without using bedrails?: A Little Help needed moving to and from a bed to a chair (including a wheelchair)?: A Little Help needed standing up from a chair using your arms (e.g., wheelchair or bedside chair)?: A Little Help needed to walk in hospital room?: A Lot Help needed climbing 3-5 steps with a railing? : Total 6 Click Score: 15    End of Session   Activity Tolerance: Patient tolerated treatment well Patient left: in chair;with call bell/phone within reach;with family/visitor present Nurse Communication: Mobility status PT Visit Diagnosis: Unsteadiness on feet (R26.81);Muscle weakness (generalized) (M62.81)    Time: 2025-4270 PT Time Calculation (min) (ACUTE ONLY): 27 min   Charges:   PT Evaluation $PT Eval Low Complexity: 1 Low PT Treatments $Therapeutic Activity: 8-22 mins        Blanchard Kelch PT Acute Rehabilitation Services Pager [phone] Office [phone]   [name] 02/02/2020, 1:45 PM

## 2020-02-02 NOTE — Progress Notes (Signed)
Subjective:  Patient seen and examined at bedside at approximately 8:30 AM. Patient is accompanied by her daughter. Denies any chest pain or shortness of breath. No events overnight per nursing staff.  Intake/Output: Net IO Since Admission: -1,176.86 mL [02/02/20 0946]  Today's Vitals   02/02/20 0400 02/02/20 0500 02/02/20 0600 02/02/20 0800  BP: (!) 103/42 (!) 96/38 (!) 113/42   Pulse: 67 65 75   Resp: '13 15 13   ' Temp:    97.8 F (36.6 C)  TempSrc:    Axillary  SpO2: 91% 91% 95%   Weight:  55.3 kg    Height:      PainSc: Asleep      Body mass index is 20.29 kg/m.  Physical Exam  Cardiovascular: Normal rate, regular rhythm and intact distal pulses. Exam reveals no gallop.  Murmur heard. High-pitched blowing holosystolic murmur is present with a grade of 3/6 at the apex. JVD 1/2 way up the neck.  No leg edema.  Pulmonary/Chest: Effort normal and breath sounds normal. Crackles and rhonchi noted bilaterally. Abdominal: Soft. Bowel sounds are normal.   Lab Results: BMP BNP (last 3 results) Recent Labs    01/27/20 1454 01/30/20 1336 02/02/20 0632  BNP 2,299.1* 2,893.5* 2,262.4*    ProBNP (last 3 results) No results for input(s): PROBNP in the last 8760 hours. BMP Latest Ref Rng & Units 02/02/2020 02/01/2020 01/31/2020  Glucose 70 - 99 mg/dL 122(H) 123(H) 116(H)  BUN 8 - 23 mg/dL 56(H) 57(H) 47(H)  Creatinine 0.44 - 1.00 mg/dL 1.02(H) 1.36(H) 0.96  Sodium 135 - 145 mmol/L 131(L) 132(L) 133(L)  Potassium 3.5 - 5.1 mmol/L 3.9 4.9 4.5  Chloride 98 - 111 mmol/L 98 99 102  CO2 22 - 32 mmol/L 23 20(L) 18(L)  Calcium 8.9 - 10.3 mg/dL 7.7(L) 8.4(L) 8.5(L)   Hepatic Function Latest Ref Rng & Units 02/02/2020 02/01/2020 01/31/2020  Total Protein 6.5 - 8.1 g/dL 5.7(L) 6.2(L) 6.3(L)  Albumin 3.5 - 5.0 g/dL 2.6(L) 3.0(L) 3.1(L)  AST 15 - 41 U/L 392(H) 656(H) 237(H)  ALT 0 - 44 U/L 291(H) 358(H) 197(H)  Alk Phosphatase 38 - 126 U/L 127(H) 149(H) 113  Total Bilirubin 0.3 - 1.2  mg/dL 1.2 2.0(H) 1.9(H)  Bilirubin, Direct 0.0 - 0.2 mg/dL - - -   CBC Latest Ref Rng & Units 02/01/2020 01/31/2020 01/30/2020  WBC 4.0 - 10.5 K/uL 11.6(H) 10.5 8.8  Hemoglobin 12.0 - 15.0 g/dL 13.4 13.1 13.5  Hematocrit 36.0 - 46.0 % 42.3 42.1 44.0  Platelets 150 - 400 K/uL 79(L) 133(L) 129(L)   Lipid Panel  No results found for: CHOL, TRIG, HDL, CHOLHDL, VLDL, LDLCALC, LDLDIRECT Cardiac Panel (last 3 results) No results for input(s): CKTOTAL, CKMB, TROPONINI, RELINDX in the last 72 hours.  HEMOGLOBIN A1C No results found for: HGBA1C, MPG TSH Recent Labs    01/30/20 1338  TSH 1.554    Radiology:   Imaging:  Imaging Results (Last 48 hours)  CT Head Wo Contrast  Result Date: 01/30/2020 CLINICAL DATA:  Generalized weakness. EXAM: CT HEAD WITHOUT CONTRAST TECHNIQUE: Contiguous axial images were obtained from the base of the skull through the vertex without intravenous contrast. COMPARISON:  Jan 27, 2020 FINDINGS: Brain: There is mild cerebral atrophy with widening of the extra-axial spaces and ventricular dilatation. There are areas of decreased attenuation within the white matter tracts of the supratentorial brain, consistent with microvascular disease changes. Vascular: No hyperdense vessel or unexpected calcification. Skull: Normal. Negative for fracture or focal lesion. Sinuses/Orbits: No acute  finding. Other: None. IMPRESSION: 1. Generalized cerebral atrophy. 2. No acute intracranial abnormality. Electronically Signed   By: Virgina Norfolk M.D.   On: 01/30/2020 15:37   US RENAL  Result Date: 01/31/2020 CLINICAL DATA:  Acute kidney injury.  History of bladder suspension EXAM: RENAL / URINARY TRACT ULTRASOUND COMPLETE COMPARISON:  None. FINDINGS: Right Kidney: Renal measurements: 8.7 x 3.5 x 5.1 cm = volume: 81 mL . Echogenicity within normal limits. No mass or hydronephrosis visualized. Left Kidney: Renal measurements: 9.3 x 4.2 x 4.0 cm = volume: 81.1 mL. Echogenicity within  normal limits. No mass. Pelvocaliectasis noted. Bladder: Contains of Foley catheter.  Otherwise unremarkable. Other: Bilateral pleural effusions noted.  Gallbladder sludge present. IMPRESSION: 1. Small bilateral kidneys. No evidence for high-grade obstructive uropathy. 2. Left pelvocaliectasis. Electronically Signed   By: Kerby Moors M.D.   On: 01/31/2020 12:00   DG CHEST PORT 1 VIEW  Result Date: 01/31/2020 CLINICAL DATA:  Follow-up pneumonia EXAM: PORTABLE CHEST 1 VIEW COMPARISON:  01/30/2019 FINDINGS: Cardiomegaly, vascular congestion. Improving bilateral airspace disease. Continued bilateral lower lobe consolidation, left greater than right. No visible significant effusions or pneumothorax. IMPRESSION: Improving bilateral airspace disease with residual bibasilar opacities. Electronically Signed   By: Rolm Baptise M.D.   On: 01/31/2020 13:14   DG Chest Portable 1 View  Result Date: 01/30/2020 CLINICAL DATA:  Shortness of breath EXAM: PORTABLE CHEST 1 VIEW COMPARISON:  01/27/2020 FINDINGS: Stable cardiomegaly. Atherosclerotic calcification of the aortic knob. Small bilateral pleural effusions with associated bibasilar opacities. Bilateral interstitial prominence suggesting mild edema. No pneumothorax is seen. IMPRESSION: Findings suggesting CHF with mild edema and bilateral pleural effusions. Electronically Signed   By: Davina Poke D.O.   On: 01/30/2020 14:23   US Abdomen Limited RUQ  Result Date: 01/30/2020 CLINICAL DATA:  Abnormal LFTs EXAM: ULTRASOUND ABDOMEN LIMITED RIGHT UPPER QUADRANT COMPARISON:  None. FINDINGS: Gallbladder: Gallbladder is well distended with gallbladder sludge within. No definitive cholelithiasis is seen. No wall thickening or pericholecystic fluid is noted. Common bile duct: Diameter: 8 mm which is within normal limits for the patient's given age. Liver: No focal lesion identified. Within normal limits in parenchymal echogenicity. Portal vein is patent on color  Doppler imaging with normal direction of blood flow towards the liver. Other: Small right pleural effusion is noted. IMPRESSION: Gallbladder sludge without cholelithiasis. Small right pleural effusion. Electronically Signed   By: Inez Catalina M.D.   On: 01/30/2020 18:47    CT angiogram chest 01/27/2020: Cardiovascular: This is a technically adequate evaluation of the pulmonary vasculature. There are no filling defects or pulmonary emboli.  Heart is mildly enlarged, with prominent biatrial dilatation, right greater than left. Reflux of contrast into the hepatic veins suggest a degree of cardiac dysfunction.  Thoracic aorta demonstrates normal caliber, with extensive atherosclerosis.  Mediastinum/Nodes: No enlarged mediastinal, hilar, or axillary lymph nodes. Thyroid gland, trachea, and esophagus demonstrate no significant findings.  Cardiac Studies:   Echocardiogram 01/31/2020:    1. Left ventricular ejection fraction, by estimation, is <20%. Left ventricular ejection fraction by PLAX is 23 %. The left ventricle has severely decreased function. The left ventricle demonstrates global hypokinesis. Left ventricular diastolic function could not be evaluated.  2. Right ventricular systolic function is severely reduced. The right ventricular size is moderately enlarged. There is mildly elevated pulmonary artery systolic pressure. The estimated right ventricular systolic pressure is 19.3 mmHg.  3. Left atrial size was severely dilated.  4. Right atrial size was severely dilated.  5. Moderate  pleural effusion in both left and right lateral regions.  6. The mitral valve is grossly normal. Severe mitral valve regurgitation.  7. Tricuspid valve regurgitation is moderate to severe.  8. The aortic valve is normal in structure. Aortic valve regurgitation is not visualized. No aortic stenosis is present.  EKG 01/21/2020: Sinus tachycardia at rate of 113 bpm, Right atrial enlargement, normal axis,  poor R wave progression, cannot exclude anteroseptal infarct old.  Borderline criteria for LVH.  Nonspecific T abnormality.   Medications:   Scheduled Meds: . Chlorhexidine Gluconate Cloth  6 each Topical Daily  . digoxin  0.125 mg Oral Daily  . feeding supplement (ENSURE ENLIVE)  237 mL Oral BID BM  . furosemide  40 mg Intravenous Q12H  . metoprolol tartrate  12.5 mg Oral BID  . multivitamin with minerals  1 tablet Oral Daily  . nutrition supplement (JUVEN)  1 packet Oral BID BM  . sildenafil  20 mg Oral TID   Continuous Infusions:  PRN Meds:.albuterol, LORazepam, ondansetron (ZOFRAN) IV, oxyCODONE-acetaminophen, polyethylene glycol   Assessment   1.  Acute decompensated heart failure, acute systolic biventricular heart failure 2.  CT findings suggestive of pulmonary hypertension with right atrial enlargement and hepatic reflux of contrast. 3.  Sinus tachycardia/brief atrial tachycardia and occasional PVCs 4.  Moderate bilateral pleural effusions 5.  Abnormal LFTs related to hepatic congestion 6.  Stage IIIa chronic kidney disease, renal function slowly improving, blood pressure is soft.  Patient has not received any Entresto due to soft blood pressure. 7.  Mitral valvular heart disease  Recommendations:   I have discussed patient's presentation with her daughter who is present at bedside, she is thankful for the care her mother is receiving at [facility].   Her blood pressure remains soft with systolic blood pressures at the time of evaluation and 90 mmHg range.  She is diuresing well and kidney function is improving.  Would recommend slowly uptitrating guideline directed medical therapy as her hemodynamics and laboratory values allow.  Spoke to both the patient and her daughter that the patient is quite ill and recommend medical therapy. They are in agreement and do not want to proceed with invasive procedures at this time (i.e. catheterization).    Continue digoxin,  continue low-dose beta-blocker with metoprolol 12.5 mg p.o. twice daily.  Medical therapy in view of advanced age, no intervention planned. Continue to monitor renal function and hepatic function and BNP.   Rex Kras, [place], [facility]  Pager: [phone] Office: [phone]

## 2020-02-03 DIAGNOSIS — N179 Acute kidney failure, unspecified: Secondary | ICD-10-CM

## 2020-02-03 DIAGNOSIS — R7989 Other specified abnormal findings of blood chemistry: Secondary | ICD-10-CM

## 2020-02-03 LAB — CBC WITH DIFFERENTIAL/PLATELET
Abs Immature Granulocytes: 0.11 10*3/uL — ABNORMAL HIGH (ref 0.00–0.07)
Abs Immature Granulocytes: 0.06 10*3/uL (ref 0.00–0.07)
Basophils Absolute: 0 10*3/uL (ref 0.0–0.1)
Basophils Absolute: 0 10*3/uL (ref 0.0–0.1)
Basophils Relative: 0 %
Basophils Relative: 0 %
Eosinophils Absolute: 0 10*3/uL (ref 0.0–0.5)
Eosinophils Absolute: 0 10*3/uL (ref 0.0–0.5)
Eosinophils Relative: 0 %
Eosinophils Relative: 0 %
HCT: 40.4 % (ref 36.0–46.0)
HCT: 44.6 % (ref 36.0–46.0)
Hemoglobin: 13.2 g/dL (ref 12.0–15.0)
Hemoglobin: 14.5 g/dL (ref 12.0–15.0)
Immature Granulocytes: 1 %
Immature Granulocytes: 1 %
Lymphocytes Relative: 8 %
Lymphocytes Relative: 8 %
Lymphs Abs: 1 10*3/uL (ref 0.7–4.0)
Lymphs Abs: 1.1 10*3/uL (ref 0.7–4.0)
MCH: 28.3 pg (ref 26.0–34.0)
MCH: 28.6 pg (ref 26.0–34.0)
MCHC: 32.7 g/dL (ref 30.0–36.0)
MCHC: 32.5 g/dL (ref 30.0–36.0)
MCV: 86.7 fL (ref 80.0–100.0)
MCV: 88 fL (ref 80.0–100.0)
Monocytes Absolute: 0.8 10*3/uL (ref 0.1–1.0)
Monocytes Absolute: 0.8 10*3/uL (ref 0.1–1.0)
Monocytes Relative: 7 %
Monocytes Relative: 6 %
Neutro Abs: 10.3 10*3/uL — ABNORMAL HIGH (ref 1.7–7.7)
Neutro Abs: 11.3 10*3/uL — ABNORMAL HIGH (ref 1.7–7.7)
Neutrophils Relative %: 84 %
Neutrophils Relative %: 85 %
Platelets: 69 10*3/uL — ABNORMAL LOW (ref 150–400)
Platelets: 75 10*3/uL — ABNORMAL LOW (ref 150–400)
RBC: 4.66 MIL/uL (ref 3.87–5.11)
RBC: 5.07 MIL/uL (ref 3.87–5.11)
RDW: 15.2 % (ref 11.5–15.5)
RDW: 15.4 % (ref 11.5–15.5)
WBC: 12.3 10*3/uL — ABNORMAL HIGH (ref 4.0–10.5)
WBC: 13.3 10*3/uL — ABNORMAL HIGH (ref 4.0–10.5)
nRBC: 0 % (ref 0.0–0.2)
nRBC: 0 % (ref 0.0–0.2)

## 2020-02-03 LAB — COMPREHENSIVE METABOLIC PANEL
ALT: 225 U/L — ABNORMAL HIGH (ref 0–44)
AST: 274 U/L — ABNORMAL HIGH (ref 15–41)
Albumin: 2.3 g/dL — ABNORMAL LOW (ref 3.5–5.0)
Alkaline Phosphatase: 106 U/L (ref 38–126)
Anion gap: 10 (ref 5–15)
BUN: 35 mg/dL — ABNORMAL HIGH (ref 8–23)
CO2: 30 mmol/L (ref 22–32)
Calcium: 7.9 mg/dL — ABNORMAL LOW (ref 8.9–10.3)
Chloride: 94 mmol/L — ABNORMAL LOW (ref 98–111)
Creatinine, Ser: 0.68 mg/dL (ref 0.44–1.00)
GFR calc Af Amer: >60 mL/min (ref >60)
GFR calc non Af Amer: >60 mL/min (ref >60)
Glucose, Bld: 111 mg/dL — ABNORMAL HIGH (ref 70–99)
Potassium: 2.5 mmol/L — CL (ref 3.5–5.1)
Sodium: 134 mmol/L — ABNORMAL LOW (ref 135–145)
Total Bilirubin: 1.6 mg/dL — ABNORMAL HIGH (ref 0.3–1.2)
Total Protein: 5.2 g/dL — ABNORMAL LOW (ref 6.5–8.1)

## 2020-02-03 LAB — LACTIC ACID, PLASMA: Lactic Acid, Venous: 1.1 mmol/L (ref 0.5–1.9)

## 2020-02-03 LAB — MAGNESIUM: Magnesium: 1.7 mg/dL (ref 1.7–2.4)

## 2020-02-03 LAB — BRAIN NATRIURETIC PEPTIDE: B Natriuretic Peptide: 1815.1 pg/mL — ABNORMAL HIGH (ref 0.0–100.0)

## 2020-02-03 MED ORDER — POTASSIUM CHLORIDE CRYS ER 20 MEQ PO TBCR: 40.0000 meq | EXTENDED_RELEASE_TABLET | Freq: Once | ORAL | Status: DC

## 2020-02-03 MED ORDER — POTASSIUM CHLORIDE CRYS ER 20 MEQ PO TBCR
40.0000 meq | EXTENDED_RELEASE_TABLET | ORAL | Status: AC
  Administered 2020-02-03 (×3): 40 meq via ORAL
  Filled 2020-02-03 (×3): qty 2

## 2020-02-03 MED ORDER — POTASSIUM CHLORIDE 10 MEQ/100ML IV SOLN
10.0000 meq | INTRAVENOUS | Status: DC
  Administered 2020-02-03 (×3): 10 meq via INTRAVENOUS
  Filled 2020-02-03 (×5): qty 100

## 2020-02-03 MED ORDER — MAGNESIUM SULFATE 2 GM/50ML IV SOLN
2.0000 g | Freq: Once | INTRAVENOUS | Status: AC
  Administered 2020-02-03: 2 g via INTRAVENOUS
  Filled 2020-02-03: qty 50

## 2020-02-03 NOTE — Progress Notes (Signed)
Subjective:  Patient seen and examined at bedside at approximately 8:30 AM. Patient is accompanied by her daughter. Denies any chest pain or shortness of breath. No events overnight per nursing staff.  Intake/Output: Net IO Since Admission: -5,623.65 mL [02/03/20 1909]  Today's Vitals   02/03/20 1629 02/03/20 1700 02/03/20 1800 02/03/20 1850  BP: (!) 92/38 (!) 96/39 (!) 97/49   Pulse: 87 90 91   Resp: '11 14 15   ' Temp:      TempSrc:      SpO2: 92% 93% 91%   Weight:      Height:      PainSc: 0-No pain   0-No pain   Body mass index is 19.88 kg/m.  Physical Exam  Cardiovascular: Normal rate, regular rhythm and intact distal pulses. Exam reveals no gallop.  Murmur heard. High-pitched blowing holosystolic murmur is present with a grade of 3/6 at the apex. JVD 1/2 way up the neck.  No leg edema.  Pulmonary/Chest: Effort normal and breath sounds normal. Crackles and rhonchi noted bilaterally. Abdominal: Soft. Bowel sounds are normal.   Lab Results: BMP BNP (last 3 results) Recent Labs    01/30/20 1336 02/02/20 0632 02/03/20 0214  BNP 2,893.5* 2,262.4* 1,815.1*    ProBNP (last 3 results) No results for input(s): PROBNP in the last 8760 hours. BMP Latest Ref Rng & Units 02/03/2020 02/02/2020 02/01/2020  Glucose 70 - 99 mg/dL 111(H) 122(H) 123(H)  BUN 8 - 23 mg/dL 35(H) 56(H) 57(H)  Creatinine 0.44 - 1.00 mg/dL 0.68 1.02(H) 1.36(H)  Sodium 135 - 145 mmol/L 134(L) 131(L) 132(L)  Potassium 3.5 - 5.1 mmol/L 2.5(LL) 3.9 4.9  Chloride 98 - 111 mmol/L 94(L) 98 99  CO2 22 - 32 mmol/L 30 23 20(L)  Calcium 8.9 - 10.3 mg/dL 7.9(L) 7.7(L) 8.4(L)   Hepatic Function Latest Ref Rng & Units 02/03/2020 02/02/2020 02/01/2020  Total Protein 6.5 - 8.1 g/dL 5.2(L) 5.7(L) 6.2(L)  Albumin 3.5 - 5.0 g/dL 2.3(L) 2.6(L) 3.0(L)  AST 15 - 41 U/L 274(H) 392(H) 656(H)  ALT 0 - 44 U/L 225(H) 291(H) 358(H)  Alk Phosphatase 38 - 126 U/L 106 127(H) 149(H)  Total Bilirubin 0.3 - 1.2 mg/dL 1.6(H) 1.2  2.0(H)  Bilirubin, Direct 0.0 - 0.2 mg/dL - - -   CBC Latest Ref Rng & Units 02/03/2020 02/03/2020 02/01/2020  WBC 4.0 - 10.5 K/uL 13.3(H) 12.3(H) 11.6(H)  Hemoglobin 12.0 - 15.0 g/dL 14.5 13.2 13.4  Hematocrit 36.0 - 46.0 % 44.6 40.4 42.3  Platelets 150 - 400 K/uL 75(L) 69(L) 79(L)   Lipid Panel  No results found for: CHOL, TRIG, HDL, CHOLHDL, VLDL, LDLCALC, LDLDIRECT Cardiac Panel (last 3 results) No results for input(s): CKTOTAL, CKMB, TROPONINI, RELINDX in the last 72 hours.  HEMOGLOBIN A1C No results found for: HGBA1C, MPG TSH Recent Labs    01/30/20 1338  TSH 1.554    Radiology:   Imaging:  Imaging Results (Last 48 hours)  CT Head Wo Contrast  Result Date: 01/30/2020 CLINICAL DATA:  Generalized weakness. EXAM: CT HEAD WITHOUT CONTRAST TECHNIQUE: Contiguous axial images were obtained from the base of the skull through the vertex without intravenous contrast. COMPARISON:  Jan 27, 2020 FINDINGS: Brain: There is mild cerebral atrophy with widening of the extra-axial spaces and ventricular dilatation. There are areas of decreased attenuation within the white matter tracts of the supratentorial brain, consistent with microvascular disease changes. Vascular: No hyperdense vessel or unexpected calcification. Skull: Normal. Negative for fracture or focal lesion. Sinuses/Orbits: No acute finding. Other:  None. IMPRESSION: 1. Generalized cerebral atrophy. 2. No acute intracranial abnormality. Electronically Signed   By: Virgina Norfolk M.D.   On: 01/30/2020 15:37   US RENAL  Result Date: 01/31/2020 CLINICAL DATA:  Acute kidney injury.  History of bladder suspension EXAM: RENAL / URINARY TRACT ULTRASOUND COMPLETE COMPARISON:  None. FINDINGS: Right Kidney: Renal measurements: 8.7 x 3.5 x 5.1 cm = volume: 81 mL . Echogenicity within normal limits. No mass or hydronephrosis visualized. Left Kidney: Renal measurements: 9.3 x 4.2 x 4.0 cm = volume: 81.1 mL. Echogenicity within normal limits.  No mass. Pelvocaliectasis noted. Bladder: Contains of Foley catheter.  Otherwise unremarkable. Other: Bilateral pleural effusions noted.  Gallbladder sludge present. IMPRESSION: 1. Small bilateral kidneys. No evidence for high-grade obstructive uropathy. 2. Left pelvocaliectasis. Electronically Signed   By: Kerby Moors M.D.   On: 01/31/2020 12:00   DG CHEST PORT 1 VIEW  Result Date: 01/31/2020 CLINICAL DATA:  Follow-up pneumonia EXAM: PORTABLE CHEST 1 VIEW COMPARISON:  01/30/2019 FINDINGS: Cardiomegaly, vascular congestion. Improving bilateral airspace disease. Continued bilateral lower lobe consolidation, left greater than right. No visible significant effusions or pneumothorax. IMPRESSION: Improving bilateral airspace disease with residual bibasilar opacities. Electronically Signed   By: Rolm Baptise M.D.   On: 01/31/2020 13:14   DG Chest Portable 1 View  Result Date: 01/30/2020 CLINICAL DATA:  Shortness of breath EXAM: PORTABLE CHEST 1 VIEW COMPARISON:  01/27/2020 FINDINGS: Stable cardiomegaly. Atherosclerotic calcification of the aortic knob. Small bilateral pleural effusions with associated bibasilar opacities. Bilateral interstitial prominence suggesting mild edema. No pneumothorax is seen. IMPRESSION: Findings suggesting CHF with mild edema and bilateral pleural effusions. Electronically Signed   By: Davina Poke D.O.   On: 01/30/2020 14:23   US Abdomen Limited RUQ  Result Date: 01/30/2020 CLINICAL DATA:  Abnormal LFTs EXAM: ULTRASOUND ABDOMEN LIMITED RIGHT UPPER QUADRANT COMPARISON:  None. FINDINGS: Gallbladder: Gallbladder is well distended with gallbladder sludge within. No definitive cholelithiasis is seen. No wall thickening or pericholecystic fluid is noted. Common bile duct: Diameter: 8 mm which is within normal limits for the patient's given age. Liver: No focal lesion identified. Within normal limits in parenchymal echogenicity. Portal vein is patent on color Doppler imaging  with normal direction of blood flow towards the liver. Other: Small right pleural effusion is noted. IMPRESSION: Gallbladder sludge without cholelithiasis. Small right pleural effusion. Electronically Signed   By: Inez Catalina M.D.   On: 01/30/2020 18:47    CT angiogram chest 01/27/2020: Cardiovascular: This is a technically adequate evaluation of the pulmonary vasculature. There are no filling defects or pulmonary emboli.  Heart is mildly enlarged, with prominent biatrial dilatation, right greater than left. Reflux of contrast into the hepatic veins suggest a degree of cardiac dysfunction.  Thoracic aorta demonstrates normal caliber, with extensive atherosclerosis.  Mediastinum/Nodes: No enlarged mediastinal, hilar, or axillary lymph nodes. Thyroid gland, trachea, and esophagus demonstrate no significant findings.  Cardiac Studies:   Echocardiogram 01/31/2020:    1. Left ventricular ejection fraction, by estimation, is <20%. Left ventricular ejection fraction by PLAX is 23 %. The left ventricle has severely decreased function. The left ventricle demonstrates global hypokinesis. Left ventricular diastolic function could not be evaluated.  2. Right ventricular systolic function is severely reduced. The right ventricular size is moderately enlarged. There is mildly elevated pulmonary artery systolic pressure. The estimated right ventricular systolic pressure is 77.4 mmHg.  3. Left atrial size was severely dilated.  4. Right atrial size was severely dilated.  5. Moderate pleural effusion  in both left and right lateral regions.  6. The mitral valve is grossly normal. Severe mitral valve regurgitation.  7. Tricuspid valve regurgitation is moderate to severe.  8. The aortic valve is normal in structure. Aortic valve regurgitation is not visualized. No aortic stenosis is present.  EKG 01/21/2020: Sinus tachycardia at rate of 113 bpm, Right atrial enlargement, normal axis, poor R wave  progression, cannot exclude anteroseptal infarct old.  Borderline criteria for LVH.  Nonspecific T abnormality.   Medications:   Scheduled Meds: . Chlorhexidine Gluconate Cloth  6 each Topical Daily  . digoxin  0.125 mg Oral Daily  . feeding supplement (ENSURE ENLIVE)  237 mL Oral BID BM  . furosemide  40 mg Intravenous Q12H  . metoprolol tartrate  12.5 mg Oral BID  . multivitamin with minerals  1 tablet Oral Daily  . nutrition supplement (JUVEN)  1 packet Oral BID BM  . sildenafil  20 mg Oral TID   Continuous Infusions:  PRN Meds:.albuterol, LORazepam, ondansetron (ZOFRAN) IV, oxyCODONE-acetaminophen, polyethylene glycol   Assessment   1.  Acute decompensated heart failure, acute systolic biventricular heart failure 2.  CT findings suggestive of pulmonary hypertension with right atrial enlargement and hepatic reflux of contrast. 3.  Sinus tachycardia/brief atrial tachycardia and occasional PVCs 4.  Moderate bilateral pleural effusions 5.  Abnormal LFTs related to hepatic congestion: improving.  6.  Stage IIIa chronic kidney disease, renal function slowly improving, blood pressure is soft.  Patient has not received any Entresto due to soft blood pressure. 7.  Mitral valvular heart disease  Recommendations:   I have discussed patient's presentation with her daughter who is present at bedside, she is thankful for the care her mother is receiving at [facility].   From a cardiovascular standpoint patient is diuresing well.  Her blood pressures remain soft.  Patient was noted to have ventricular ectopy on telemetry mostly secondary to hypokalemia.  Recommend keeping the potassium at 4 and magnesium at 2 and rechecking electrolytes often as patient is currently getting diuresed.  Objectively patient is improving as her transaminitis is trending down, BNP trending down.  Patient's bicarbonate is currently 30.  Continue to monitor as she may be closer to contraction alkalosis.  May  consider transitioning from IV diuretics to oral in the next 24 to 48 hours.    Her blood pressure remains soft with systolic blood pressures at the time of evaluation and 90 mmHg range.   Would recommend slowly uptitrating guideline directed medical therapy as her hemodynamics and laboratory values allow.  Spoke to both the patient and her daughter that the patient is quite ill and recommend medical therapy. They are in agreement and do not want to proceed with invasive procedures at this time (i.e. catheterization).    Continue digoxin, continue low-dose beta-blocker with metoprolol 12.5 mg p.o. twice daily.  Medical therapy in view of advanced age, no intervention planned. Continue to monitor renal function and hepatic function and BNP.   Rex Kras, [place], [facility]  Pager: [phone] Office: [phone]

## 2020-02-03 NOTE — Evaluation (Signed)
Occupational Therapy Evaluation Patient Details Name: Anita Martinez MRN: [id number] DOB: 06-30-1933 Today's Date: 02/03/2020    History of Present Illness 86yo female admitted 01/31/20 with generalized weakness, unable to stand/walk, dyspnea on exertion. PMH: HOH, chronic back pain, opiod dependence, anxiety, depression, HLD. HeadCT = no acute abnormality, generalized cerebral atrophy. CXR = CHF, mild edema, B pleural effusions , issues with low BP   Clinical Impression   Patient with functional deficits listed below impacting safety and independence with self care. Patient supervision for bed mobility, min A for functional transfers and ambulation using rolling walker for steadying assist. Patient ambulate approximately 100 ft with min A. Min cues for safety with hand placement during transfers. Will continue to follow.    Follow Up Recommendations  Home health OT;Supervision/Assistance - 24 hour    Equipment Recommendations  Other (comment)(walker if does not have)       Precautions / Restrictions Precautions Precautions: Fall Precaution Comments: monitor BP Restrictions Weight Bearing Restrictions: No      Mobility Bed Mobility Overal bed mobility: Needs Assistance Bed Mobility: Supine to Sit;Sit to Supine     Supine to sit: Supervision Sit to supine: Supervision   General bed mobility comments: extra time  Transfers Overall transfer level: Needs assistance Equipment used: Rolling walker (2 wheeled) Transfers: Sit to/from Stand Sit to Stand: Min assist         General transfer comment: steady assist    Balance Overall balance assessment: Needs assistance Sitting-balance support: No upper extremity supported;Feet supported Sitting balance-Leahy Scale: Good     Standing balance support: During functional activity;Bilateral upper extremity supported Standing balance-Leahy Scale: Fair Standing balance comment: can stand without walker, increased safety with B  UE support                           ADL either performed or assessed with clinical judgement   ADL Overall ADL's : Needs assistance/impaired     Grooming: Set up;Sitting   Upper Body Bathing: Set up;Sitting   Lower Body Bathing: Minimal assistance;Sit to/from stand   Upper Body Dressing : Set up;Sitting   Lower Body Dressing: Minimal assistance;Sit to/from stand Lower Body Dressing Details (indicate cue type and reason): for balance Toilet Transfer: Minimal assistance;Ambulation;RW Toilet Transfer Details (indicate cue type and reason): simulated with functional ambulation/mobility, min A for safety due to mild unsteadiness Toileting- Clothing Manipulation and Hygiene: Minimal assistance;Sit to/from stand       Functional mobility during ADLs: Minimal assistance;Rolling walker General ADL Comments: patient requires increased assistance with self care due to decreased strength, activity tolerance, balance                  Pertinent Vitals/Pain Pain Assessment: No/denies pain     Hand Dominance Right   Extremity/Trunk Assessment Upper Extremity Assessment Upper Extremity Assessment: Generalized weakness   Lower Extremity Assessment Lower Extremity Assessment: Defer to PT evaluation   Cervical / Trunk Assessment Cervical / Trunk Assessment: Kyphotic   Communication Communication Communication: HOH;No difficulties   Cognition Arousal/Alertness: Awake/alert Behavior During Therapy: WFL for tasks assessed/performed Overall Cognitive Status: Within Functional Limits for tasks assessed                                     General Comments  patient fluctuate between 88-95% on room air, primarily above 90% on room air throughout  Home Living Family/patient expects to be discharged to:: Private residence Living Arrangements: Alone Available Help at Discharge: Family;Available PRN/intermittently Type of Home: Mobile  home Home Access: Stairs to enter Entrance Stairs-Number of Steps: 4 Entrance Stairs-Rails: Right Home Layout: One level     Bathroom Shower/Tub: Chief Strategy Officer: Standard     Home Equipment: Cane - single point;Shower seat;Hand held shower head          Prior Functioning/Environment Level of Independence: Independent        Comments: drives, 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[employer], has a Tree surgeon Problem List: Decreased strength;Decreased activity tolerance;Impaired balance (sitting and/or standing);Decreased safety awareness      OT Treatment/Interventions: Self-care/ADL training;Therapeutic exercise;Energy conservation;DME and/or AE instruction;Therapeutic activities;Patient/family education;Balance training    OT Goals(Current goals can be found in the care plan section) Acute Rehab OT Goals Patient Stated Goal: go home, to church OT Goal Formulation: With patient Time For Goal Achievement: 02/17/20 Potential to Achieve Goals: Good  OT Frequency: Min 2X/week    AM-PAC OT "6 Clicks" Daily Activity     Outcome Measure Help from another person eating meals?: None Help from another person taking care of personal grooming?: A Little Help from another person toileting, which includes using toliet, bedpan, or urinal?: A Little Help from another person bathing (including washing, rinsing, drying)?: A Little Help from another person to put on and taking off regular upper body clothing?: A Little Help from another person to put on and taking off regular lower body clothing?: A Little 6 Click Score: 19   End of Session Equipment Utilized During Treatment: Rolling walker  Activity Tolerance: Patient tolerated treatment well Patient left: in bed;with call bell/phone within reach;with family/visitor present  OT Visit Diagnosis: Unsteadiness on feet (R26.81);Muscle weakness (generalized) (M62.81)                Time: 6160-7371 OT Time Calculation (min): 23  min Charges:  OT General Charges $OT Visit: 1 Visit OT Evaluation $OT Eval Moderate Complexity: 1 Mod OT Treatments $Self Care/Home Management : 8-22 mins  Marlyce Huge OT Pager: [phone]  Carmelia Roller 02/03/2020, 10:03 AM

## 2020-02-03 NOTE — Progress Notes (Addendum)
CRITICAL VALUE ALERT  Critical Value:  Potassium 2.5  Date & Time Notied:  02/03/20 8270  Provider Notified: Dr Anna Genre  Orders Received/Actions taken: 2g Magnesium 40 Meq potassium IV

## 2020-02-03 NOTE — Progress Notes (Signed)
PROGRESS NOTE    Anita Martinez  GUR:[id number] DOB: 02-Nov-1932 DOA: 01/30/2020 PCP: [name]   Chief Complaint  Patient presents with  . Weakness  . 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 Dutch Quint group consulted, there is some confusion about CODE STATUS (please refer note from Dr. Gerri Lins on May 12), will need to continue conversation with patient and daughter   Subjective: Per RN patient Had hypoxia o2 sats 86% last night, she is put on oxygen 2liter,  She reports feeling better, Urine output 4.1 liter last 24hrs, cr/lft continue to improve She denies chest pain,  no edema Reports legs are feeling better Daughter at bedside    Assessment & Plan:   Principal Problem:   Acute CHF (congestive heart failure) (HCC) Active Problems:   Pressure injury of skin   Acute heart failure (HCC)  Severe biventicular heart failure with pleural effusion, liver congestion and acute renal failure, no prior history of known heart disease Appear slowly improving on current regiment, management per cardiology   AKI on CKD 2  -renal ultrasound demonstrates no obstructive uropathy --AKI likely due to cardiorenal syndrome, urine culture obtained from May 7 with Klebsiella pneumonia, UA on presentation on May 11 no bacteria seen, blood culture negative -She received total of 3 dose of Rocephin since admission,  antibiotics d/ced -BUN/creatinine peaked at 57/1.36, improving -Repeat BMP in the morning, renal dosing meds   There is a question for aspiration pneumonia.   Chest x-ray consistent with pulmonary edema and pleural effusion,  On dysphagia 3 diet, thin liquid per swallow eval, continue aspiration precaution Monitor off antibiotic for now  Abnormal liver function Likely due to liver congestion from heart failure Acute hepatitis panel negative lft trending down  Hypervolemic hyponatremia, -Likely from acute heart failure, currently on Lasix,  -sodium slowly improving  Hypokalemia/hypomagnesemia -Replace, recheck in the morning  Thrombocytopenia acute, from liver congestion versus antibiotics? -Unclear etiology, no exposure to heparin Total bilirubin  normal to mildly elevated, less likely hemolysis -No sign of bleeding - platelet appeared nadired at 69 , start to improve  - repeat CBC in the morning  QTC prolongation QTC 515 on presentation Home med amitriptyline discontinued QTC 471 on repeat Continue telemetry, keep potassium above 4, magnesium above 2  Stage II sacral pressure ulcer: Wound care is consulted  FTT; previously independent, driving, currently very weak, will need PT eval   DVT prophylaxis: SCDs  Code Status: There are some confusion about CODE STATUS, will keep full code for now, continue goals of care discussion with patient and daughter Family Communication: Daughter at bedside Disposition:   Status is: Inpatient  Dispo: The patient is from: Home              Anticipated d/c is to:     TBD          Anticipated d/c date is: >3days              Patient condition remains critical, continue in stepdown unit      Consultants:   Cardiology  Procedures: None  Antimicrobials: Rocephin from admission to May 13  Objective: Vitals:   02/03/20 0500 02/03/20 0600 02/03/20 0700 02/03/20 0800  BP: (!) 94/58 (!) 104/50 (!) 102/45 (!) 100/39  Pulse: 70 79 82 83  Resp: 12 12 10 12   Temp:      TempSrc:      SpO2: 94% 93% 91% 92%  Weight: 54.2 kg     Height:        Intake/Output Summary (Last 24 hours)  at 02/03/2020 0812 Last data filed at 02/03/2020 0600 Gross per 24 hour  Intake 202.16 ml  Output 4125 ml  Net -3922.84 ml   Filed Weights   02/01/20 0500 02/02/20 0500 02/03/20 0500  Weight: 46.7 kg 55.3 kg 54.2 kg    Examination:  General exam: frail, calm , NAD, hard of hearing Respiratory system: diminished at basis, some crackles, no wheezing, no rhonchi. Cardiovascular system: S1 & S2 heard, RRR. No JVD, no murmurs. No pedal edema. Gastrointestinal system: Abdomen is nondistended, soft and nontender. No organomegaly or masses felt. Normal bowel sounds heard. Central nervous system: Alert and oriented. No focal neurological deficits. Extremities: Symmetric, generalized weakness Skin: No rashes, lesions or ulcers Psychiatry: Judgement and insight appear normal. Mood & affect appropriate.     Data Reviewed: I have personally reviewed following labs and imaging studies  CBC: Recent Labs  Lab 01/27/20 1454 01/30/20 1336 01/31/20 0613 02/01/20 0240 02/03/20 0214  WBC 9.4 8.8 10.5 11.6* 12.3*  NEUTROABS  --   --   --  9.0* 10.3*  HGB 12.7 13.5 13.1 13.4 13.2  HCT 41.2 44.0 42.1 42.3 40.4  MCV 94.9 94.4 92.7 91.6 86.7  PLT 188 129* 133* 79* 69*    Basic Metabolic Panel: Recent Labs  Lab 01/30/20 1336 01/31/20 0613 02/01/20 0602 02/02/20 0242 02/03/20 0214  NA 136 133* 132* 131* 134*  K 4.7 4.5 4.9 3.9 2.5*  CL 104 102 99 98 94*  CO2 19* 18* 20* 23 30  GLUCOSE 113* 116* 123* 122* 111*  BUN 44* 47* 57* 56* 35*  CREATININE 0.97 0.96 1.36* 1.02* 0.68  CALCIUM 8.8* 8.5* 8.4* 7.7* 7.9*  MG  --   --   --   --  1.7    GFR: Estimated Creatinine Clearance: 44 mL/min (by C-G formula based on SCr of 0.68 mg/dL).  Liver Function Tests: Recent Labs  Lab 01/30/20 1336 01/31/20 0613 02/01/20 0602 02/02/20 0242 02/03/20 0214  AST 190* 237* 656* 392* 274*  ALT 184* 197* 358* 291* 225*  ALKPHOS 122 113 149* 127* 106  BILITOT 2.7* 1.9* 2.0* 1.2 1.6*  PROT 7.1 6.3*  6.2* 5.7* 5.2*  ALBUMIN 3.4* 3.1* 3.0* 2.6* 2.3*    CBG: Recent Labs  Lab 01/27/20 1610 01/30/20 1338 01/31/20 0600  GLUCAP 95 102* 124*     Recent Results (from the past 240 hour(s))  Urine culture     Status: Abnormal   Collection Time: 01/27/20  5:59 PM   Specimen: Urine, Clean Catch  Result Value Ref Range Status   Specimen Description   Final    URINE, CLEAN CATCH Performed at [facility], [place] [address], [place], [place] [address]    Special Requests   Final    NONE Performed at [facility], [place] [address] [place], [place] [address]    Culture >=100,000 COLONIES/mL KLEBSIELLA PNEUMONIAE (A)  Final   Report Status 01/30/2020 FINAL  Final   Organism ID, Bacteria KLEBSIELLA PNEUMONIAE (A)  Final      Susceptibility  Klebsiella pneumoniae - MIC*    AMPICILLIN >=32 RESISTANT Resistant     CEFAZOLIN <=4 SENSITIVE Sensitive     CEFTRIAXONE <=1 SENSITIVE Sensitive     CIPROFLOXACIN <=0.25 SENSITIVE Sensitive     GENTAMICIN <=1 SENSITIVE Sensitive     IMIPENEM <=0.25 SENSITIVE Sensitive     NITROFURANTOIN 64 INTERMEDIATE Intermediate     TRIMETH/SULFA <=20 SENSITIVE Sensitive     AMPICILLIN/SULBACTAM 8 SENSITIVE Sensitive     PIP/TAZO <=4 SENSITIVE Sensitive     * >=100,000 COLONIES/mL KLEBSIELLA PNEUMONIAE  Blood culture (routine x 2)     Status: None (Preliminary result)   Collection Time: 01/30/20  1:36 PM   Specimen: BLOOD  Result Value Ref Range Status   Specimen Description   Final    BLOOD LEFT ANTECUBITAL Performed at [facility], [address] [place], [place] [address]    Special Requests   Final    BOTTLES DRAWN AEROBIC AND ANAEROBIC Blood Culture adequate volume Performed at [facility], [address] [place], [place] [address]    Culture   Final    NO GROWTH 3 DAYS Performed at [facility], [address], [place], [place] [address]    Report Status PENDING   Incomplete  SARS Coronavirus 2 by RT PCR (hospital order, performed in [facility] hospital lab) Nasopharyngeal Nasopharyngeal Swab     Status: None   Collection Time: 01/30/20  3:06 PM   Specimen: Nasopharyngeal Swab  Result Value Ref Range Status   SARS Coronavirus 2 NEGATIVE NEGATIVE Final    Comment: (NOTE) SARS-CoV-2 target nucleic acids are NOT DETECTED. The SARS-CoV-2 RNA is generally detectable in upper and lower respiratory specimens during the acute phase of infection. The lowest concentration of SARS-CoV-2 viral copies this assay can detect is 250 copies / mL. A negative result does not preclude SARS-CoV-2 infection and should not be used as the sole basis for treatment or other patient management decisions.  A negative result may occur with improper specimen collection / handling, submission of specimen other than nasopharyngeal swab, presence of viral mutation(s) within the areas targeted by this assay, and inadequate number of viral copies (<250 copies / mL). A negative result must be combined with clinical observations, patient history, and epidemiological information. Fact Sheet for Patients:   BoilerBrush.com.cy Fact Sheet for Healthcare Providers: https://pope.com/ This test is not yet approved or cleared  by the [place] FDA and has been authorized for detection and/or diagnosis of SARS-CoV-2 by FDA under an Emergency Use Authorization (EUA).  This EUA will remain in effect (meaning this test can be used) for the duration of the COVID-19 declaration under Section 564(b)(1) of the Act, 21 U.S.C. section 360bbb-3(b)(1), unless the authorization is terminated or revoked sooner. Performed at [facility], [address], [place], [place] [address]   Urine culture     Status: None   Collection Time: 01/31/20  6:37 AM   Specimen: Urine, Catheterized  Result Value Ref Range Status   Specimen  Description   Final    URINE, CATHETERIZED Performed at [facility], [address] [place], [place] [address]    Special Requests   Final    NONE Performed at [facility], [address] [place], [place] [address]    Culture   Final    NO GROWTH Performed at [facility], [address], [place], [place] [address]    Report Status  02/01/2020 FINAL  Final  MRSA PCR Screening     Status: None   Collection Time: 01/31/20 12:27 PM   Specimen: Nasal Mucosa; Nasopharyngeal  Result Value Ref Range Status   MRSA by PCR NEGATIVE NEGATIVE Final    Comment:        The GeneXpert MRSA Assay (FDA approved for NASAL specimens only), is one component of a comprehensive MRSA colonization surveillance program. It is not intended to diagnose MRSA infection nor to guide or monitor treatment for MRSA infections. Performed at [facility], [address], [place], [place] [address]          Radiology Studies: No results found.      Scheduled Meds: . Chlorhexidine Gluconate Cloth  6 each Topical Daily  . digoxin  0.125 mg Oral Daily  . feeding supplement (ENSURE ENLIVE)  237 mL Oral BID BM  . furosemide  40 mg Intravenous Q12H  . metoprolol tartrate  12.5 mg Oral BID  . multivitamin with minerals  1 tablet Oral Daily  . nutrition supplement (JUVEN)  1 packet Oral BID BM  . potassium chloride  40 mEq Oral Q3H  . sildenafil  20 mg Oral TID   Continuous Infusions:   LOS: 3 days     Time spent: 35 mins I have personally reviewed and interpreted on  02/03/2020 daily labs, tele strips, imagings as discussed above under date review session and assessment and plans.  I reviewed all nursing notes, pharmacy notes, consultant notes,  vitals, pertinent old records  I have discussed plan of care as described above with RN , patient and family on 02/03/2020  Voice Recognition /Dragon dictation system was used to create  this note, attempts have been made to correct errors. Please contact the author with questions and/or clarifications.   Albertine Grates, MD PhD FACP [facility]  Available via Epic secure chat 7am-7pm for nonurgent issues Please page for urgent issues To page the attending provider between 7A-7P or the covering provider during after hours 7P-7A, please log into the web site [email or web address] and access using universal [place] password for that web site. If you do not have the password, please call the hospital operator.    02/03/2020, 8:12 AM

## 2020-02-04 LAB — CULTURE, BLOOD (ROUTINE X 2)
Culture: NO GROWTH
Special Requests: ADEQUATE

## 2020-02-04 LAB — CBC WITH DIFFERENTIAL/PLATELET
Abs Immature Granulocytes: 0.08 10*3/uL — ABNORMAL HIGH (ref 0.00–0.07)
Basophils Absolute: 0 10*3/uL (ref 0.0–0.1)
Basophils Relative: 0 %
Eosinophils Absolute: 0.1 10*3/uL (ref 0.0–0.5)
Eosinophils Relative: 1 %
HCT: 43 % (ref 36.0–46.0)
Hemoglobin: 14 g/dL (ref 12.0–15.0)
Immature Granulocytes: 1 %
Lymphocytes Relative: 11 %
Lymphs Abs: 1.7 10*3/uL (ref 0.7–4.0)
MCH: 27.9 pg (ref 26.0–34.0)
MCHC: 32.6 g/dL (ref 30.0–36.0)
MCV: 85.8 fL (ref 80.0–100.0)
Monocytes Absolute: 1.2 10*3/uL — ABNORMAL HIGH (ref 0.1–1.0)
Monocytes Relative: 8 %
Neutro Abs: 12.4 10*3/uL — ABNORMAL HIGH (ref 1.7–7.7)
Neutrophils Relative %: 79 %
Platelets: 84 10*3/uL — ABNORMAL LOW (ref 150–400)
RBC: 5.01 MIL/uL (ref 3.87–5.11)
RDW: 15.2 % (ref 11.5–15.5)
WBC: 15.5 10*3/uL — ABNORMAL HIGH (ref 4.0–10.5)
nRBC: 0 % (ref 0.0–0.2)

## 2020-02-04 LAB — COMPREHENSIVE METABOLIC PANEL
ALT: 175 U/L — ABNORMAL HIGH (ref 0–44)
AST: 191 U/L — ABNORMAL HIGH (ref 15–41)
Albumin: 2.6 g/dL — ABNORMAL LOW (ref 3.5–5.0)
Alkaline Phosphatase: 103 U/L (ref 38–126)
Anion gap: 9 (ref 5–15)
BUN: 24 mg/dL — ABNORMAL HIGH (ref 8–23)
CO2: 32 mmol/L (ref 22–32)
Calcium: 7.9 mg/dL — ABNORMAL LOW (ref 8.9–10.3)
Chloride: 91 mmol/L — ABNORMAL LOW (ref 98–111)
Creatinine, Ser: 0.69 mg/dL (ref 0.44–1.00)
GFR calc Af Amer: >60 mL/min (ref >60)
GFR calc non Af Amer: >60 mL/min (ref >60)
Glucose, Bld: 111 mg/dL — ABNORMAL HIGH (ref 70–99)
Potassium: 4.9 mmol/L (ref 3.5–5.1)
Sodium: 132 mmol/L — ABNORMAL LOW (ref 135–145)
Total Bilirubin: 1.6 mg/dL — ABNORMAL HIGH (ref 0.3–1.2)
Total Protein: 5.7 g/dL — ABNORMAL LOW (ref 6.5–8.1)

## 2020-02-04 LAB — PROTIME-INR
INR: 2.3 — ABNORMAL HIGH (ref 0.8–1.2)
Prothrombin Time: 24.8 seconds — ABNORMAL HIGH (ref 11.4–15.2)

## 2020-02-04 LAB — BRAIN NATRIURETIC PEPTIDE: B Natriuretic Peptide: 1351.8 pg/mL — ABNORMAL HIGH (ref 0.0–100.0)

## 2020-02-04 MED ORDER — SACUBITRIL-VALSARTAN 24-26 MG PO TABS
1.0000 | ORAL_TABLET | Freq: Two times a day (BID) | ORAL | Status: DC
  Administered 2020-02-04: 1 via ORAL
  Filled 2020-02-04 (×2): qty 1

## 2020-02-04 MED ORDER — IVABRADINE HCL 5 MG PO TABS
5.0000 mg | ORAL_TABLET | Freq: Two times a day (BID) | ORAL | Status: DC
  Administered 2020-02-04 – 2020-02-06 (×5): 5 mg via ORAL
  Filled 2020-02-04 (×7): qty 1

## 2020-02-04 MED ORDER — BISACODYL 10 MG RE SUPP
10.0000 mg | Freq: Every day | RECTAL | Status: AC
  Administered 2020-02-04: 10 mg via RECTAL
  Filled 2020-02-04 (×2): qty 1

## 2020-02-04 MED ORDER — TORSEMIDE 20 MG PO TABS
20.0000 mg | ORAL_TABLET | Freq: Every day | ORAL | Status: DC
  Administered 2020-02-04 – 2020-02-08 (×5): 20 mg via ORAL
  Filled 2020-02-04 (×6): qty 1

## 2020-02-04 MED ORDER — SENNOSIDES-DOCUSATE SODIUM 8.6-50 MG PO TABS
1.0000 | ORAL_TABLET | Freq: Two times a day (BID) | ORAL | Status: DC
  Administered 2020-02-04 – 2020-02-08 (×8): 1 via ORAL
  Filled 2020-02-04 (×8): qty 1

## 2020-02-04 NOTE — Progress Notes (Addendum)
PROGRESS NOTE    Anita Martinez  ZOX:[id number] DOB: 1933/04/17 DOA: 01/30/2020 PCP: Leeroy Cha   Chief Complaint  Patient presents with  . Weakness  . 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 Antionette Char group consulted, there is some confusion about CODE STATUS (please refer note from Dr. Benny Lennert on May 12), will need to continue conversation with patient and daughter   Subjective:  Had hypoxia o2 sats 87% last nightx1, per RN hypoxia appear to be positional She is on room air this morning, o2 sats at 91%,  She denies chest pain, no cough,  no edema,  Urine output 2.2 liter last 24hrs, cr has normalized, lft continue to improve Reports no bm since in the hospital, no ab pain, no n/v She reports overall  feeling better, she denies pain this am Daughter at bedside    Assessment & Plan:   Principal Problem:   Acute CHF (congestive heart failure) ([place]) Active Problems:   Pressure injury of skin   Acute heart failure (HCC)   Abnormal LFTs   AKI (acute kidney injury) ([place])  Severe biventicular heart failure with pleural effusion, liver congestion and acute renal failure, no prior history of known heart disease Appear slowly improving on current regiment, management per cardiology   AKI on CKD 2  -renal ultrasound demonstrates no obstructive uropathy --AKI likely due to cardiorenal syndrome, urine culture obtained from May 7 with Klebsiella pneumonia, UA on presentation on May 11 no bacteria seen, blood culture negative -She received total of 3 dose of  Rocephin since admission,  antibiotics d/ced -BUN/creatinine peaked at 57/1.36, now normalized -monitor, renal dosing meds   There is a question for aspiration pneumonia.  Chest x-ray consistent with pulmonary edema and pleural effusion,  On dysphagia 3 diet, thin liquid per swallow eval, continue aspiration precaution Monitor off antibiotic for now  Abnormal liver function Likely due to liver congestion from heart failure Acute hepatitis panel negative lft trending down  Hypervolemic hyponatremia, -Likely from acute heart failure, currently on Lasix,  -sodium nadir at 131, slowly improving  Hypokalemia/hypomagnesemia -Replaced,  recheck in the morning  Thrombocytopenia acute, from liver congestion versus antibiotics? -Unclear etiology, no exposure to heparin Total bilirubin  normal to mildly elevated, less likely hemolysis -No sign of bleeding - platelet appeared nadired at 69 , starts to improve  - repeat CBC in the morning  QTC prolongation QTC 515 on presentation Home med amitriptyline discontinued QTC 471 on repeat Continue telemetry, keep potassium above 4, magnesium above 2  Stage II sacral pressure ulcer: Wound care is consulted  Constipation : start bowel regimen. Increase activity   H/o colon resection with chronic abdominal wall hernia  FTT; previously independent, driving, currently very weak, will need PT eval   DVT prophylaxis: SCDs  Code Status: DNR, confirmed with patient /duahgter in room with the presence of RN Family Communication: Daughter at bedside Disposition:   Status is: Inpatient  Dispo: The patient is from: Home              Anticipated d/c is to:     TBD  Anticipated d/c date is: 2-3 days              Patient condition remains critical, continue in stepdown unit today, maybe able to more to floor tomorrow      Consultants:   Cardiology  Procedures: None  Antimicrobials: Rocephin from admission to May  13    Objective: Vitals:   02/04/20 0400 02/04/20 0500 02/04/20 0600 02/04/20 0700  BP: (!) 88/41 (!) 102/43 (!) 104/47 (!) 96/47  Pulse: 84 87 87 81  Resp: 13 14 15 12   Temp: 97.8 F (36.6 C)     TempSrc: Oral     SpO2: 93% 93% 94% 90%  Weight:  54.8 kg    Height:        Intake/Output Summary (Last 24 hours) at 02/04/2020 0742 Last data filed at 02/04/2020 0000 Gross per 24 hour  Intake 676.05 ml  Output 2200 ml  Net -1523.95 ml   Filed Weights   02/02/20 0500 02/03/20 0500 02/04/20 0500  Weight: 55.3 kg 54.2 kg 54.8 kg    Examination:  General exam: frail, calm , NAD, hard of hearing Respiratory system: diminished at basis, some crackles, no wheezing, no rhonchi. Cardiovascular system: S1 & S2 heard, RRR. No JVD, no murmurs. No pedal edema. Gastrointestinal system: abdominal wall hernia, reducible, nontender,  nondistended,t. Normal bowel sounds heard. Central nervous system: Alert and oriented. No focal neurological deficits. Extremities: Symmetric, generalized weakness Skin: No rashes, lesions or ulcers Psychiatry: Judgement and insight appear normal. Mood & affect appropriate.     Data Reviewed: I have personally reviewed following labs and imaging studies  CBC: Recent Labs  Lab 01/30/20 1336 01/31/20 0613 02/01/20 0240 02/03/20 0214 02/03/20 0916  WBC 8.8 10.5 11.6* 12.3* 13.3*  NEUTROABS  --   --  9.0* 10.3* 11.3*  HGB 13.5 13.1 13.4 13.2 14.5  HCT 44.0 42.1 42.3 40.4 44.6  MCV 94.4 92.7 91.6 86.7 88.0  PLT 129* 133* 79* 69* 75*    Basic Metabolic Panel: Recent Labs  Lab 01/31/20 0613 02/01/20 0602 02/02/20 0242 02/03/20 0214 02/04/20 0248  NA 133* 132* 131* 134* 132*  K 4.5 4.9 3.9 2.5* 4.9  CL 102 99 98 94* 91*  CO2 18* 20* 23 30 32  GLUCOSE 116* 123* 122* 111* 111*  BUN 47* 57* 56* 35* 24*  CREATININE 0.96 1.36* 1.02* 0.68 0.69  CALCIUM 8.5* 8.4* 7.7* 7.9* 7.9*  MG  --   --   --  1.7  --     GFR: Estimated Creatinine Clearance:  44.5 mL/min (by C-G formula based on SCr of 0.69 mg/dL).  Liver Function Tests: Recent Labs  Lab 01/31/20 0613 02/01/20 0602 02/02/20 0242 02/03/20 0214 02/04/20 0248  AST 237* 656* 392* 274* 191*  ALT 197* 358* 291* 225* 175*  ALKPHOS 113 149* 127* 106 103  BILITOT 1.9* 2.0* 1.2 1.6* 1.6*  PROT 6.3* 6.2* 5.7* 5.2* 5.7*  ALBUMIN 3.1* 3.0* 2.6* 2.3* 2.6*    CBG: Recent Labs  Lab 01/30/20 1338 01/31/20 0600  GLUCAP 102* 124*     Recent Results (from the past 240 hour(s))  Urine culture     Status: Abnormal   Collection Time: 01/27/20  5:59 PM   Specimen: Urine, Clean Catch  Result Value Ref Range Status   Specimen Description   Final    URINE, CLEAN CATCH Performed at [facility], [address], [place], [place]    Special Requests   Final  NONE Performed at [facility], [address] [place], [place] [address]    Culture >=100,000 COLONIES/mL KLEBSIELLA PNEUMONIAE (A)  Final   Report Status 01/30/2020 FINAL  Final   Organism ID, Bacteria KLEBSIELLA PNEUMONIAE (A)  Final      Susceptibility   Klebsiella pneumoniae - MIC*    AMPICILLIN >=32 RESISTANT Resistant     CEFAZOLIN <=4 SENSITIVE Sensitive     CEFTRIAXONE <=1 SENSITIVE Sensitive     CIPROFLOXACIN <=0.25 SENSITIVE Sensitive     GENTAMICIN <=1 SENSITIVE Sensitive     IMIPENEM <=0.25 SENSITIVE Sensitive     NITROFURANTOIN 64 INTERMEDIATE Intermediate     TRIMETH/SULFA <=20 SENSITIVE Sensitive     AMPICILLIN/SULBACTAM 8 SENSITIVE Sensitive     PIP/TAZO <=4 SENSITIVE Sensitive     * >=100,000 COLONIES/mL KLEBSIELLA PNEUMONIAE  Blood culture (routine x 2)     Status: None   Collection Time: 01/30/20  1:36 PM   Specimen: BLOOD  Result Value Ref Range Status   Specimen Description   Final    BLOOD LEFT ANTECUBITAL Performed at [facility], [address], [place], [place] [address]    Special Requests   Final    BOTTLES DRAWN  AEROBIC AND ANAEROBIC Blood Culture adequate volume Performed at [facility], [address] [place], [place] [address]    Culture   Final    NO GROWTH 5 DAYS Performed at [facility], [address] [place], [place] [address]    Report Status 02/04/2020 FINAL  Final  SARS Coronavirus 2 by RT PCR (hospital order, performed in [facility] hospital lab) Nasopharyngeal Nasopharyngeal Swab     Status: None   Collection Time: 01/30/20  3:06 PM   Specimen: Nasopharyngeal Swab  Result Value Ref Range Status   SARS Coronavirus 2 NEGATIVE NEGATIVE Final    Comment: (NOTE) SARS-CoV-2 target nucleic acids are NOT DETECTED. The SARS-CoV-2 RNA is generally detectable in upper and lower respiratory specimens during the acute phase of infection. The lowest concentration of SARS-CoV-2 viral copies this assay can detect is 250 copies / mL. A negative result does not preclude SARS-CoV-2 infection and should not be used as the sole basis for treatment or other patient management decisions.  A negative result may occur with improper specimen collection / handling, submission of specimen other than nasopharyngeal swab, presence of viral mutation(s) within the areas targeted by this assay, and inadequate number of viral copies (<250 copies / mL). A negative result must be combined with clinical observations, patient history, and epidemiological information. Fact Sheet for Patients:   BoilerBrush.com.cy Fact Sheet for Healthcare Providers: https://pope.com/ This test is not yet approved or cleared  by the [place] FDA and has been authorized for detection and/or diagnosis of SARS-CoV-2 by FDA under an Emergency Use Authorization (EUA).  This EUA will remain in effect (meaning this test can be used) for the duration of the COVID-19 declaration under Section 564(b)(1) of the Act, 21 U.S.C. section 360bbb-3(b)(1), unless the  authorization is terminated or revoked sooner. Performed at [facility], [address], [place], [place] [address]   Urine culture     Status: None   Collection Time: 01/31/20  6:37 AM   Specimen: Urine, Catheterized  Result Value Ref Range Status   Specimen Description   Final    URINE, CATHETERIZED Performed at [facility], [address] [place], [place] [address]    Special Requests  Final    NONE Performed at [facility], [address] [place], [place] [address]    Culture   Final    NO GROWTH Performed at [facility], [address] [place], [place] [address]    Report Status 02/01/2020 FINAL  Final  MRSA PCR Screening     Status: None   Collection Time: 01/31/20 12:27 PM   Specimen: Nasal Mucosa; Nasopharyngeal  Result Value Ref Range Status   MRSA by PCR NEGATIVE NEGATIVE Final    Comment:        The GeneXpert MRSA Assay (FDA approved for NASAL specimens only), is one component of a comprehensive MRSA colonization surveillance program. It is not intended to diagnose MRSA infection nor to guide or monitor treatment for MRSA infections. Performed at [facility], [address], [place], [place] [address]          Radiology Studies: No results found.      Scheduled Meds: . Chlorhexidine Gluconate Cloth  6 each Topical Daily  . digoxin  0.125 mg Oral Daily  . feeding supplement (ENSURE ENLIVE)  237 mL Oral BID BM  . furosemide  40 mg Intravenous Q12H  . metoprolol tartrate  12.5 mg Oral BID  . multivitamin with minerals  1 tablet Oral Daily  . nutrition supplement (JUVEN)  1 packet Oral BID BM  . sildenafil  20 mg Oral TID   Continuous Infusions:   LOS: 4 days     Time spent: 35 mins I have personally reviewed and interpreted on  02/04/2020 daily labs, tele strips, imagings as discussed above under date review session and assessment and plans.  I  reviewed all nursing notes, pharmacy notes, consultant notes,  vitals, pertinent old records  I have discussed plan of care as described above with RN , patient and family on 02/04/2020  Voice Recognition /Dragon dictation system was used to create this note, attempts have been made to correct errors. Please contact the author with questions and/or clarifications.   Albertine Grates, MD PhD FACP [facility]  Available via Epic secure chat 7am-7pm for nonurgent issues Please page for urgent issues To page the attending provider between 7A-7P or the covering provider during after hours 7P-7A, please log into the web site [email or web address] and access using universal [place] password for that web site. If you do not have the password, please call the hospital operator.    02/04/2020, 7:42 AM

## 2020-02-04 NOTE — Progress Notes (Signed)
Subjective:  She states she is feeling better.   Intake/Output from previous day:  I/O last 3 completed shifts: In: 878.2 [P.O.:540; IV Piggyback:338.2] Out: 4500 [Urine:4500] Total I/O In: 240 [P.O.:240] Out: -   Blood pressure (!) 96/47, pulse 81, temperature 98.1 F (36.7 C), temperature source Oral, resp. rate 12, height '5\' 5"'  (1.651 m), weight 54.8 kg, SpO2 90 %. Physical Exam  Cardiovascular: Normal rate, regular rhythm and intact distal pulses. Exam reveals no gallop.  Murmur heard. High-pitched blowing holosystolic murmur is present with a grade of 2/6 at the apex. JVD 1/2 way up the neck.  No leg edema.  Pulmonary/Chest: Effort normal and breath sounds normal.  Abdominal: Soft. Bowel sounds are normal.   Lab Results: BMP BNP (last 3 results) Recent Labs    02/02/20 0632 02/03/20 0214 02/04/20 0248  BNP 2,262.4* 1,815.1* 1,351.8*    ProBNP (last 3 results) No results for input(s): PROBNP in the last 8760 hours. BMP Latest Ref Rng & Units 02/04/2020 02/03/2020 02/02/2020  Glucose 70 - 99 mg/dL 111(H) 111(H) 122(H)  BUN 8 - 23 mg/dL 24(H) 35(H) 56(H)  Creatinine 0.44 - 1.00 mg/dL 0.69 0.68 1.02(H)  Sodium 135 - 145 mmol/L 132(L) 134(L) 131(L)  Potassium 3.5 - 5.1 mmol/L 4.9 2.5(LL) 3.9  Chloride 98 - 111 mmol/L 91(L) 94(L) 98  CO2 22 - 32 mmol/L 32 30 23  Calcium 8.9 - 10.3 mg/dL 7.9(L) 7.9(L) 7.7(L)   Hepatic Function Latest Ref Rng & Units 02/04/2020 02/03/2020 02/02/2020  Total Protein 6.5 - 8.1 g/dL 5.7(L) 5.2(L) 5.7(L)  Albumin 3.5 - 5.0 g/dL 2.6(L) 2.3(L) 2.6(L)  AST 15 - 41 U/L 191(H) 274(H) 392(H)  ALT 0 - 44 U/L 175(H) 225(H) 291(H)  Alk Phosphatase 38 - 126 U/L 103 106 127(H)  Total Bilirubin 0.3 - 1.2 mg/dL 1.6(H) 1.6(H) 1.2  Bilirubin, Direct 0.0 - 0.2 mg/dL - - -   CBC Latest Ref Rng & Units 02/03/2020 02/03/2020 02/01/2020  WBC 4.0 - 10.5 K/uL 13.3(H) 12.3(H) 11.6(H)  Hemoglobin 12.0 - 15.0 g/dL 14.5 13.2 13.4  Hematocrit 36.0 - 46.0 % 44.6 40.4  42.3  Platelets 150 - 400 K/uL 75(L) 69(L) 79(L)   Lipid Panel  No results found for: CHOL, TRIG, HDL, CHOLHDL, VLDL, LDLCALC, LDLDIRECT Cardiac Panel (last 3 results) No results for input(s): CKTOTAL, CKMB, TROPONINI, RELINDX in the last 72 hours.  HEMOGLOBIN A1C No results found for: HGBA1C, MPG TSH Recent Labs    01/30/20 1338  TSH 1.554    Radiology:   Imaging:  Imaging Results (Last 48 hours)  CT Head Wo Contrast  Result Date: 01/30/2020 CLINICAL DATA:  Generalized weakness. EXAM: CT HEAD WITHOUT CONTRAST TECHNIQUE: Contiguous axial images were obtained from the base of the skull through the vertex without intravenous contrast. COMPARISON:  Jan 27, 2020 FINDINGS: Brain: There is mild cerebral atrophy with widening of the extra-axial spaces and ventricular dilatation. There are areas of decreased attenuation within the white matter tracts of the supratentorial brain, consistent with microvascular disease changes. Vascular: No hyperdense vessel or unexpected calcification. Skull: Normal. Negative for fracture or focal lesion. Sinuses/Orbits: No acute finding. Other: None. IMPRESSION: 1. Generalized cerebral atrophy. 2. No acute intracranial abnormality. Electronically Signed   By: Virgina Norfolk M.D.   On: 01/30/2020 15:37   US RENAL  Result Date: 01/31/2020 CLINICAL DATA:  Acute kidney injury.  History of bladder suspension EXAM: RENAL / URINARY TRACT ULTRASOUND COMPLETE COMPARISON:  None. FINDINGS: Right Kidney: Renal measurements: 8.7 x 3.5  x 5.1 cm = volume: 81 mL . Echogenicity within normal limits. No mass or hydronephrosis visualized. Left Kidney: Renal measurements: 9.3 x 4.2 x 4.0 cm = volume: 81.1 mL. Echogenicity within normal limits. No mass. Pelvocaliectasis noted. Bladder: Contains of Foley catheter.  Otherwise unremarkable. Other: Bilateral pleural effusions noted.  Gallbladder sludge present. IMPRESSION: 1. Small bilateral kidneys. No evidence for high-grade  obstructive uropathy. 2. Left pelvocaliectasis. Electronically Signed   By: Kerby Moors M.D.   On: 01/31/2020 12:00   DG CHEST PORT 1 VIEW  Result Date: 01/31/2020 CLINICAL DATA:  Follow-up pneumonia EXAM: PORTABLE CHEST 1 VIEW COMPARISON:  01/30/2019 FINDINGS: Cardiomegaly, vascular congestion. Improving bilateral airspace disease. Continued bilateral lower lobe consolidation, left greater than right. No visible significant effusions or pneumothorax. IMPRESSION: Improving bilateral airspace disease with residual bibasilar opacities. Electronically Signed   By: Rolm Baptise M.D.   On: 01/31/2020 13:14   DG Chest Portable 1 View  Result Date: 01/30/2020 CLINICAL DATA:  Shortness of breath EXAM: PORTABLE CHEST 1 VIEW COMPARISON:  01/27/2020 FINDINGS: Stable cardiomegaly. Atherosclerotic calcification of the aortic knob. Small bilateral pleural effusions with associated bibasilar opacities. Bilateral interstitial prominence suggesting mild edema. No pneumothorax is seen. IMPRESSION: Findings suggesting CHF with mild edema and bilateral pleural effusions. Electronically Signed   By: Davina Poke D.O.   On: 01/30/2020 14:23   US Abdomen Limited RUQ  Result Date: 01/30/2020 CLINICAL DATA:  Abnormal LFTs EXAM: ULTRASOUND ABDOMEN LIMITED RIGHT UPPER QUADRANT COMPARISON:  None. FINDINGS: Gallbladder: Gallbladder is well distended with gallbladder sludge within. No definitive cholelithiasis is seen. No wall thickening or pericholecystic fluid is noted. Common bile duct: Diameter: 8 mm which is within normal limits for the patient's given age. Liver: No focal lesion identified. Within normal limits in parenchymal echogenicity. Portal vein is patent on color Doppler imaging with normal direction of blood flow towards the liver. Other: Small right pleural effusion is noted. IMPRESSION: Gallbladder sludge without cholelithiasis. Small right pleural effusion. Electronically Signed   By: Inez Catalina M.D.    On: 01/30/2020 18:47    CT angiogram chest 01/27/2020: Cardiovascular: This is a technically adequate evaluation of the pulmonary vasculature. There are no filling defects or pulmonary emboli.  Heart is mildly enlarged, with prominent biatrial dilatation, right greater than left. Reflux of contrast into the hepatic veins suggest a degree of cardiac dysfunction.  Thoracic aorta demonstrates normal caliber, with extensive atherosclerosis.  Mediastinum/Nodes: No enlarged mediastinal, hilar, or axillary lymph nodes. Thyroid gland, trachea, and esophagus demonstrate no significant findings.  Cardiac Studies:   Echocardiogram 01/31/2020:    1. Left ventricular ejection fraction, by estimation, is <20%. Left ventricular ejection fraction by PLAX is 23 %. The left ventricle has severely decreased function. The left ventricle demonstrates global hypokinesis. Left ventricular diastolic function could not be evaluated.  2. Right ventricular systolic function is severely reduced. The right ventricular size is moderately enlarged. There is mildly elevated pulmonary artery systolic pressure. The estimated right ventricular systolic pressure is 00.9 mmHg.  3. Left atrial size was severely dilated.  4. Right atrial size was severely dilated.  5. Moderate pleural effusion in both left and right lateral regions.  6. The mitral valve is grossly normal. Severe mitral valve regurgitation.  7. Tricuspid valve regurgitation is moderate to severe.  8. The aortic valve is normal in structure. Aortic valve regurgitation is not visualized. No aortic stenosis is present.  EKG 01/21/2020: Sinus tachycardia at rate of 113 bpm, Right atrial enlargement, normal  axis, poor R wave progression, cannot exclude anteroseptal infarct old.  Borderline criteria for LVH.  Nonspecific T abnormality.  Telemetry: Independently reviewed, 01/31/2020: Sinus tachycardia from 111 beats 130 bpm, rare PACs.  Rare atrial  couplets.  Medications:   Scheduled Meds: . bisacodyl  10 mg Rectal Daily  . Chlorhexidine Gluconate Cloth  6 each Topical Daily  . digoxin  0.125 mg Oral Daily  . feeding supplement (ENSURE ENLIVE)  237 mL Oral BID BM  . ivabradine  5 mg Oral BID WC  . metoprolol tartrate  12.5 mg Oral BID  . multivitamin with minerals  1 tablet Oral Daily  . nutrition supplement (JUVEN)  1 packet Oral BID BM  . sacubitril-valsartan  1 tablet Oral BID  . senna-docusate  1 tablet Oral BID  . sildenafil  20 mg Oral TID  . torsemide  20 mg Oral Daily   Continuous Infusions:  PRN Meds:.albuterol, LORazepam, ondansetron (ZOFRAN) IV, oxyCODONE-acetaminophen, polyethylene glycol   Assessment   1.  Acute decompensated heart failure, acute systolic biventricular heart failure 2.  CT findings suggestive of pulmonary hypertension with right atrial enlargement and hepatic reflux of contrast. 3.  Sinus tachycardia/brief atrial tachycardia and occasional PVCs 4.  Moderate bilateral pleural effusions 5.  Abnormal LFTs related to hepatic congestion improving 6.  Acute renal failure stage 3 b now resolved. 7.  Mitral valvular heart disease  Recommendations:   Change to PO Torsemide 20 mg daily. Continue digoxin and Revatio. Will challenge with entresto low dose today and if S. Cr is normal tomorrow and BP > 80 mm Hg, and good UOP, continue that dose along with low dose Metoprolol. PT/OT and up  In chair for meals. All numbers including BNP, LFT and renal function trending towards improvement. Add Corlanor in view of S. Tachycardia. Daughter present and all questions answered. Patient ambulated yesterday.  Adrian Prows, MD, [facility] 02/04/2020, 10:06 AM [facility]. Central Office: [phone]

## 2020-02-04 NOTE — Progress Notes (Signed)
Patient's BP 62/28 (37), cardiologist Dr. Jacinto Halim called, verbal order for a 250cc bolus and to discontinue Entresto. 250cc bolus started, medication discontinued. Will continue to monitor.

## 2020-02-04 NOTE — Progress Notes (Signed)
Physical Therapy Treatment Patient Details Name: Anita Martinez MRN: [id number] DOB: 08-09-1933 Today's Date: 02/04/2020    History of Present Illness 86yo female admitted 01/31/20 with generalized weakness, unable to stand/walk, dyspnea on exertion. PMH: HOH, chronic back pain, opiod dependence, anxiety, depression, HLD. HeadCT = no acute abnormality, generalized cerebral atrophy. CXR = CHF, mild edema, B pleural effusions , issues with low BP    PT Comments    Pt making good progress with therapy.  Pt had soft BP but stable with transfers and no c/o dizziness of fatigue with transfers.  Her O2 sats were stable on RA with activity.  Does have mild decreased safety and balance with mobility - continue to recommend HHPT and 24 hr supervision.    O2 sats on RA rest 95% O2 sats on RA walking 92%     Follow Up Recommendations  Home health PT;Supervision/Assistance - 24 hour     Equipment Recommendations  Rolling walker with 5" wheels    Recommendations for Other Services       Precautions / Restrictions Precautions Precautions: Fall    Mobility  Bed Mobility Overal bed mobility: Needs Assistance Bed Mobility: Sit to Supine       Sit to supine: Supervision      Transfers Overall transfer level: Needs assistance Equipment used: Rolling walker (2 wheeled) Transfers: Sit to/from Stand Sit to Stand: Supervision         General transfer comment: performed x 3; cues for safe hand placement  Ambulation/Gait Ambulation/Gait assistance: Min guard Gait Distance (Feet): 120 Feet Assistive device: Rolling walker (2 wheeled) Gait Pattern/deviations: Step-through pattern;Decreased stride length;Narrow base of support;Trunk flexed Gait velocity: decr   General Gait Details: cued for posture and increased BOS and RW proximity   Chief Strategy Officer    Modified Rankin (Stroke Patients Only)       Balance Overall balance assessment: Needs  assistance Sitting-balance support: No upper extremity supported;Feet supported Sitting balance-Leahy Scale: Good     Standing balance support: During functional activity;No upper extremity supported Standing balance-Leahy Scale: Fair Standing balance comment: required close guarding for safety                            Cognition Arousal/Alertness: Awake/alert Behavior During Therapy: WFL for tasks assessed/performed Overall Cognitive Status: Within Functional Limits for tasks assessed                                 General Comments: HOH; daughter present      Exercises      General Comments General comments (skin integrity, edema, etc.): Pt's blood pressure was soft but stable throughout transfers (low 100's / 40's).  Pt was on RA with sats 95% rest, down to 92% with ambulation on RA.      Pertinent Vitals/Pain Pain Assessment: No/denies pain    Home Living                      Prior Function            PT Goals (current goals can now be found in the care plan section) Progress towards PT goals: Progressing toward goals    Frequency    Min 3X/week      PT Plan Current plan remains appropriate  Co-evaluation              AM-PAC PT "6 Clicks" Mobility   Outcome Measure  Help needed turning from your back to your side while in a flat bed without using bedrails?: None Help needed moving from lying on your back to sitting on the side of a flat bed without using bedrails?: A Little Help needed moving to and from a bed to a chair (including a wheelchair)?: None Help needed standing up from a chair using your arms (e.g., wheelchair or bedside chair)?: None Help needed to walk in hospital room?: None Help needed climbing 3-5 steps with a railing? : A Little 6 Click Score: 22    End of Session Equipment Utilized During Treatment: Gait belt Activity Tolerance: Patient tolerated treatment well Patient left: in bed;with  family/visitor present;with call bell/phone within reach;with bed alarm set Nurse Communication: Mobility status(O2 results) PT Visit Diagnosis: Unsteadiness on feet (R26.81);Muscle weakness (generalized) (M62.81)     Time: 1165-7903 PT Time Calculation (min) (ACUTE ONLY): 25 min  Charges:  $Gait Training: 8-22 mins $Therapeutic Activity: 8-22 mins                     Royetta Asal, PT Acute Rehab Services Pager [phone] [facility] [phone] [facility] [phone]    Rayetta Humphrey 02/04/2020, 1:49 PM

## 2020-02-05 LAB — CBC WITH DIFFERENTIAL/PLATELET
Abs Immature Granulocytes: 0.05 10*3/uL (ref 0.00–0.07)
Basophils Absolute: 0 10*3/uL (ref 0.0–0.1)
Basophils Relative: 0 %
Eosinophils Absolute: 0.1 10*3/uL (ref 0.0–0.5)
Eosinophils Relative: 1 %
HCT: 42.2 % (ref 36.0–46.0)
Hemoglobin: 13.5 g/dL (ref 12.0–15.0)
Immature Granulocytes: 0 %
Lymphocytes Relative: 12 %
Lymphs Abs: 1.8 10*3/uL (ref 0.7–4.0)
MCH: 27.9 pg (ref 26.0–34.0)
MCHC: 32 g/dL (ref 30.0–36.0)
MCV: 87.2 fL (ref 80.0–100.0)
Monocytes Absolute: 1.2 10*3/uL — ABNORMAL HIGH (ref 0.1–1.0)
Monocytes Relative: 8 %
Neutro Abs: 11.3 10*3/uL — ABNORMAL HIGH (ref 1.7–7.7)
Neutrophils Relative %: 79 %
Platelets: 90 10*3/uL — ABNORMAL LOW (ref 150–400)
RBC: 4.84 MIL/uL (ref 3.87–5.11)
RDW: 15.4 % (ref 11.5–15.5)
WBC: 14.4 10*3/uL — ABNORMAL HIGH (ref 4.0–10.5)
nRBC: 0 % (ref 0.0–0.2)

## 2020-02-05 LAB — COMPREHENSIVE METABOLIC PANEL
ALT: 113 U/L — ABNORMAL HIGH (ref 0–44)
AST: 112 U/L — ABNORMAL HIGH (ref 15–41)
Albumin: 2.3 g/dL — ABNORMAL LOW (ref 3.5–5.0)
Alkaline Phosphatase: 91 U/L (ref 38–126)
Anion gap: 9 (ref 5–15)
BUN: 22 mg/dL (ref 8–23)
CO2: 31 mmol/L (ref 22–32)
Calcium: 7.7 mg/dL — ABNORMAL LOW (ref 8.9–10.3)
Chloride: 92 mmol/L — ABNORMAL LOW (ref 98–111)
Creatinine, Ser: 0.64 mg/dL (ref 0.44–1.00)
GFR calc Af Amer: >60 mL/min (ref >60)
GFR calc non Af Amer: >60 mL/min (ref >60)
Glucose, Bld: 106 mg/dL — ABNORMAL HIGH (ref 70–99)
Potassium: 3.8 mmol/L (ref 3.5–5.1)
Sodium: 132 mmol/L — ABNORMAL LOW (ref 135–145)
Total Bilirubin: 1.7 mg/dL — ABNORMAL HIGH (ref 0.3–1.2)
Total Protein: 5.4 g/dL — ABNORMAL LOW (ref 6.5–8.1)

## 2020-02-05 LAB — MAGNESIUM: Magnesium: 2.1 mg/dL (ref 1.7–2.4)

## 2020-02-05 LAB — BRAIN NATRIURETIC PEPTIDE: B Natriuretic Peptide: 1570.1 pg/mL — ABNORMAL HIGH (ref 0.0–100.0)

## 2020-02-05 LAB — PROCALCITONIN: Procalcitonin: 0.12 ng/mL

## 2020-02-05 MED ORDER — SODIUM CHLORIDE 0.9 % IV BOLUS
250.0000 mL | Freq: Once | INTRAVENOUS | Status: AC
  Administered 2020-02-05: 250 mL via INTRAVENOUS

## 2020-02-05 NOTE — Progress Notes (Addendum)
PROGRESS NOTE    Anita Martinez  ERD:[id number] DOB: May 19, 1933 DOA: 01/30/2020 PCP: Leeroy Cha   Chief Complaint  Patient presents with  . Weakness  . 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 Antionette Char group consulted, there is some confusion about CODE STATUS (please refer note from Dr. Benny Lennert on May 12), will need to continue conversation with patient and daughter   Subjective:  Patient's BP dropped to 62/28 yesterday evening, entrasto discontinued, 1.7 liter urine output last 24hrs She denies pain, no sob, no cough,  no edema, no fever She reports overall  feeling better Daughter at bedside    Assessment & Plan:   Principal Problem:   Acute CHF (congestive heart failure) ([place]) Active Problems:   Pressure injury of skin   Acute heart failure ([facility])   Abnormal LFTs   AKI (acute kidney injury) ([place])  Severe biventicular heart failure with pleural effusion, liver congestion and acute renal failure, no prior history of known heart disease Appear slowly improving on current regiment, management per cardiology   AKI on CKD 2  -renal ultrasound demonstrates no obstructive uropathy --AKI likely due to cardiorenal syndrome, urine culture obtained from May 7 with Klebsiella pneumonia, UA on presentation on May 11 no bacteria seen, blood culture negative -She received total of 3 dose of Rocephin since admission,  antibiotics d/ced -BUN/creatinine peaked at 57/1.36, now normalized -monitor, renal dosing meds  Leukocytosis:  There is a question of aspiration  pneumonia initially CTA:   1. No evidence of pulmonary embolus. 2. Findings consistent with congestive heart failure, with moderate bilateral pleural effusions and scattered ground-glass airspace Disease" On dysphagia 3 diet, thin liquid per swallow eval, continue aspiration precaution Wbc peaked at 15.5, appears start to trend down, lactic acid within normal limit  no fever, no cough,  Monitor procalcitonin, will check urine strep pneumo antigen Monitor off antibiotic for now   Abnormal liver function Likely due to liver congestion from heart failure Acute hepatitis panel negative lft trending down  Hypervolemic hyponatremia, -Likely from acute heart failure, currently on Lasix,  -sodium nadir at 131, slowly improving  Hypokalemia/hypomagnesemia -Replaced,  monitor  Thrombocytopenia acute, from liver congestion versus antibiotics? -Unclear etiology, no exposure to heparin Total bilirubin  normal to mildly elevated, less likely hemolysis -No sign of bleeding - platelet appeared nadired at 69 , starts to improve  - repeat CBC in the morning  QTC prolongation QTC 515 on presentation Home med amitriptyline discontinued QTC 471 on repeat Continue telemetry, keep potassium above 4, magnesium above 2  Stage II sacral pressure ulcer: Wound care is consulted  Constipation : start bowel regimen. Increase activity   H/o colon resection with chronic abdominal wall hernia  FTT; previously independent, driving, currently very weak, PT eval   DVT prophylaxis: SCDs  Code Status: DNR, confirmed with patient /duahgter in room with the presence of RN Family Communication: Daughter at bedside daily Disposition:   Status is: Inpatient  Dispo: The patient is from: Home              Anticipated d/c is to:     TBD  Anticipated d/c date is: 2-3 days              Patient condition remains critical, continue in stepdown unit today      Consultants:   Cardiology  Procedures:  None  Antimicrobials: Rocephin from admission to May 13    Objective: Vitals:   02/05/20 0400 02/05/20 0500 02/05/20 0600 02/05/20 0700  BP: (!) 88/35 (!) 97/45 (!) 98/50 (!) 95/53  Pulse: 65 67 (!) 44 70  Resp: 12 12 13 14   Temp: 98.1 F (36.7 C)     TempSrc: Oral     SpO2: 95% 97% 97% 96%  Weight:      Height:        Intake/Output Summary (Last 24 hours) at 02/05/2020 0746 Last data filed at 02/05/2020 0200 Gross per 24 hour  Intake 240 ml  Output 1700 ml  Net -1460 ml   Filed Weights   02/02/20 0500 02/03/20 0500 02/04/20 0500  Weight: 55.3 kg 54.2 kg 54.8 kg    Examination:  General exam: frail, calm , NAD, hard of hearing Respiratory system: diminished at basis, some crackles, no wheezing, no rhonchi. Cardiovascular system: S1 & S2 heard, RRR. No JVD, no murmurs. No pedal edema. Gastrointestinal system: abdominal wall hernia, reducible, nontender,  nondistended,t. Normal bowel sounds heard. Central nervous system: Alert and oriented. No focal neurological deficits. Extremities: Symmetric, generalized weakness Skin: No rashes, lesions or ulcers Psychiatry: Judgement and insight appear normal. Mood & affect appropriate.     Data Reviewed: I have personally reviewed following labs and imaging studies  CBC: Recent Labs  Lab 02/01/20 0240 02/03/20 0214 02/03/20 0916 02/04/20 0822 02/05/20 0318  WBC 11.6* 12.3* 13.3* 15.5* 14.4*  NEUTROABS 9.0* 10.3* 11.3* 12.4* 11.3*  HGB 13.4 13.2 14.5 14.0 13.5  HCT 42.3 40.4 44.6 43.0 42.2  MCV 91.6 86.7 88.0 85.8 87.2  PLT 79* 69* 75* 84* 90*    Basic Metabolic Panel: Recent Labs  Lab 02/01/20 0602 02/02/20 0242 02/03/20 0214 02/04/20 0248 02/05/20 0318  NA 132* 131* 134* 132* 132*  K 4.9 3.9 2.5* 4.9 3.8  CL 99 98 94* 91* 92*  CO2 20* 23 30 32 31  GLUCOSE 123* 122* 111* 111* 106*  BUN 57* 56* 35* 24* 22  CREATININE 1.36* 1.02* 0.68 0.69 0.64  CALCIUM 8.4* 7.7* 7.9* 7.9* 7.7*  MG  --   --  1.7  --  2.1      GFR: Estimated Creatinine Clearance: 44.5 mL/min (by C-G formula based on SCr of 0.64 mg/dL).  Liver Function Tests: Recent Labs  Lab 02/01/20 0602 02/02/20 0242 02/03/20 0214 02/04/20 0248 02/05/20 0318  AST 656* 392* 274* 191* 112*  ALT 358* 291* 225* 175* 113*  ALKPHOS 149* 127* 106 103 91  BILITOT 2.0* 1.2 1.6* 1.6* 1.7*  PROT 6.2* 5.7* 5.2* 5.7* 5.4*  ALBUMIN 3.0* 2.6* 2.3* 2.6* 2.3*    CBG: Recent Labs  Lab 01/30/20 1338 01/31/20 0600  GLUCAP 102* 124*     Recent Results (from the past 240 hour(s))  Urine culture     Status: Abnormal   Collection Time: 01/27/20  5:59 PM   Specimen: Urine, Clean Catch  Result Value Ref Range Status   Specimen Description   Final    URINE, CLEAN CATCH Performed at [facility], [address] [place], [place]    Special Requests   Final    NONE Performed at [facility], [address] [place], [place] [address]    Culture >=100,000 COLONIES/mL KLEBSIELLA PNEUMONIAE (A)  Final   Report Status 01/30/2020 FINAL  Final   Organism ID, Bacteria KLEBSIELLA PNEUMONIAE (A)  Final      Susceptibility   Klebsiella pneumoniae - MIC*    AMPICILLIN >=32 RESISTANT Resistant     CEFAZOLIN <=4 SENSITIVE Sensitive     CEFTRIAXONE <=1 SENSITIVE Sensitive     CIPROFLOXACIN <=0.25 SENSITIVE Sensitive     GENTAMICIN <=1 SENSITIVE Sensitive     IMIPENEM <=0.25 SENSITIVE Sensitive     NITROFURANTOIN 64 INTERMEDIATE Intermediate     TRIMETH/SULFA <=20 SENSITIVE Sensitive     AMPICILLIN/SULBACTAM 8 SENSITIVE Sensitive     PIP/TAZO <=4 SENSITIVE Sensitive     * >=100,000 COLONIES/mL KLEBSIELLA PNEUMONIAE  Blood culture (routine x 2)     Status: None   Collection Time: 01/30/20  1:36 PM   Specimen: BLOOD  Result Value Ref Range Status   Specimen Description   Final    BLOOD LEFT ANTECUBITAL Performed at [facility], [address], [place], [place] [address]     Special Requests   Final    BOTTLES DRAWN AEROBIC AND ANAEROBIC Blood Culture adequate volume Performed at [facility], [address], [place], [place] [address]    Culture   Final    NO GROWTH 5 DAYS Performed at [facility], [address], [place], [place] [address]    Report Status 02/04/2020 FINAL  Final  SARS Coronavirus 2 by RT PCR (hospital order, performed in [facility] hospital lab) Nasopharyngeal Nasopharyngeal Swab     Status: None   Collection Time: 01/30/20  3:06 PM   Specimen: Nasopharyngeal Swab  Result Value Ref Range Status   SARS Coronavirus 2 NEGATIVE NEGATIVE Final    Comment: (NOTE) SARS-CoV-2 target nucleic acids are NOT DETECTED. The SARS-CoV-2 RNA is generally detectable in upper and lower respiratory specimens during the acute phase of infection. The lowest concentration of SARS-CoV-2 viral copies this assay can detect is 250 copies / mL. A negative result does not preclude SARS-CoV-2 infection and should not be used as the sole basis for treatment or other patient management decisions.  A negative result may occur with improper specimen collection / handling, submission of specimen other than nasopharyngeal swab, presence of viral mutation(s) within the areas targeted by this assay, and inadequate number of viral copies (<250 copies / mL). A negative result must be combined with clinical observations, patient history, and epidemiological information. Fact Sheet for Patients:   BoilerBrush.com.cy Fact Sheet for Healthcare Providers: https://pope.com/ This test is not yet approved or cleared  by the [place] FDA and has been authorized for detection and/or diagnosis of SARS-CoV-2 by FDA under an Emergency Use Authorization (EUA).  This EUA will remain in effect (meaning this test can be used) for the duration of the COVID-19 declaration under Section 564(b)(1) of the Act, 21  U.S.C. section 360bbb-3(b)(1), unless the authorization is terminated or revoked sooner. Performed at [facility], [address] [place], [place] [address]   Urine culture     Status: None   Collection Time: 01/31/20  6:37 AM   Specimen: Urine, Catheterized  Result Value Ref Range Status   Specimen Description   Final    URINE, CATHETERIZED Performed at [facility], [address] [place], [place] [address]    Special Requests   Final    NONE Performed at [facility], [address] [place], [place] [address]    Culture   Final    NO GROWTH Performed at [facility], [address] [place], [place] [address]    Report Status 02/01/2020 FINAL  Final  MRSA PCR Screening     Status: None   Collection Time: 01/31/20 12:27 PM   Specimen: Nasal Mucosa; Nasopharyngeal  Result Value Ref Range Status   MRSA by PCR NEGATIVE NEGATIVE Final    Comment:        The GeneXpert MRSA Assay (FDA approved for NASAL specimens only), is one component of a comprehensive MRSA colonization surveillance program. It is not intended to diagnose MRSA infection nor to guide or monitor treatment for MRSA infections. Performed at [facility], [address], [place], [place] [address]          Radiology Studies: No results found.      Scheduled Meds: . bisacodyl  10 mg Rectal Daily  . Chlorhexidine Gluconate Cloth  6 each Topical Daily  . digoxin  0.125 mg Oral Daily  . feeding supplement (ENSURE ENLIVE)  237 mL Oral BID BM  . ivabradine  5 mg Oral BID WC  . metoprolol tartrate  12.5 mg Oral BID  . multivitamin with minerals  1 tablet Oral Daily  . nutrition supplement (JUVEN)  1 packet Oral BID BM  . senna-docusate  1 tablet Oral BID  . sildenafil  20 mg Oral TID  . torsemide  20 mg Oral Daily   Continuous Infusions:   LOS: 5 days     Time spent: 35 mins I have personally reviewed and  interpreted on  02/05/2020 daily labs, tele strips, imagings as discussed above under date review session and assessment and plans.  I reviewed all nursing notes, pharmacy notes, consultant notes,  vitals, pertinent old records  I have discussed plan of care as described above with RN , patient and family on 02/05/2020  Voice Recognition /Dragon dictation system was used to create this note, attempts have been made to correct errors. Please contact the author with questions and/or clarifications.   Albertine Grates, MD PhD FACP [facility]  Available via Epic secure chat 7am-7pm for nonurgent issues Please page for urgent issues To page the attending provider between 7A-7P or the covering provider during after hours 7P-7A, please log into the web site [email or web address] and access using universal  password for that web site. If you do not have the password, please call the hospital operator.    02/05/2020, 7:46 AM

## 2020-02-05 NOTE — Progress Notes (Signed)
1000: RN discussed am medications and soft BP with Dr. Jacinto Halim. MD ordered to give medications as ordered, but hold metoprolol.  1200: BP dropped to SBP 60-70s. Dr. Jacinto Halim notified. Verbal ordered received for NS bolus and to d/c Revatio. Will continue to monitor.

## 2020-02-06 DIAGNOSIS — L89152 Pressure ulcer of sacral region, stage 2: Secondary | ICD-10-CM

## 2020-02-06 DIAGNOSIS — R55 Syncope and collapse: Secondary | ICD-10-CM

## 2020-02-06 DIAGNOSIS — R945 Abnormal results of liver function studies: Secondary | ICD-10-CM

## 2020-02-06 LAB — COMPREHENSIVE METABOLIC PANEL
ALT: 89 U/L — ABNORMAL HIGH (ref 0–44)
AST: 84 U/L — ABNORMAL HIGH (ref 15–41)
Albumin: 2.4 g/dL — ABNORMAL LOW (ref 3.5–5.0)
Alkaline Phosphatase: 94 U/L (ref 38–126)
Anion gap: 10 (ref 5–15)
BUN: 21 mg/dL (ref 8–23)
CO2: 31 mmol/L (ref 22–32)
Calcium: 8 mg/dL — ABNORMAL LOW (ref 8.9–10.3)
Chloride: 91 mmol/L — ABNORMAL LOW (ref 98–111)
Creatinine, Ser: 0.64 mg/dL (ref 0.44–1.00)
GFR calc Af Amer: >60 mL/min (ref >60)
GFR calc non Af Amer: >60 mL/min (ref >60)
Glucose, Bld: 111 mg/dL — ABNORMAL HIGH (ref 70–99)
Potassium: 3.6 mmol/L (ref 3.5–5.1)
Sodium: 132 mmol/L — ABNORMAL LOW (ref 135–145)
Total Bilirubin: 1.8 mg/dL — ABNORMAL HIGH (ref 0.3–1.2)
Total Protein: 5.6 g/dL — ABNORMAL LOW (ref 6.5–8.1)

## 2020-02-06 LAB — CBC WITH DIFFERENTIAL/PLATELET
Abs Immature Granulocytes: 0.04 10*3/uL (ref 0.00–0.07)
Basophils Absolute: 0 10*3/uL (ref 0.0–0.1)
Basophils Relative: 0 %
Eosinophils Absolute: 0.1 10*3/uL (ref 0.0–0.5)
Eosinophils Relative: 1 %
HCT: 42 % (ref 36.0–46.0)
Hemoglobin: 13.6 g/dL (ref 12.0–15.0)
Immature Granulocytes: 0 %
Lymphocytes Relative: 14 %
Lymphs Abs: 1.8 10*3/uL (ref 0.7–4.0)
MCH: 28.4 pg (ref 26.0–34.0)
MCHC: 32.4 g/dL (ref 30.0–36.0)
MCV: 87.7 fL (ref 80.0–100.0)
Monocytes Absolute: 1 10*3/uL (ref 0.1–1.0)
Monocytes Relative: 7 %
Neutro Abs: 10.4 10*3/uL — ABNORMAL HIGH (ref 1.7–7.7)
Neutrophils Relative %: 78 %
Platelets: 101 10*3/uL — ABNORMAL LOW (ref 150–400)
RBC: 4.79 MIL/uL (ref 3.87–5.11)
RDW: 15.7 % — ABNORMAL HIGH (ref 11.5–15.5)
WBC: 13.4 10*3/uL — ABNORMAL HIGH (ref 4.0–10.5)
nRBC: 0 % (ref 0.0–0.2)

## 2020-02-06 LAB — PROCALCITONIN: Procalcitonin: <0.1 ng/mL

## 2020-02-06 LAB — LACTIC ACID, PLASMA: Lactic Acid, Venous: 1 mmol/L (ref 0.5–1.9)

## 2020-02-06 MED ORDER — IVABRADINE HCL 5 MG PO TABS
7.5000 mg | ORAL_TABLET | Freq: Two times a day (BID) | ORAL | Status: DC
  Administered 2020-02-07 – 2020-02-08 (×3): 7.5 mg via ORAL
  Filled 2020-02-06 (×5): qty 2

## 2020-02-06 NOTE — Progress Notes (Signed)
Subjective:  She states she is feeling better. Hypotensive episodes are asymptomatic. Eager to get up and walk. Daughter present.    Intake/Output from previous day:  I/O last 3 completed shifts: In: 240 [P.O.:240] Out: 2225 [Urine:2225] No intake/output data recorded.  Blood pressure (!) 109/41, pulse 93, temperature 98.1 F (36.7 C), temperature source Oral, resp. rate 15, height '5\' 5"'  (1.651 m), weight 47 kg, SpO2 97 %. Physical Exam  Cardiovascular: Normal rate, regular rhythm and intact distal pulses. Exam reveals no gallop.  Murmur heard. High-pitched blowing holosystolic murmur is present with a grade of 2/6 at the apex. JVD 1/2 way up the neck.  No leg edema.  Pulmonary/Chest: Effort normal and breath sounds normal.  Abdominal: Soft. Bowel sounds are normal.   Lab Results: BMP BNP (last 3 results) Recent Labs    02/03/20 0214 02/04/20 0248 02/05/20 0318  BNP 1,815.1* 1,351.8* 1,570.1*    ProBNP (last 3 results) No results for input(s): PROBNP in the last 8760 hours. BMP Latest Ref Rng & Units 02/06/2020 02/05/2020 02/04/2020  Glucose 70 - 99 mg/dL 111(H) 106(H) 111(H)  BUN 8 - 23 mg/dL 21 22 24(H)  Creatinine 0.44 - 1.00 mg/dL 0.64 0.64 0.69  Sodium 135 - 145 mmol/L 132(L) 132(L) 132(L)  Potassium 3.5 - 5.1 mmol/L 3.6 3.8 4.9  Chloride 98 - 111 mmol/L 91(L) 92(L) 91(L)  CO2 22 - 32 mmol/L 31 31 32  Calcium 8.9 - 10.3 mg/dL 8.0(L) 7.7(L) 7.9(L)   Hepatic Function Latest Ref Rng & Units 02/06/2020 02/05/2020 02/04/2020  Total Protein 6.5 - 8.1 g/dL 5.6(L) 5.4(L) 5.7(L)  Albumin 3.5 - 5.0 g/dL 2.4(L) 2.3(L) 2.6(L)  AST 15 - 41 U/L 84(H) 112(H) 191(H)  ALT 0 - 44 U/L 89(H) 113(H) 175(H)  Alk Phosphatase 38 - 126 U/L 94 91 103  Total Bilirubin 0.3 - 1.2 mg/dL 1.8(H) 1.7(H) 1.6(H)  Bilirubin, Direct 0.0 - 0.2 mg/dL - - -   CBC Latest Ref Rng & Units 02/06/2020 02/05/2020 02/04/2020  WBC 4.0 - 10.5 K/uL 13.4(H) 14.4(H) 15.5(H)  Hemoglobin 12.0 - 15.0 g/dL 13.6 13.5 14.0   Hematocrit 36.0 - 46.0 % 42.0 42.2 43.0  Platelets 150 - 400 K/uL 101(L) 90(L) 84(L)   Lipid Panel  No results found for: CHOL, TRIG, HDL, CHOLHDL, VLDL, LDLCALC, LDLDIRECT Cardiac Panel (last 3 results) No results for input(s): CKTOTAL, CKMB, TROPONINI, RELINDX in the last 72 hours.  HEMOGLOBIN A1C No results found for: HGBA1C, MPG TSH Recent Labs    01/30/20 1338  TSH 1.554    Radiology:   Imaging:  Imaging Results (Last 48 hours)  CT Head Wo Contrast  Result Date: 01/30/2020 CLINICAL DATA:  Generalized weakness. EXAM: CT HEAD WITHOUT CONTRAST TECHNIQUE: Contiguous axial images were obtained from the base of the skull through the vertex without intravenous contrast. COMPARISON:  Jan 27, 2020 FINDINGS: Brain: There is mild cerebral atrophy with widening of the extra-axial spaces and ventricular dilatation. There are areas of decreased attenuation within the white matter tracts of the supratentorial brain, consistent with microvascular disease changes. Vascular: No hyperdense vessel or unexpected calcification. Skull: Normal. Negative for fracture or focal lesion. Sinuses/Orbits: No acute finding. Other: None. IMPRESSION: 1. Generalized cerebral atrophy. 2. No acute intracranial abnormality. Electronically Signed   By: Virgina Norfolk M.D.   On: 01/30/2020 15:37   US RENAL  Result Date: 01/31/2020 CLINICAL DATA:  Acute kidney injury.  History of bladder suspension EXAM: RENAL / URINARY TRACT ULTRASOUND COMPLETE COMPARISON:  None. FINDINGS:  Right Kidney: Renal measurements: 8.7 x 3.5 x 5.1 cm = volume: 81 mL . Echogenicity within normal limits. No mass or hydronephrosis visualized. Left Kidney: Renal measurements: 9.3 x 4.2 x 4.0 cm = volume: 81.1 mL. Echogenicity within normal limits. No mass. Pelvocaliectasis noted. Bladder: Contains of Foley catheter.  Otherwise unremarkable. Other: Bilateral pleural effusions noted.  Gallbladder sludge present. IMPRESSION: 1. Small bilateral  kidneys. No evidence for high-grade obstructive uropathy. 2. Left pelvocaliectasis. Electronically Signed   By: Kerby Moors M.D.   On: 01/31/2020 12:00   DG CHEST PORT 1 VIEW  Result Date: 01/31/2020 CLINICAL DATA:  Follow-up pneumonia EXAM: PORTABLE CHEST 1 VIEW COMPARISON:  01/30/2019 FINDINGS: Cardiomegaly, vascular congestion. Improving bilateral airspace disease. Continued bilateral lower lobe consolidation, left greater than right. No visible significant effusions or pneumothorax. IMPRESSION: Improving bilateral airspace disease with residual bibasilar opacities. Electronically Signed   By: Rolm Baptise M.D.   On: 01/31/2020 13:14   DG Chest Portable 1 View  Result Date: 01/30/2020 CLINICAL DATA:  Shortness of breath EXAM: PORTABLE CHEST 1 VIEW COMPARISON:  01/27/2020 FINDINGS: Stable cardiomegaly. Atherosclerotic calcification of the aortic knob. Small bilateral pleural effusions with associated bibasilar opacities. Bilateral interstitial prominence suggesting mild edema. No pneumothorax is seen. IMPRESSION: Findings suggesting CHF with mild edema and bilateral pleural effusions. Electronically Signed   By: Davina Poke D.O.   On: 01/30/2020 14:23   US Abdomen Limited RUQ  Result Date: 01/30/2020 CLINICAL DATA:  Abnormal LFTs EXAM: ULTRASOUND ABDOMEN LIMITED RIGHT UPPER QUADRANT COMPARISON:  None. FINDINGS: Gallbladder: Gallbladder is well distended with gallbladder sludge within. No definitive cholelithiasis is seen. No wall thickening or pericholecystic fluid is noted. Common bile duct: Diameter: 8 mm which is within normal limits for the patient's given age. Liver: No focal lesion identified. Within normal limits in parenchymal echogenicity. Portal vein is patent on color Doppler imaging with normal direction of blood flow towards the liver. Other: Small right pleural effusion is noted. IMPRESSION: Gallbladder sludge without cholelithiasis. Small right pleural effusion.  Electronically Signed   By: Inez Catalina M.D.   On: 01/30/2020 18:47    CT angiogram chest 01/27/2020: Cardiovascular: This is a technically adequate evaluation of the pulmonary vasculature. There are no filling defects or pulmonary emboli.  Heart is mildly enlarged, with prominent biatrial dilatation, right greater than left. Reflux of contrast into the hepatic veins suggest a degree of cardiac dysfunction.  Thoracic aorta demonstrates normal caliber, with extensive atherosclerosis.  Mediastinum/Nodes: No enlarged mediastinal, hilar, or axillary lymph nodes. Thyroid gland, trachea, and esophagus demonstrate no significant findings.  Cardiac Studies:   Echocardiogram 01/31/2020:     1. Left ventricular ejection fraction, by estimation, is <20%. Left ventricular ejection fraction by PLAX is 23 %. The left ventricle has severely  decreased function. The left ventricle demonstrates global hypokinesis. Left ventricular diastolic function could not be evaluated.   2. Right ventricular systolic function is severely reduced. The right ventricular size is moderately enlarged. There is mildly elevated pulmonary artery  systolic pressure. The estimated right ventricular systolic pressure is 76.8 mmHg.   3. Left atrial size was severely dilated.   4. Right atrial size was severely dilated.   5. Moderate pleural effusion in both left and right lateral regions.   6. The mitral valve is grossly normal. Severe mitral valve regurgitation.   7. Tricuspid valve regurgitation is moderate to severe.   8. The aortic valve is normal in structure. Aortic valve regurgitation is not visualized. No aortic  stenosis is present.  EKG 01/21/2020: Sinus tachycardia at rate of 113 bpm, Right atrial enlargement, normal axis, poor R wave progression, cannot exclude anteroseptal infarct old.  Borderline criteria for LVH.  Nonspecific T abnormality.  Telemetry: Independently reviewed, 01/31/2020: Sinus tachycardia  from 111 beats 130 bpm, rare PACs.  Rare atrial couplets.  Medications:   Scheduled Meds: . bisacodyl  10 mg Rectal Daily  . Chlorhexidine Gluconate Cloth  6 each Topical Daily  . digoxin  0.125 mg Oral Daily  . feeding supplement (ENSURE ENLIVE)  237 mL Oral BID BM  . ivabradine  5 mg Oral BID WC  . metoprolol tartrate  12.5 mg Oral BID  . multivitamin with minerals  1 tablet Oral Daily  . nutrition supplement (JUVEN)  1 packet Oral BID BM  . senna-docusate  1 tablet Oral BID  . torsemide  20 mg Oral Daily   Continuous Infusions:  PRN Meds:.albuterol, LORazepam, ondansetron (ZOFRAN) IV, oxyCODONE-acetaminophen, polyethylene glycol   Assessment   1.  Acute decompensated heart failure, acute systolic biventricular heart failure 2.  CT findings suggestive of pulmonary hypertension with right atrial enlargement and hepatic reflux of contrast. 3.  Sinus tachycardia/brief atrial tachycardia and occasional PVCs 4.  Abnormal LFTs related to hepatic congestion improving 6.  Mitral valvular heart disease  Recommendations:   Continue digoxin and Low dose Metoprolol.  Revatio discontinued due to persistent hypotension. However probably worthwhile to challenge again if BP is stable tomorrow at 20 mg TID.  HR still up, will increase Corlanor to 7.88m BID. Continue low dose Metoprolol.  Up in chair for meals. Ambulate. Continue Torsemide 20 mg daily.   JAdrian Prows MD, FArise [facility] Center5/17/2021, 9:02 AM [facility]. PDickinsonOffice: [phone]

## 2020-02-06 NOTE — Progress Notes (Signed)
  Speech Language Pathology Treatment: Dysphagia  Patient Details Name: Anita Martinez MRN: [id number] DOB: [birth year] Today's Date: 02/06/2020 Time: 0910-0920 SLP Time Calculation (min) (ACUTE ONLY): 10 min  Assessment / Plan / Recommendation Clinical Impression  Pt was seen for skilled ST targeting diet tolerance.  She was encountered awake/alert with daughter present at bedside and she was agreeable to this tx session.  Pt, daughter, and RN all reported that the pt had been tolerating her current diet (Dysphagia 3/thin liquid) without overt s/sx of aspiration or difficulty.  Pt's daughter reported that the Dysphagia 3 diet is close to what the pt consumes at home.  Pt was seen with trials of regular solids (graham cracker) and thin liquid via straw sip.  She demonstrated mildly prolonged mastication, but it was effective and no oral residue was observed.  No overt s/sx of aspiration were observed with any trials.  Discussed upgrading the pt to regular solids, but she and her daughter both stated that they would prefer for her to remain on Dysphagia 3 (soft) solids at this time.  Therefore, recommend continuation of Dysphagia 3 solids and thin liquids with medications administered whole with liquid.  No further skilled ST is warranted at this time.  Please re-consult if additional needs arise.   HPI HPI: 86yo female admitted 01/31/20 with generalized weakness, unable to stand/walk, dyspnea on exertion. PMH: HOH, chronic back pain, opiod dependence, anxiety, depression, HLD. HeadCT = no acute abnormality, generalized cerebral atrophy. CXR = CHF, mild edema, B pleural effusions      SLP Plan  All goals met       Recommendations  Diet recommendations: Dysphagia 3 (mechanical soft);Thin liquid Liquids provided via: Straw;Cup Medication Administration: Whole meds with liquid Supervision: Patient able to self feed Compensations: Slow rate;Small sips/bites;Minimize environmental  distractions Postural Changes and/or Swallow Maneuvers: Seated upright 90 degrees                Oral Care Recommendations: Oral care BID Follow up Recommendations: None SLP Visit Diagnosis: Dysphagia, unspecified (R13.10) Plan: All goals met       GO               Colin Mulders., M.S., [facility] Office: [phone]  [place] 02/06/2020, 9:30 AM

## 2020-02-06 NOTE — Progress Notes (Signed)
PROGRESS NOTE    Anita Martinez  HER:[id number]  DOB: Dec 27, 1932  PCP: Leeroy Cha Admit date:01/30/2020 86 year old female, lives alone, very independent, drove herself around until a week ago, PMH of colon resection with chronic abdominal wall hernia, chronic back pain, opioid dependent, anxiety/depression, hyperlipidemia who is extremely hard of hearing, unwilling to use hearing aids, presented initially to ED on 5/7 with c/o generalized weakness limiting ambulation, dyspnea on exertion-CTA chest was negative for PE. BNP was elevated. Admission was recommended in concern for new onset heart failure, dehydration, possible UTI. Patient left AMA. She was given a prescription for Keflex and information to call cardiology. However condition worsened 5/10- per  daughter patient was extremely weak, crawled to the front door to open.Patient completed her second dose of COVID-19 Pfizer vaccine on January 07, 2020.  ED Course: Afebrile. Pulsox 90 to 93% RA. .SBP 90-100S. Persistent tachycardia in 120s. CMP  With bicarb 19, BUN 44, calcium 8.8, albumin 3.4, AST 190, ALT 184, total bilirubin 2.7, direct 1.5. BNP 2893.5. Troponin 25 >28. Lactate 3.7 up from 2.2,3 days ago. CBC significant PLT 129. INR up to 1.9. U/A from 5/7  showed many bacteria, 21-50 WBCs. Urine culture had shown Klebsiella pneumonia sensitive to ceftriaxone. Blood cultures sent..CT head: Generalized cerebral atrophy. CXR: CHF with mild edema and bilateral pleural effusions. CTA chest from 5/7: No evidence of pulmonary embolism. Findings consistent with CHF with moderate bilateral pleural effusions and scattered groundglass airspace disease.  Hospital course: Patient admitted to [facility] for further evaluation and management. Started on diuretics with lasix. Cardiology consulted. Echo 5/11 showed EF <20%. RV moderately enlarged. LA/RA severely dilated. HC complicated by hypotension and hyponatremia in the setting of diuresis.  Cardiology deferred entresto due to low BP. BUN/creatinine peaked at 57/1.36.Wbcpeaked at 15.5,now trending down,lactic acid wnl. Cardiology Spoke to both patient and her daughter that the patient is quite ill and recommended medical therapy. They are in agreement and do not want to proceed with invasive procedures like cardiac cath.   Subjective:  Patient seen in SDU. SBP 90 to low 120s. Continues to have PVCs. Seen by cardiology this morning. Daughter at bedside. Patient is hard of hearing but denies chest pain, palpitations or dizziness.   Objective: Vitals:   02/06/20 1600 02/06/20 1700 02/06/20 1800 02/06/20 1826  BP: (!) 103/48 (!) 125/44  (!) 120/29  Pulse: 86 84 82 93  Resp: 13 15 17 18   Temp: 98 F (36.7 C)     TempSrc: Oral     SpO2: 94% 94% 94% 93%  Weight:      Height:        Intake/Output Summary (Last 24 hours) at 02/06/2020 1836 Last data filed at 02/06/2020 1700 Gross per 24 hour  Intake 800 ml  Output 1950 ml  Net -1150 ml   Filed Weights   02/03/20 0500 02/04/20 0500 02/06/20 0333  Weight: 54.2 kg 54.8 kg 47 kg    Physical Examination:  General exam: Appears calm and comfortable  Respiratory system: Clear to auscultation. Respiratory effort normal. Cardiovascular system: S1 & S2 heard, tachycardic.+ JVD, ++ systolic murmur. No pedal edema. Gastrointestinal system: Abdomen is nondistended, soft and nontender. Normal bowel sounds heard. Central nervous system: Alert and oriented. Hard of hearing. No new focal neurological deficits. Extremities: No contractures, edema or joint deformities.  Skin: area of skin redness along sacral area and upper back, no bleeding /sgns od infection. Allevyn patch in place.  Psychiatry:  Mood & affect appropriate.  Data Reviewed: I have personally reviewed following labs and imaging studies  CBC: Recent Labs  Lab 02/03/20 0214 02/03/20 0916 02/04/20 0822 02/05/20 0318 02/06/20 0234  WBC 12.3* 13.3* 15.5* 14.4*  13.4*  NEUTROABS 10.3* 11.3* 12.4* 11.3* 10.4*  HGB 13.2 14.5 14.0 13.5 13.6  HCT 40.4 44.6 43.0 42.2 42.0  MCV 86.7 88.0 85.8 87.2 87.7  PLT 69* 75* 84* 90* 101*   Basic Metabolic Panel: Recent Labs  Lab 02/02/20 0242 02/03/20 0214 02/04/20 0248 02/05/20 0318 02/06/20 0234  NA 131* 134* 132* 132* 132*  K 3.9 2.5* 4.9 3.8 3.6  CL 98 94* 91* 92* 91*  CO2 23 30 32 31 31  GLUCOSE 122* 111* 111* 106* 111*  BUN 56* 35* 24* 22 21  CREATININE 1.02* 0.68 0.69 0.64 0.64  CALCIUM 7.7* 7.9* 7.9* 7.7* 8.0*  MG  --  1.7  --  2.1  --    GFR: Estimated Creatinine Clearance: 38.1 mL/min (by C-G formula based on SCr of 0.64 mg/dL). Liver Function Tests: Recent Labs  Lab 02/02/20 0242 02/03/20 0214 02/04/20 0248 02/05/20 0318 02/06/20 0234  AST 392* 274* 191* 112* 84*  ALT 291* 225* 175* 113* 89*  ALKPHOS 127* 106 103 91 94  BILITOT 1.2 1.6* 1.6* 1.7* 1.8*  PROT 5.7* 5.2* 5.7* 5.4* 5.6*  ALBUMIN 2.6* 2.3* 2.6* 2.3* 2.4*   No results for input(s): LIPASE, AMYLASE in the last 168 hours. No results for input(s): AMMONIA in the last 168 hours. Coagulation Profile: Recent Labs  Lab 02/01/20 0240 02/04/20 0248  INR 2.7* 2.3*   Cardiac Enzymes: No results for input(s): CKTOTAL, CKMB, CKMBINDEX, TROPONINI in the last 168 hours. BNP (last 3 results) No results for input(s): PROBNP in the last 8760 hours. HbA1C: No results for input(s): HGBA1C in the last 72 hours. CBG: Recent Labs  Lab 01/31/20 0600  GLUCAP 124*   Lipid Profile: No results for input(s): CHOL, HDL, LDLCALC, TRIG, CHOLHDL, LDLDIRECT in the last 72 hours. Thyroid Function Tests: No results for input(s): TSH, T4TOTAL, FREET4, T3FREE, THYROIDAB in the last 72 hours. Anemia Panel: No results for input(s): VITAMINB12, FOLATE, FERRITIN, TIBC, IRON, RETICCTPCT in the last 72 hours. Sepsis Labs: Recent Labs  Lab 02/03/20 0214 02/05/20 0318 02/06/20 0234  PROCALCITON  --  0.12 <0.10  LATICACIDVEN 1.1  --  1.0     Recent Results (from the past 240 hour(s))  Blood culture (routine x 2)     Status: None   Collection Time: 01/30/20  1:36 PM   Specimen: BLOOD  Result Value Ref Range Status   Specimen Description   Final    BLOOD LEFT ANTECUBITAL Performed at [facility], [address] [place], [place] [address]    Special Requests   Final    BOTTLES DRAWN AEROBIC AND ANAEROBIC Blood Culture adequate volume Performed at [facility], [address], [place], [place] [address]    Culture   Final    NO GROWTH 5 DAYS Performed at [facility], [address], [place], [place] [address]    Report Status 02/04/2020 FINAL  Final  SARS Coronavirus 2 by RT PCR (hospital order, performed in [facility] hospital lab) Nasopharyngeal Nasopharyngeal Swab     Status: None   Collection Time: 01/30/20  3:06 PM   Specimen: Nasopharyngeal Swab  Result Value Ref Range Status   SARS Coronavirus 2 NEGATIVE NEGATIVE Final    Comment: (NOTE) SARS-CoV-2 target nucleic acids are NOT  DETECTED. The SARS-CoV-2 RNA is generally detectable in upper and lower respiratory specimens during the acute phase of infection. The lowest concentration of SARS-CoV-2 viral copies this assay can detect is 250 copies / mL. A negative result does not preclude SARS-CoV-2 infection and should not be used as the sole basis for treatment or other patient management decisions.  A negative result may occur with improper specimen collection / handling, submission of specimen other than nasopharyngeal swab, presence of viral mutation(s) within the areas targeted by this assay, and inadequate number of viral copies (<250 copies / mL). A negative result must be combined with clinical observations, patient history, and epidemiological information. Fact Sheet for Patients:   BoilerBrush.com.cy Fact Sheet for Healthcare  Providers: https://pope.com/ This test is not yet approved or cleared  by the [place] FDA and has been authorized for detection and/or diagnosis of SARS-CoV-2 by FDA under an Emergency Use Authorization (EUA).  This EUA will remain in effect (meaning this test can be used) for the duration of the COVID-19 declaration under Section 564(b)(1) of the Act, 21 U.S.C. section 360bbb-3(b)(1), unless the authorization is terminated or revoked sooner. Performed at [facility] [address] [place], [place] [address]   Urine culture     Status: None   Collection Time: 01/31/20  6:37 AM   Specimen: Urine, Catheterized  Result Value Ref Range Status   Specimen Description   Final    URINE, CATHETERIZED Performed at [facility], [address], [place], [place] [address]    Special Requests   Final    NONE Performed at [facility], [address] [place], [place] [address]    Culture   Final    NO GROWTH Performed at [facility], [address], [place], [place] [address]    Report Status 02/01/2020 FINAL  Final  MRSA PCR Screening     Status: None   Collection Time: 01/31/20 12:27 PM   Specimen: Nasal Mucosa; Nasopharyngeal  Result Value Ref Range Status   MRSA by PCR NEGATIVE NEGATIVE Final    Comment:        The GeneXpert MRSA Assay (FDA approved for NASAL specimens only), is one component of a comprehensive MRSA colonization surveillance program. It is not intended to diagnose MRSA infection nor to guide or monitor treatment for MRSA infections. Performed at [facility], [address] [place], [place] [address]       Radiology Studies: No results found.      Scheduled Meds: . Chlorhexidine Gluconate Cloth  6 each Topical Daily  . digoxin  0.125 mg Oral Daily  . feeding supplement (ENSURE ENLIVE)  237 mL Oral BID BM  . ivabradine  5 mg Oral BID  WC  . metoprolol tartrate  12.5 mg Oral BID  . multivitamin with minerals  1 tablet Oral Daily  . nutrition supplement (JUVEN)  1 packet Oral BID BM  . senna-docusate  1 tablet Oral BID  . torsemide  20 mg Oral Daily   Continuous Infusions:   Assessment/Plan:  Acute decompensation biventricular systolic heart failure: Bilateral pleural effusions and borderline O2 sats (90-92%) on presentation. Echo with EF <20%. Aggressive diuresis limited by hypotension. Now on PO torsemide, monitor I/Os. Continue digoxin, continue metoprolol 12.5 mg p.o. twice daily with holding parameters for BP. Noted that cardiology has added Ivabradine for tachycardia . Medical therapy optimization per cardiology in view of advanced age, no intervention planned. Improving  renal function and hepatic function and BNP.  Hypotension: in the setting of low cardiac output, multiple cardiac meds/diuretics. Likely contributing to dizziness/limited ambulation. Ted hose for possible postural component. Not sure if she will tolerate Entresto. Off Revapro.  AKI on CKD -3A: due to cardiorenal syndrome/hypotension. Renal ultrasound demonstrates no obstructive uropathyy.   Leucocytosis/ Klebsiella UTI: treated as outpatient with abx.She received total of 3 dose of Rocephin since admission, UA on May 11 no bacteria seen, blood culture negative-antibiotics now d/ced.   Dysphagia, H/o colon resection with chronic abdominal wall hernia: Given abnormal CT chest on admission, aspiration was suspected (although could be sec to CHF) and swallow eval obtained. Started on dysphagia diet. Wbcpeaked at 15.5,appears start to trend down,lactic acid within normal limit. No fever, no cough.Monitor off antibiotic for now   Abnormal liver function: Likely due to liver congestion from heart failure Acute hepatitis panel negative. LFT now trending down  Hypervolemic hyponatremia-Likely from acute heart failure, currently on Lasix,  -sodium  nadir at 131, slowly improving  Hypokalemia/hypomagnesemia-Replaced,monitor  Acute thrombocytopenia:  from liver congestion versus antibiotics?-Unclear etiology, no exposure to heparin.No sign of bleeding- platelet appeared nadired at 69-f/u CBC shows slow uptrend to 100 now  QTC prolongation-QTC 515 on presentation.Home med amitriptyline discontinued QTC 471 on repeat. Continue telemetry, keep potassium above 4, magnesium above 2  Stage II sacral pressure ulcer: Wound care is consulted  FTT; previously independent, driving, currently very weak with fluctuating SBPs. Seen by PT- recommended d/c home with [facility] PT/rolling walker. Palliative care consult for goals of care discussion.     DVT prophylaxis: SCD given low platelets Code Status: DNR Family / Patient Communication: d/w daughter at bedside.  Disposition Plan:  transfer to telemetry floor if BP remains stable  Status is: Inpatient  Remains inpatient appropriate because:Hemodynamically unstable, low BP, needs further cardiac med optimization, goals of care outlined and consultant sign off   Dispo: The patient is from: Home  Anticipated d/c is to: Home  Anticipated d/c date is: 2 days  Patient currently is not medically stable to d/c.    LOS: 6 days    Time spent:     Alessandra Bevels, MD [facility] Pager in [place]  If 7PM-7AM, please contact night-coverage [email or web address] 02/06/2020, 6:36 PM

## 2020-02-06 NOTE — TOC Progression Note (Signed)
Transition of Care [facility]) - Progression Note    Patient Details  Name: Anita Martinez MRN: [id number] Date of Birth: 1933/08/26  [facility] CM/SW Contact  Golda Acre, RN Phone Number: 02/06/2020, 9:47 AM  Clinical Narrative:    Acute CHF, hx of multiple fall at home, was seen in ed before admission and left ama, lives alone has daughter, has pcp, may require short term snf for rehab due to falls.   Expected Discharge Plan: Skilled Nursing Facility Barriers to Discharge: Continued Medical Work up  Expected Discharge Plan and Services Expected Discharge Plan: Skilled Nursing Facility   Discharge Planning Services: CM Consult   Living arrangements for the past 2 months: Single Family Home                                       Social Determinants of Health (SDOH) Interventions    Readmission Risk Interventions No flowsheet data found.

## 2020-02-06 NOTE — Progress Notes (Signed)
Patient had not voided all night when RN checked again at 0300. Patient reports sensation of urgency, but unable to void. Bladder scanning revealed 704 ml urine in bladder. In/out catheterization done per orders and approximately 750 ml urine drained from bladder. Patient reported comfort and relief after bladder drained. Repositioned and clean underpad placed. Will continue to monitor.

## 2020-02-07 DIAGNOSIS — R338 Other retention of urine: Secondary | ICD-10-CM

## 2020-02-07 LAB — COMPREHENSIVE METABOLIC PANEL
ALT: 76 U/L — ABNORMAL HIGH (ref 0–44)
AST: 80 U/L — ABNORMAL HIGH (ref 15–41)
Albumin: 2.5 g/dL — ABNORMAL LOW (ref 3.5–5.0)
Alkaline Phosphatase: 91 U/L (ref 38–126)
Anion gap: 10 (ref 5–15)
BUN: 19 mg/dL (ref 8–23)
CO2: 31 mmol/L (ref 22–32)
Calcium: 8.3 mg/dL — ABNORMAL LOW (ref 8.9–10.3)
Chloride: 94 mmol/L — ABNORMAL LOW (ref 98–111)
Creatinine, Ser: 0.63 mg/dL (ref 0.44–1.00)
GFR calc Af Amer: >60 mL/min (ref >60)
GFR calc non Af Amer: >60 mL/min (ref >60)
Glucose, Bld: 100 mg/dL — ABNORMAL HIGH (ref 70–99)
Potassium: 3.8 mmol/L (ref 3.5–5.1)
Sodium: 135 mmol/L (ref 135–145)
Total Bilirubin: 1.7 mg/dL — ABNORMAL HIGH (ref 0.3–1.2)
Total Protein: 5.9 g/dL — ABNORMAL LOW (ref 6.5–8.1)

## 2020-02-07 NOTE — Care Management Important Message (Signed)
Important Message  Patient Details IM Letter given to Ezekiel Ina RN Case Manager to present to the Patient Name: Anita Martinez MRN: [id number] Date of Birth: Mar 28, 1933   Medicare Important Message Given:  Yes     Caren Macadam 02/07/2020, 9:56 AM

## 2020-02-07 NOTE — Progress Notes (Signed)
Patient has walked to bathroom with walker and voided twice since removal of foley catheter.

## 2020-02-07 NOTE — Progress Notes (Signed)
PROGRESS NOTE    Anita Martinez  QRF:[id number]  DOB: 11-Sep-1933  PCP: [name] Admit date:01/30/2020 86 year old female, lives alone, very independent, drove herself around until a week ago, PMH of colon resection with chronic abdominal wall hernia, chronic back pain, opioid dependent, anxiety/depression, hyperlipidemia who is extremely hard of hearing, unwilling to use hearing aids, presented initially to ED on 5/7 with c/o generalized weakness limiting ambulation, dyspnea on exertion-CTA chest was negative for PE. BNP was elevated. Admission was recommended in concern for new onset heart failure, dehydration, possible UTI. Patient left AMA. She was given a prescription for Keflex and information to call cardiology. However condition worsened 5/10- per  daughter patient was extremely weak, crawled to the front door to open.Patient completed her second dose of COVID-19 Pfizer vaccine on January 07, 2020.  ED Course: Afebrile. Pulsox 90 to 93% RA. .SBP 90-100S. Persistent tachycardia in 120s. CMP  With bicarb 19, BUN 44, calcium 8.8, albumin 3.4, AST 190, ALT 184, total bilirubin 2.7, direct 1.5. BNP 2893.5. Troponin 25 >28. Lactate 3.7 up from 2.2,3 days ago. CBC significant PLT 129. INR up to 1.9. U/A from 5/7  showed many bacteria, 21-50 WBCs. Urine culture had shown Klebsiella pneumonia sensitive to ceftriaxone. Blood cultures sent..CT head: Generalized cerebral atrophy. CXR: CHF with mild edema and bilateral pleural effusions. CTA chest from 5/7: No evidence of pulmonary embolism. Findings consistent with CHF with moderate bilateral pleural effusions and scattered groundglass airspace disease.  Hospital course: Patient admitted to [facility] for further evaluation and management. Started on diuretics with lasix. Cardiology consulted. Echo 5/11 showed EF <20%. RV moderately enlarged. LA/RA severely dilated. HC complicated by hypotension and hyponatremia in the setting of diuresis.  Cardiology deferred entresto due to low BP. BUN/creatinine peaked at 57/1.36.Wbcpeaked at 15.5,now trending down,lactic acid wnl. Cardiology Spoke to both patient and her daughter that the patient is quite ill and recommended medical therapy. They are in agreement and do not want to proceed with invasive procedures like cardiac cath.   Subjective:  Patient seems improved today in terms of BP. Daughter at bedside and concerned about taking patient home with indwelling foley.Walked with PT earlier  Objective: Vitals:   02/07/20 0536 02/07/20 0905 02/07/20 0915 02/07/20 1241  BP: 123/66 (!) 121/44  114/60  Pulse: 92 (!) 59 97 91  Resp: 18 16    Temp: (!) 97.5 F (36.4 C) 98.4 F (36.9 C)  98.4 F (36.9 C)  TempSrc: Oral Oral  Oral  SpO2: 95% 96%  92%  Weight:      Height:        Intake/Output Summary (Last 24 hours) at 02/07/2020 1809 Last data filed at 02/07/2020 1040 Gross per 24 hour  Intake --  Output 2200 ml  Net -2200 ml   Filed Weights   02/04/20 0500 02/06/20 0333 02/07/20 0500  Weight: 54.8 kg 47 kg 47.1 kg    Physical Examination:  General exam: Appears calm and comfortable  Respiratory system: Clear to auscultation. Respiratory effort normal. Cardiovascular system: S1 & S2 heard, tachycardic.+ JVD, ++ systolic murmur. No pedal edema. Gastrointestinal system: Abdomen is nondistended, soft and nontender. Normal bowel sounds heard. Central nervous system: Alert and oriented. Hard of hearing. No new focal neurological deficits. Extremities: No contractures, edema or joint deformities.  Skin: area of skin redness along sacral area and upper back, no bleeding /sgns od infection. Allevyn patch in place.  Psychiatry:  Mood & affect appropriate.   Data Reviewed: I have personally  reviewed following labs and imaging studies  CBC: Recent Labs  Lab 02/03/20 0214 02/03/20 0916 02/04/20 0822 02/05/20 0318 02/06/20 0234  WBC 12.3* 13.3* 15.5* 14.4* 13.4*  NEUTROABS  10.3* 11.3* 12.4* 11.3* 10.4*  HGB 13.2 14.5 14.0 13.5 13.6  HCT 40.4 44.6 43.0 42.2 42.0  MCV 86.7 88.0 85.8 87.2 87.7  PLT 69* 75* 84* 90* 101*   Basic Metabolic Panel: Recent Labs  Lab 02/03/20 0214 02/04/20 0248 02/05/20 0318 02/06/20 0234 02/07/20 0357  NA 134* 132* 132* 132* 135  K 2.5* 4.9 3.8 3.6 3.8  CL 94* 91* 92* 91* 94*  CO2 30 32 31 31 31   GLUCOSE 111* 111* 106* 111* 100*  BUN 35* 24* 22 21 19   CREATININE 0.68 0.69 0.64 0.64 0.63  CALCIUM 7.9* 7.9* 7.7* 8.0* 8.3*  MG 1.7  --  2.1  --   --    GFR: Estimated Creatinine Clearance: 38.2 mL/min (by C-G formula based on SCr of 0.63 mg/dL). Liver Function Tests: Recent Labs  Lab 02/03/20 0214 02/04/20 0248 02/05/20 0318 02/06/20 0234 02/07/20 0357  AST 274* 191* 112* 84* 80*  ALT 225* 175* 113* 89* 76*  ALKPHOS 106 103 91 94 91  BILITOT 1.6* 1.6* 1.7* 1.8* 1.7*  PROT 5.2* 5.7* 5.4* 5.6* 5.9*  ALBUMIN 2.3* 2.6* 2.3* 2.4* 2.5*   No results for input(s): LIPASE, AMYLASE in the last 168 hours. No results for input(s): AMMONIA in the last 168 hours. Coagulation Profile: Recent Labs  Lab 02/01/20 0240 02/04/20 0248  INR 2.7* 2.3*   Cardiac Enzymes: No results for input(s): CKTOTAL, CKMB, CKMBINDEX, TROPONINI in the last 168 hours. BNP (last 3 results) No results for input(s): PROBNP in the last 8760 hours. HbA1C: No results for input(s): HGBA1C in the last 72 hours. CBG: No results for input(s): GLUCAP in the last 168 hours. Lipid Profile: No results for input(s): CHOL, HDL, LDLCALC, TRIG, CHOLHDL, LDLDIRECT in the last 72 hours. Thyroid Function Tests: No results for input(s): TSH, T4TOTAL, FREET4, T3FREE, THYROIDAB in the last 72 hours. Anemia Panel: No results for input(s): VITAMINB12, FOLATE, FERRITIN, TIBC, IRON, RETICCTPCT in the last 72 hours. Sepsis Labs: Recent Labs  Lab 02/03/20 0214 02/05/20 0318 02/06/20 0234  PROCALCITON  --  0.12 <0.10  LATICACIDVEN 1.1  --  1.0    Recent Results  (from the past 240 hour(s))  Blood culture (routine x 2)     Status: None   Collection Time: 01/30/20  1:36 PM   Specimen: BLOOD  Result Value Ref Range Status   Specimen Description   Final    BLOOD LEFT ANTECUBITAL Performed at [facility], [address] [place], [place]    Special Requests   Final    BOTTLES DRAWN AEROBIC AND ANAEROBIC Blood Culture adequate volume Performed at [facility], [address], [place], [place]    Culture   Final    NO GROWTH 5 DAYS Performed at [facility], [address], [place], [address] [place]    Report Status 02/04/2020 FINAL  Final  SARS Coronavirus 2 by RT PCR (hospital order, performed in [facility] hospital lab) Nasopharyngeal Nasopharyngeal Swab     Status: None   Collection Time: 01/30/20  3:06 PM   Specimen: Nasopharyngeal Swab  Result Value Ref Range Status   SARS Coronavirus 2 NEGATIVE NEGATIVE Final    Comment: (NOTE) SARS-CoV-2 target nucleic acids are NOT DETECTED. The SARS-CoV-2 RNA is generally  detectable in upper and lower respiratory specimens during the acute phase of infection. The lowest concentration of SARS-CoV-2 viral copies this assay can detect is 250 copies / mL. A negative result does not preclude SARS-CoV-2 infection and should not be used as the sole basis for treatment or other patient management decisions.  A negative result may occur with improper specimen collection / handling, submission of specimen other than nasopharyngeal swab, presence of viral mutation(s) within the areas targeted by this assay, and inadequate number of viral copies (<250 copies / mL). A negative result must be combined with clinical observations, patient history, and epidemiological information. Fact Sheet for Patients:   BoilerBrush.com.cy Fact Sheet for Healthcare Providers: https://pope.com/ This test is not yet  approved or cleared  by the [place] FDA and has been authorized for detection and/or diagnosis of SARS-CoV-2 by FDA under an Emergency Use Authorization (EUA).  This EUA will remain in effect (meaning this test can be used) for the duration of the COVID-19 declaration under Section 564(b)(1) of the Act, 21 U.S.C. section 360bbb-3(b)(1), unless the authorization is terminated or revoked sooner. Performed at [facility], [address] [place], [place] [address]   Urine culture     Status: None   Collection Time: 01/31/20  6:37 AM   Specimen: Urine, Catheterized  Result Value Ref Range Status   Specimen Description   Final    URINE, CATHETERIZED Performed at [facility], [address], [place], [place] [address]    Special Requests   Final    NONE Performed at [facility], [address] [place], [place] [address]    Culture   Final    NO GROWTH Performed at [facility], [address], [place], [place] [address]    Report Status 02/01/2020 FINAL  Final  MRSA PCR Screening     Status: None   Collection Time: 01/31/20 12:27 PM   Specimen: Nasal Mucosa; Nasopharyngeal  Result Value Ref Range Status   MRSA by PCR NEGATIVE NEGATIVE Final    Comment:        The GeneXpert MRSA Assay (FDA approved for NASAL specimens only), is one component of a comprehensive MRSA colonization surveillance program. It is not intended to diagnose MRSA infection nor to guide or monitor treatment for MRSA infections. Performed at [facility], [address], [place], [place] [address]       Radiology Studies: No results found.      Scheduled Meds:  Chlorhexidine Gluconate Cloth  6 each Topical Daily   digoxin  0.125 mg Oral Daily   feeding supplement (ENSURE ENLIVE)  237 mL Oral BID BM   ivabradine  7.5 mg Oral BID WC   metoprolol tartrate  12.5 mg Oral BID   multivitamin with minerals   1 tablet Oral Daily   nutrition supplement (JUVEN)  1 packet Oral BID BM   senna-docusate  1 tablet Oral BID   torsemide  20 mg Oral Daily   Continuous Infusions:   Assessment/Plan:  Acute decompensation biventricular systolic heart failure: Bilateral pleural effusions and borderline O2 sats (90-92%) on presentation. Echo with EF <20%. Aggressive diuresis limited by hypotension. Now on PO torsemide, monitor I/Os. Continue digoxin, continue metoprolol 12.5 mg p.o. twice daily with holding parameters for BP. Noted that cardiology has added Ivabradine for tachycardia . Medical therapy optimization per cardiology in view of advanced age, no intervention planned. Improving renal function and hepatic function and  BNP.  Hypotension: in the setting of low cardiac output, multiple cardiac meds/diuretics. Likely contributing to dizziness/limited ambulation. Ted hose for possible postural component. Not sure if she will tolerate Entresto. Off Revapro.  AKI on CKD -3A: due to cardiorenal syndrome/hypotension. Renal ultrasound demonstrates Pelvocaliectasis but no hydronephrosis. Patient was retaining urine and had foley placed last week, failed foley removal 2 days prior per nurse with 780ml retention, foley had to be replaced. Concerned about additng flomax given hypotension. Since patient more ambulatory now, will try d/c foley again/voiding trial.   Leucocytosis/ Klebsiella UTI: treated as outpatient with abx.She received total of 3 dose of Rocephin since admission, UA on May 11 no bacteria seen, blood culture negative-antibiotics now d/ced.   Dysphagia, H/o colon resection with chronic abdominal wall hernia: Given abnormal CT chest on admission, aspiration was suspected (although could be sec to CHF) and swallow eval obtained. Started on dysphagia diet. Wbcpeaked at 15.5,appears start to trend down,lactic acid within normal limit. No fever, no cough.Monitor off antibiotic for now   Abnormal  liver function: Likely due to liver congestion from heart failure Acute hepatitis panel negative. LFT now trending down  Hypervolemic hyponatremia-Likely from acute heart failure, currently on Lasix,  -sodium nadir at 131, slowly improving  Hypokalemia/hypomagnesemia-Replaced,monitor  Acute thrombocytopenia:  from liver congestion versus antibiotics?-Unclear etiology, no exposure to heparin.No sign of bleeding- platelet appeared nadired at 69-f/u CBC shows slow uptrend to 100 now  QTC prolongation-QTC 515 on presentation.Home med amitriptyline discontinued QTC 471 on repeat. Continue telemetry, keep potassium above 4, magnesium above 2  Stage 1- II sacral pressure ulcer: Wound care consulted, has allevyn dressings in place  FTT; previously independent, driving, currently very weak with fluctuating SBPs. Seen by PT- recommended d/c home with [facility] PT/rolling walker. Palliative care consult for goals of care discussion.     DVT prophylaxis: SCD given low platelets Code Status: DNR Family / Patient Communication: d/w daughter at bedside.  Disposition Plan: Status is: Inpatient  Remains inpatient appropriate because: voiding trial,  cardiac med optimization, goals of care outlined and consultant sign off   Dispo: The patient is from: Home  Anticipated d/c is to: Home with [facility]  Anticipated d/c date is: 5/19  Patient currently is not medically stable to d/c.    LOS: 7 days    Time spent:     [place] Shi, MD [facility] Pager in [place]  If 7PM-7AM, please contact night-coverage [email or web address] 02/07/2020, 6:09 PM

## 2020-02-07 NOTE — Progress Notes (Signed)
Occupational Therapy Treatment Patient Details Name: Anita Martinez MRN: [id number] DOB: 08-07-1933 Today's Date: 02/07/2020    History of present illness 86yo female admitted 01/31/20 with generalized weakness, unable to stand/walk, dyspnea on exertion. PMH: HOH, chronic back pain, opiod dependence, anxiety, depression, HLD. HeadCT = no acute abnormality, generalized cerebral atrophy. CXR = CHF, mild edema, B pleural effusions , issues with low BP   OT comments  Patient progressing towards goals. Requires verbal and visual cues for carry over of directions for hand placement to maximize safety with functional transfers. Pt tolerate functional ambulation with rolling walker and min guard for safety, O2 initially reading 87% on room air once seated however poor wave form, increased to 95% quickly with improved wave form, HR with activity 120 and in chair 100. Will continue to follow.   Follow Up Recommendations  Home health OT;Supervision/Assistance - 24 hour    Equipment Recommendations  Other (comment)(walker if does not have one)       Precautions / Restrictions Precautions Precautions: Fall Precaution Comments: monitor vitals Restrictions Weight Bearing Restrictions: No       Mobility Bed Mobility Overal bed mobility: Needs Assistance Bed Mobility: Sit to Supine     Supine to sit: Supervision;HOB elevated     General bed mobility comments: use of bed rails to assist  Transfers Overall transfer level: Needs assistance Equipment used: Rolling walker (2 wheeled) Transfers: Sit to/from Stand Sit to Stand: Min guard         General transfer comment: cues for safe hand placement    Balance Overall balance assessment: Needs assistance Sitting-balance support: No upper extremity supported;Feet supported Sitting balance-Leahy Scale: Good     Standing balance support: Bilateral upper extremity supported;During functional activity Standing balance-Leahy Scale:  Fair Standing balance comment: increased safety with rolling walker                           ADL either performed or assessed with clinical judgement   ADL Overall ADL's : Needs assistance/impaired                     Lower Body Dressing: Supervision/safety;Sitting/lateral leans Lower Body Dressing Details (indicate cue type and reason): to pull up socks Toilet Transfer: Min guard;Ambulation;RW;Cueing for safety Toilet Transfer Details (indicate cue type and reason): simulated with transfer to recliner, decreased carry over/safety with cues for hand placement         Functional mobility during ADLs: Min guard;Rolling walker                 Cognition Arousal/Alertness: Awake/alert Behavior During Therapy: WFL for tasks assessed/performed Overall Cognitive Status: Within Functional Limits for tasks assessed                                 General Comments: HOH; daughter present              General Comments after ambulation pt O2 reading 87% however poor waveform, quickly recovers to 95% on room air with improved wave form. HR with activity noted up to120, in chair 100. denied dizziness with all activity    Pertinent Vitals/ Pain       Pain Assessment: No/denies pain         Frequency  Min 2X/week        Progress Toward Goals  OT Goals(current goals can now be  found in the care plan section)  Progress towards OT goals: Progressing toward goals  Acute Rehab OT Goals Patient Stated Goal: go home, to church OT Goal Formulation: With patient Time For Goal Achievement: 02/17/20 Potential to Achieve Goals: Good ADL Goals Pt Will Perform Grooming: with modified independence;standing;sitting Pt Will Perform Upper Body Dressing: Independently;sitting Pt Will Perform Lower Body Dressing: with modified independence;sit to/from stand;sitting/lateral leans Pt Will Transfer to Toilet: with modified independence;ambulating Pt Will  Perform Toileting - Clothing Manipulation and hygiene: with modified independence;sitting/lateral leans;sit to/from stand  Plan Discharge plan remains appropriate       AM-PAC OT "6 Clicks" Daily Activity     Outcome Measure   Help from another person eating meals?: None Help from another person taking care of personal grooming?: A Little Help from another person toileting, which includes using toliet, bedpan, or urinal?: A Little Help from another person bathing (including washing, rinsing, drying)?: A Little Help from another person to put on and taking off regular upper body clothing?: A Little Help from another person to put on and taking off regular lower body clothing?: A Little 6 Click Score: 19    End of Session Equipment Utilized During Treatment: Rolling walker  OT Visit Diagnosis: Unsteadiness on feet (R26.81);Muscle weakness (generalized) (M62.81)   Activity Tolerance Patient tolerated treatment well   Patient Left in chair;with call bell/phone within reach;with chair alarm set;with nursing/sitter in room;with family/visitor present   Nurse Communication Mobility status;Other (comment)(pt vitals post activity)        Time: 0240-9735 OT Time Calculation (min): 21 min  Charges: OT General Charges $OT Visit: 1 Visit OT Treatments $Self Care/Home Management : 8-22 mins  Delbert Phenix OT Pager: [place] 02/07/2020, 9:37 AM

## 2020-02-08 MED ORDER — TORSEMIDE 20 MG PO TABS: 20.0000 mg | ORAL_TABLET | Freq: Every day | ORAL | 1 refills | Status: DC

## 2020-02-08 MED ORDER — METOPROLOL TARTRATE 25 MG PO TABS: 12.5000 mg | ORAL_TABLET | Freq: Two times a day (BID) | ORAL | 2 refills | Status: DC

## 2020-02-08 MED ORDER — SILDENAFIL CITRATE 20 MG PO TABS: 20.0000 mg | ORAL_TABLET | Freq: Three times a day (TID) | ORAL | 0 refills | Status: DC

## 2020-02-08 MED ORDER — SILDENAFIL CITRATE 20 MG PO TABS
20.0000 mg | ORAL_TABLET | Freq: Three times a day (TID) | ORAL | Status: DC
  Administered 2020-02-08: 20 mg via ORAL
  Filled 2020-02-08 (×2): qty 1

## 2020-02-08 MED ORDER — SENNOSIDES-DOCUSATE SODIUM 8.6-50 MG PO TABS: 1.0000 | ORAL_TABLET | Freq: Two times a day (BID) | ORAL | 1 refills | Status: DC

## 2020-02-08 MED ORDER — ADULT MULTIVITAMIN W/MINERALS CH: 1.0000 | ORAL_TABLET | Freq: Every day | ORAL | 1 refills | Status: AC

## 2020-02-08 MED ORDER — ALBUTEROL SULFATE HFA 108 (90 BASE) MCG/ACT IN AERS
2.0000 | INHALATION_SPRAY | Freq: Four times a day (QID) | RESPIRATORY_TRACT | 1 refills | Status: DC | PRN
Start: 2020-02-08 — End: 2020-03-14

## 2020-02-08 MED ORDER — ENSURE ENLIVE PO LIQD: 237.0000 mL | Freq: Two times a day (BID) | ORAL | 12 refills | Status: AC

## 2020-02-08 MED ORDER — ALLEVYN ADHESIVE EX PADS: 1.0000 "application " | MEDICATED_PAD | Freq: Every day | CUTANEOUS | Status: DC

## 2020-02-08 MED ORDER — IVABRADINE HCL 7.5 MG PO TABS: 7.5000 mg | ORAL_TABLET | Freq: Two times a day (BID) | ORAL | 1 refills | Status: DC

## 2020-02-08 MED ORDER — DIGOXIN 125 MCG PO TABS: 0.1250 mg | ORAL_TABLET | Freq: Every day | ORAL | 1 refills | Status: DC

## 2020-02-08 NOTE — Progress Notes (Addendum)
No change from am assessment. Discharge instructions reviewed with patient and daughter, Gavin Pound. Additional heart failure and medication education provided. Questions/concers were denied at this time. Awaiting walker delivery.

## 2020-02-08 NOTE — Discharge Summary (Addendum)
Physician Discharge Summary  Anita Martinez EXN:[id number] DOB: Jan 18, 1933 DOA: 01/30/2020  PCP: Anita Martinez  Admit date: 01/30/2020 Discharge date: 02/08/2020 Consultations: Cardiology Dr Anita Martinez Admitted From: Home Disposition: home   Discharge Diagnoses:  Principal Problem:   Acute CHF (congestive heart failure) (HCC) Active Problems:   Pressure injury of skin   Acute heart failure (HCC)   Abnormal LFTs   AKI (acute kidney injury) [facility])   Hospital Course Summary: 86 year old female, lives alone, very independent, drove herself around until a week ago, PMHofcolon resection with chronic abdominal wall hernia,chronic back pain, opioid dependent, anxiety/depression, hyperlipidemiawho isextremelyhard of hearing, unwilling to use hearing aids,presentedinitiallyto [facility] 5/7with c/o generalized weaknesslimiting ambulation, dyspnea on exertion-CTA chest was negative for PE. BNP was elevated.Admission was recommended inconcern for new onset heart failure, dehydration, possible UTI. Patient left AMA.She was given a prescription for Keflex and information to call cardiology. Howevercondition worsened 5/10- perdaughter patient was extremely weak, crawled to the front door to open.Patient completed her second dose of COVID-19 Pfizer vaccine on January 07, 2020. ED Course: Afebrile. Pulsox 90 to 93% RA..SBP 90-100S.Persistent tachycardiain 120s.CMPWith bicarb19, BUN 44, calcium 8.8, albumin 3.4, AST 190, ALT 184, total bilirubin 2.7, direct 1.5. BNP 2893.5. Troponin 25 >28. Lactate 3.7 up from 2.2,3 days ago. CBC significantPLT129. INR up to 1.9. U/A from 5/7showed many bacteria, 21-50 WBCs. Urine culture had shown Klebsiella pneumonia sensitive to ceftriaxone. Blood culturessent..CT head: Generalized cerebral atrophy. CXR: CHF with mild edema and bilateral pleural effusions. CTA chest from 5/7: No evidence of pulmonary embolism. Findings consistent with  CHF with moderate bilateral pleural effusions and scattered groundglass airspace disease. Hospital course: [facility] and [facility]. Started on diuretics with lasix. Cardiology consulted. Echo 5/11 showed EF <20%. RV moderately enlarged. LA/RA severely dilated. HC complicated by hypotension and hyponatremia in the setting of diuresis. Cardiology deferred entresto due to low BP.BUN/creatinine peaked at 57/1.36.Wbcpeaked at 15.5,now trending down,lactic acidwnl. CardiologySpoke to both patient and her daughter that the patient is quite ill and recommendedmedical therapy. They agreed and stated they do not want to proceed with invasive procedures like cardiac cath.   Acute decompensation biventricular systolic heart failure: Bilateral pleural effusions and borderline O2 sats (90-92%) on presentation.Echo with EF <20%. Aggressive diuresis limited by hypotension. Now on PO torsemide and appears euvolemic, saturating well on RA. Continue digoxin, continue metoprolol 12.5 mg p.o. twice daily with holding parameters for BP. Noted that cardiology has added Ivabradine for tachycardia . Medical therapy optimizationper cardiologyin view of advanced age, no intervention planned. Improving renal function and hepatic function and BNP.  Hypotension: in the setting of low cardiac output, multiple cardiac meds/diuretics. Likely contributing to dizziness/limited ambulation. Ted hose for possible postural component. Entresto deferred. Initailly taken off Revapro but resumed by cardiology prior to discharge--daughter has a BP monitor at home. Holding parameters for cardiac meds explained to daughter. Per Dr Anita Martinez , okay as long as mean BP >60  and SBP 90- 100.  AKI on CKD -3A: due to cardiorenal syndrome/hypotension. Renal ultrasound demonstrates Pelvocaliectasis but no hydronephrosis. Patient was retaining urine and had foley placed last week, failed foley removal 2 days prior  per nurse with retention, foley had to be replaced. We were concerned about additng flomax given hypotension. Since patient more ambulatory now, voiding trial reattempted on 5/19 and she did well.  F/U PCP for repeat BMP in 1 week in the setting of diuretics.    Leucocytosis/ Klebsiella WHQ:PRFFMBW as outpatient with abx.She  received total of 3 dose of Rocephin since admission,UA on May 11 no bacteria seen, blood culture negative-antibioticsnowd/ced.   Dysphagia,H/o colon resection with chronic abdominal wall hernia: Given abnormal CT chest on admission, aspiration was suspected (although could be sec to CHF) and swallow eval obtained. Started on dysphagia diet -3. Daughter/patient aware.Wbcpeaked at 15.5,appears start to trend down,lactic acid within normal limit. Nofever, no cough. Did well off antibiotics while here.  Abnormal liver function:Likely due to liver congestion from heart failure. Acute hepatitis panel negative. LFT nowtrending down  Hypervolemic hyponatremia-Likely from acute heart failure, currently on Lasix,  -sodium nadir at 131, slowly improving. Repeat BMP in 1 week  Hypokalemia/hypomagnesemia-Replaced,monitor  Acute thrombocytopenia:from liver congestion versus antibiotics?-Unclear etiology, no exposure to heparin.No sign of bleeding-platelet appeared nadired at 69-f/u CBC shows slow uptrend to 100 now  QTC prolongation-QTC 515 on presentation.Home med amitriptyline discontinued. QTC 471 on repeat.Continue telemetry, keep potassium above 4, magnesium above 2  Stage 1- II sacral pressure ulcer: Wound care consulted, has allevyn dressings in place-daughter plans to buy some over the counter. Much improved prior to discharge  FTT; previously independent, driving, currently very weakwith fluctuating SBPs. Seen byPT- recommended d/c home with Anita Martinez PT/rolling walker.  Recommend palliative care evaluation as outpatient--explained to daughter. DNR  while here.     Discharge Exam:  Vitals:   02/08/20 0527 02/08/20 0914  BP: 107/71 (!) 104/48  Pulse: 72 85  Resp: 17 18  Temp: 98.8 F (37.1 C) (!) 97.1 F (36.2 C)  SpO2: 92% 95%   Vitals:   02/07/20 2041 02/07/20 2236 02/08/20 0527 02/08/20 0914  BP: (!) 110/57  107/71 (!) 104/48  Pulse: 80 85 72 85  Resp: 17  17 18   Temp: 99.1 F (37.3 C)  98.8 F (37.1 C) (!) 97.1 F (36.2 C)  TempSrc:    Oral  SpO2: 92% 97% 92% 95%  Weight:      Height:        General: Pt is alert, awake, not in acute distress Cardiovascular: RRR, S1/S2 +, no rubs, no gallops Respiratory: CTA bilaterally, no wheezing, no rhonchi Abdominal: Soft, NT, ND, bowel sounds + Extremities: no edema, no cyanosis  Discharge Condition:Stable CODE STATUS: DNR Diet recommendation: dysphagia 3 diet, low salt, low fat Recommendations for Outpatient Follow-up:  1. Follow up with PCP: 5 days 2. Follow up with consultants: Dr as scheduled in 5-7 days 3. Please obtain follow up labs including: CBC/BMP in 5-7 days  Home Health services upon discharge: [facility] PT Equipment/Devices upon discharge: walker   Discharge Instructions:  Discharge Instructions    AMB referral to CHF clinic   Complete by: As directed    Call MD for:   Complete by: As directed    Persistent low HR or low BP   Call MD for:  persistant nausea and vomiting   Complete by: As directed    Diet - low sodium heart healthy   Complete by: As directed    Increase activity slowly   Complete by: As directed      Allergies as of 02/08/2020      Reactions   Morphine And Related    Makes hyper and unable to sleep      Medication List    STOP taking these medications   amitriptyline 10 MG tablet Commonly known as: ELAVIL   cephALEXin 250 MG capsule Commonly known as: KEFLEX     TAKE these medications   albuterol 108 (90 Base) MCG/ACT inhaler  Commonly known as: VENTOLIN HFA Inhale 2 puffs into the lungs every 6 (six) hours as  needed for wheezing or shortness of breath.   Allevyn Adhesive Pads Apply 1 application topically daily.   atorvastatin 10 MG tablet Commonly known as: LIPITOR Take 10 mg by mouth daily.   butalbital-acetaminophen-caffeine 50-325-40 MG tablet Commonly known as: FIORICET Take 1 tablet by mouth 2 (two) times daily as needed for headache.   digoxin 0.125 MG tablet Commonly known as: LANOXIN Take 1 tablet (0.125 mg total) by mouth daily. Start taking on: Feb 09, 2020   feeding supplement (ENSURE ENLIVE) Liqd Take 237 mLs by mouth 2 (two) times daily between meals.   fish oil-omega-3 fatty acids 1000 MG capsule Take 1 g by mouth 2 (two) times daily.   ivabradine 7.5 MG Tabs tablet Commonly known as: CORLANOR Take 1 tablet (7.5 mg total) by mouth 2 (two) times daily with a meal.   LORazepam 0.5 MG tablet Commonly known as: ATIVAN Take 0.5 mg by mouth 3 (three) times daily as needed for anxiety.   metoprolol tartrate 25 MG tablet Commonly known as: LOPRESSOR Take 0.5 tablets (12.5 mg total) by mouth 2 (two) times daily. Hold for HR <60 AND SBP <90   multivitamin with minerals Tabs tablet Take 1 tablet by mouth daily. Start taking on: Feb 09, 2020   oxyCODONE-acetaminophen 5-325 MG tablet Commonly known as: PERCOCET/ROXICET Take 1 tablet by mouth every 6 (six) hours as needed for moderate pain or severe pain.   senna-docusate 8.6-50 MG tablet Commonly known as: Senokot-S Take 1 tablet by mouth 2 (two) times daily.   sildenafil 20 MG tablet Commonly known as: REVATIO Take 1 tablet (20 mg total) by mouth 3 (three) times daily. Hold SBP<100, DBP<40   torsemide 20 MG tablet Commonly known as: DEMADEX Take 1 tablet (20 mg total) by mouth daily. Start taking on: Feb 09, 2020            Durable Medical Equipment  (From admission, onward)         Start     Ordered   02/08/20 1056  DME Walker  Once    Question Answer Comment  Walker: With 5 Inch Wheels   Patient  needs a walker to treat with the following condition Ataxia   Patient needs a walker to treat with the following condition CHF (congestive heart failure), NYHA class II (HCC)      02/08/20 1059         Follow-up Information    Tolia, Sunit, DO Follow up on 02/13/2020.   Specialties: Cardiology, Radiology, Vascular Surgery Why: 2:45 PM appointment and bring all medications Contact information: [address] [place] [address] [phone]          Allergies  Allergen Reactions  . Morphine And Related     Makes hyper and unable to sleep      The results of significant diagnostics from this hospitalization (including imaging, microbiology, ancillary and laboratory) are listed below for reference.    Labs: BNP (last 3 results) Recent Labs    02/03/20 0214 02/04/20 0248 02/05/20 0318  BNP 1,815.1* 1,351.8* 1,570.1*   Basic Metabolic Panel: Recent Labs  Lab 02/03/20 0214 02/04/20 0248 02/05/20 0318 02/06/20 0234 02/07/20 0357  NA 134* 132* 132* 132* 135  K 2.5* 4.9 3.8 3.6 3.8  CL 94* 91* 92* 91* 94*  CO2 30 32 GLUCOSE 111* 111* 106* 111* 100*  BUN 35* 24* CREATININE 0.68 0.69 0.64 0.64 0.63  CALCIUM 7.9* 7.9* 7.7* 8.0* 8.3*  MG 1.7  --  2.1  --   --    Liver Function Tests: Recent Labs  Lab 02/03/20 0214 02/04/20 0248 02/05/20 0318 02/06/20 0234 02/07/20 0357  AST 274* 191* 112* 84* 80*  ALT 225* 175* 113* 89* 76*  ALKPHOS 106 103 91 94 91  BILITOT 1.6* 1.6* 1.7* 1.8* 1.7*  PROT 5.2* 5.7* 5.4* 5.6* 5.9*  ALBUMIN 2.3* 2.6* 2.3* 2.4* 2.5*   No results for input(s): LIPASE, AMYLASE in the last 168 hours. No results for input(s): AMMONIA in the last 168 hours. CBC: Recent Labs  Lab 02/03/20 0214 02/03/20 0916 02/04/20 0822 02/05/20 0318 02/06/20 0234  WBC 12.3* 13.3* 15.5* 14.4* 13.4*  NEUTROABS 10.3* 11.3* 12.4* 11.3* 10.4*  HGB 13.2 14.5 14.0 13.5 13.6  HCT 40.4 44.6 43.0 42.2 42.0  MCV 86.7 88.0 85.8  87.2 87.7  PLT 69* 75* 84* 90* 101*   Cardiac Enzymes: No results for input(s): CKTOTAL, CKMB, CKMBINDEX, TROPONINI in the last 168 hours. BNP: Invalid input(s): POCBNP CBG: No results for input(s): GLUCAP in the last 168 hours. D-Dimer No results for input(s): DDIMER in the last 72 hours. Hgb A1c No results for input(s): HGBA1C in the last 72 hours. Lipid Profile No results for input(s): CHOL, HDL, LDLCALC, TRIG, CHOLHDL, LDLDIRECT in the last 72 hours. Thyroid function studies No results for input(s): TSH, T4TOTAL, T3FREE, THYROIDAB in the last 72 hours.  Invalid input(s): FREET3 Anemia work up No results for input(s): VITAMINB12, FOLATE, FERRITIN, TIBC, IRON, RETICCTPCT in the last 72 hours. Urinalysis    Component Value Date/Time   COLORURINE YELLOW 01/31/2020 0637   APPEARANCEUR CLEAR 01/31/2020 0637   LABSPEC 1.013 01/31/2020 0637   PHURINE 5.0 01/31/2020 0637   GLUCOSEU NEGATIVE 01/31/2020 0637   HGBUR NEGATIVE 01/31/2020 0637   BILIRUBINUR NEGATIVE 01/31/2020 0637   KETONESUR NEGATIVE 01/31/2020 0637   PROTEINUR NEGATIVE 01/31/2020 0637   NITRITE NEGATIVE 01/31/2020 0637   LEUKOCYTESUR NEGATIVE 01/31/2020 1610   Sepsis Labs Invalid input(s): PROCALCITONIN,  WBC,  LACTICIDVEN Microbiology Recent Results (from the past 240 hour(s))  Blood culture (routine x 2)     Status: None   Collection Time: 01/30/20  1:36 PM   Specimen: BLOOD  Result Value Ref Range Status   Specimen Description   Final    BLOOD LEFT ANTECUBITAL Performed at [facility], [address], [place], [place] [address]    Special Requests   Final    BOTTLES DRAWN AEROBIC AND ANAEROBIC Blood Culture adequate volume Performed at [facility], [address] [place], [place] [address]    Culture   Final    NO GROWTH 5 DAYS Performed at [facility], [address] [place], [place] [address]    Report Status 02/04/2020 FINAL  Final  SARS  Coronavirus 2 by RT PCR (hospital order, performed in [facility] hospital lab) Nasopharyngeal Nasopharyngeal Swab     Status: None   Collection Time: 01/30/20  3:06 PM   Specimen: Nasopharyngeal Swab  Result Value Ref Range Status   SARS Coronavirus 2 NEGATIVE NEGATIVE Final    Comment: (NOTE) SARS-CoV-2 target nucleic acids are NOT DETECTED. The SARS-CoV-2 RNA is generally detectable in upper and lower respiratory specimens during the acute phase of infection. The lowest concentration of SARS-CoV-2 viral copies this assay can detect is 250 copies / mL.  A negative result does not preclude SARS-CoV-2 infection and should not be used as the sole basis for treatment or other patient management decisions.  A negative result may occur with improper specimen collection / handling, submission of specimen other than nasopharyngeal swab, presence of viral mutation(s) within the areas targeted by this assay, and inadequate number of viral copies (<250 copies / mL). A negative result must be combined with clinical observations, patient history, and epidemiological information. Fact Sheet for Patients:   BoilerBrush.com.cy Fact Sheet for Healthcare Providers: https://pope.com/ This test is not yet approved or cleared  by the [place] FDA and has been authorized for detection and/or diagnosis of SARS-CoV-2 by FDA under an Emergency Use Authorization (EUA).  This EUA will remain in effect (meaning this test can be used) for the duration of the COVID-19 declaration under Section 564(b)(1) of the Act, 21 U.S.C. section 360bbb-3(b)(1), unless the authorization is terminated or revoked sooner. Performed at [facility], [address], [place], [place] [address]   Urine culture     Status: None   Collection Time: 01/31/20  6:37 AM   Specimen: Urine, Catheterized  Result Value Ref Range Status   Specimen Description   Final     URINE, CATHETERIZED Performed at [facility], [address] [place], [place] [address]    Special Requests   Final    NONE Performed at [facility], [address] [place], [place] [address]    Culture   Final    NO GROWTH Performed at [facility], [address], [place], [place] [address]    Report Status 02/01/2020 FINAL  Final  MRSA PCR Screening     Status: None   Collection Time: 01/31/20 12:27 PM   Specimen: Nasal Mucosa; Nasopharyngeal  Result Value Ref Range Status   MRSA by PCR NEGATIVE NEGATIVE Final    Comment:        The GeneXpert MRSA Assay (FDA approved for NASAL specimens only), is one component of a comprehensive MRSA colonization surveillance program. It is not intended to diagnose MRSA infection nor to guide or monitor treatment for MRSA infections. Performed at [facility], [address], [place], [place] [address]     Procedures/Studies: DG Chest 2 View  Result Date: 01/27/2020 CLINICAL DATA:  Tachycardia EXAM: CHEST - 2 VIEW COMPARISON:  April 03, 2016 FINDINGS: The lung volumes are low. There are new small bilateral pleural effusions, left greater than right. There is an airspace opacity at the left lung base. There are prominent interstitial lung markings. There is a nodular density projecting over the anterior right first rib which appears more conspicuous on today's exam. There is some pleuroparenchymal scarring at the lung apices. There is no pneumothorax. There is osteopenia. IMPRESSION: 1. New small bilateral pleural effusions, left greater than right. 2. New airspace opacity at the left lung base may represent atelectasis or infiltrate. 3. Vascular congestion. 4. Questionable pulmonary nodule overlying the anterior right first rib. Consider a 4-6 week follow-up two-view chest x-ray to confirm stability or resolution of this finding. Electronically Signed   By: Katherine Mantle M.D.    On: 01/27/2020 16:03   CT Head Wo Contrast  Result Date: 01/30/2020 CLINICAL DATA:  Generalized weakness. EXAM: CT HEAD WITHOUT CONTRAST TECHNIQUE: Contiguous axial images were obtained from the base of the skull through the vertex without intravenous contrast. COMPARISON:  Jan 27, 2020 FINDINGS: Brain: There is mild cerebral atrophy  with widening of the extra-axial spaces and ventricular dilatation. There are areas of decreased attenuation within the white matter tracts of the supratentorial brain, consistent with microvascular disease changes. Vascular: No hyperdense vessel or unexpected calcification. Skull: Normal. Negative for fracture or focal lesion. Sinuses/Orbits: No acute finding. Other: None. IMPRESSION: 1. Generalized cerebral atrophy. 2. No acute intracranial abnormality. Electronically Signed   By: Virgina Norfolk M.D.   On: 01/30/2020 15:37   CT Head Wo Contrast  Result Date: 01/27/2020 CLINICAL DATA:  Fatigue, dizziness, weakness EXAM: CT HEAD WITHOUT CONTRAST TECHNIQUE: Contiguous axial images were obtained from the base of the skull through the vertex without intravenous contrast. COMPARISON:  05/25/2006 FINDINGS: Brain: No evidence of acute infarction, hemorrhage, hydrocephalus, extra-axial collection or mass lesion/mass effect. Scattered low-density changes within the periventricular and subcortical white matter compatible with chronic microvascular ischemic change. Mild diffuse cerebral volume loss. Vascular: Mild atherosclerotic calcifications involving the large vessels of the skull base. No unexpected hyperdense vessel. Skull: Normal. Negative for fracture or focal lesion. Sinuses/Orbits: No acute finding. Other: None. IMPRESSION: 1.  No acute intracranial findings. 2.  Chronic microvascular ischemic change and cerebral volume loss. Electronically Signed   By: Davina Poke D.O.   On: 01/27/2020 15:57   CT Angio Chest PE W and/or Wo Contrast  Result Date: 01/27/2020 CLINICAL  DATA:  Shortness of breath, positive D dimer EXAM: CT ANGIOGRAPHY CHEST WITH CONTRAST TECHNIQUE: Multidetector CT imaging of the chest was performed using the standard protocol during bolus administration of intravenous contrast. Multiplanar CT image reconstructions and MIPs were obtained to evaluate the vascular anatomy. CONTRAST:  46mL OMNIPAQUE IOHEXOL 350 MG/ML SOLN COMPARISON:  01/27/2020 FINDINGS: Cardiovascular: This is a technically adequate evaluation of the pulmonary vasculature. There are no filling defects or pulmonary emboli. Heart is mildly enlarged, with prominent biatrial dilatation, right greater than left. Reflux of contrast into the hepatic veins suggest a degree of cardiac dysfunction. Thoracic aorta demonstrates normal caliber, with extensive atherosclerosis. Mediastinum/Nodes: No enlarged mediastinal, hilar, or axillary lymph nodes. Thyroid gland, trachea, and esophagus demonstrate no significant findings. Lungs/Pleura: Moderate bilateral pleural effusions, less than 2 L each. There is compressive atelectasis at the lung bases. Scattered ground-glass airspace disease with background interlobular septal thickening suggests pulmonary edema. No pneumothorax. Central airways are patent. Upper Abdomen: No acute abnormality. Musculoskeletal: No acute or destructive bony lesions. Reconstructed images demonstrate no additional findings. Review of the MIP images confirms the above findings. IMPRESSION: 1. No evidence of pulmonary embolus. 2. Findings consistent with congestive heart failure, with moderate bilateral pleural effusions and scattered ground-glass airspace disease. 3. Nodularity described over the right anterior first rib on chest x-ray corresponds to patchy airspace disease and confluence of shadows based on CT findings. No underlying pulmonary nodule or mass. 4. Aortic Atherosclerosis (ICD10-I70.0). Electronically Signed   By: Randa Ngo M.D.   On: 01/27/2020 19:02   US  RENAL  Result Date: 01/31/2020 CLINICAL DATA:  Acute kidney injury.  History of bladder suspension EXAM: RENAL / URINARY TRACT ULTRASOUND COMPLETE COMPARISON:  None. FINDINGS: Right Kidney: Renal measurements: 8.7 x 3.5 x 5.1 cm = volume: 81 mL . Echogenicity within normal limits. No mass or hydronephrosis visualized. Left Kidney: Renal measurements: 9.3 x 4.2 x 4.0 cm = volume: 81.1 mL. Echogenicity within normal limits. No mass. Pelvocaliectasis noted. Bladder: Contains of Foley catheter.  Otherwise unremarkable. Other: Bilateral pleural effusions noted.  Gallbladder sludge present. IMPRESSION: 1. Small bilateral kidneys. No evidence for high-grade obstructive uropathy. 2. Left  pelvocaliectasis. Electronically Signed   By: Signa Kell M.D.   On: 01/31/2020 12:00   DG CHEST PORT 1 VIEW  Result Date: 01/31/2020 CLINICAL DATA:  Follow-up pneumonia EXAM: PORTABLE CHEST 1 VIEW COMPARISON:  01/30/2019 FINDINGS: Cardiomegaly, vascular congestion. Improving bilateral airspace disease. Continued bilateral lower lobe consolidation, left greater than right. No visible significant effusions or pneumothorax. IMPRESSION: Improving bilateral airspace disease with residual bibasilar opacities. Electronically Signed   By: Charlett Nose M.D.   On: 01/31/2020 13:14   DG Chest Portable 1 View  Result Date: 01/30/2020 CLINICAL DATA:  Shortness of breath EXAM: PORTABLE CHEST 1 VIEW COMPARISON:  01/27/2020 FINDINGS: Stable cardiomegaly. Atherosclerotic calcification of the aortic knob. Small bilateral pleural effusions with associated bibasilar opacities. Bilateral interstitial prominence suggesting mild edema. No pneumothorax is seen. IMPRESSION: Findings suggesting CHF with mild edema and bilateral pleural effusions. Electronically Signed   By: Duanne Guess D.O.   On: 01/30/2020 14:23   ECHOCARDIOGRAM COMPLETE  Result Date: 01/31/2020    ECHOCARDIOGRAM REPORT   Patient Name:   SANTOYA HEMING [facility] Date of Exam:  01/31/2020 Medical Rec #:  [id number]       Height:       65.0 in Accession #:    [id number]      Weight:       103.0 lb Date of Birth:  1933-07-29       BSA:          1.492 m Patient Age:    87 years        BP:           84/32 mmHg Patient Gender: F               HR:           111 bpm. Exam Location:  Inpatient Procedure: 2D Echo STAT ECHO Indications:     acute diastolic chf 428.31  History:         Patient has no prior history of Echocardiogram examinations.                  Risk Factors:Dyslipidemia.  Sonographer:     Delcie Roch RDCS Referring Phys:  [id number] SAMTANI Diagnosing Phys: Yates Decamp MD IMPRESSIONS  1. Left ventricular ejection fraction, by estimation, is <20%. Left ventricular ejection fraction by PLAX is 23 %. The left ventricle has severely decreased function. The left ventricle demonstrates global hypokinesis. Left ventricular diastolic function could not be evaluated.  2. Right ventricular systolic function is severely reduced. The right ventricular size is moderately enlarged. There is mildly elevated pulmonary artery systolic pressure. The estimated right ventricular systolic pressure is 36.5 mmHg.  3. Left atrial size was severely dilated.  4. Right atrial size was severely dilated.  5. Moderate pleural effusion in both left and right lateral regions.  6. The mitral valve is grossly normal. Severe mitral valve regurgitation.  7. Tricuspid valve regurgitation is moderate to severe.  8. The aortic valve is normal in structure. Aortic valve regurgitation is not visualized. No aortic stenosis is present. FINDINGS  Left Ventricle: Left ventricular ejection fraction, by estimation, is <20%. Left ventricular ejection fraction by PLAX is 23 %. The left ventricle has severely decreased function. The left ventricle demonstrates global hypokinesis. The left ventricular internal cavity size was normal in size. There is no left ventricular hypertrophy. Left ventricular diastolic function could  not be evaluated. Right Ventricle: The right ventricular size is moderately enlarged. No increase in right ventricular  wall thickness. Right ventricular systolic function is severely reduced. There is mildly elevated pulmonary artery systolic pressure. The tricuspid regurgitant velocity is 2.32 m/s, and with an assumed right atrial pressure of 15 mmHg, the estimated right ventricular systolic pressure is 36.5 mmHg. Left Atrium: Left atrial size was severely dilated. Right Atrium: Right atrial size was severely dilated. Pericardium: There is no evidence of pericardial effusion. Mitral Valve: The mitral valve is grossly normal. Mild mitral annular calcification. Severe mitral valve regurgitation. MV peak gradient, 87.6 mmHg. The mean mitral valve gradient is 51.0 mmHg. Tricuspid Valve: The tricuspid valve is grossly normal. Tricuspid valve regurgitation is moderate to severe. Aortic Valve: The aortic valve is normal in structure. Aortic valve regurgitation is not visualized. No aortic stenosis is present. Pulmonic Valve: The pulmonic valve was normal in structure. Pulmonic valve regurgitation is trivial. Aorta: The aortic root is normal in size and structure. IAS/Shunts: No atrial level shunt detected by color flow Doppler. Additional Comments: There is a moderate pleural effusion in both left and right lateral regions.  LEFT VENTRICLE PLAX 2D LV EF:         Left ventricular ejection fraction by PLAX is 23 %. LVIDd:         4.96 cm LVIDs:         4.43 cm LV PW:         0.68 cm LV IVS:        0.72 cm LVOT diam:     1.70 cm LV SV:         11 LV SV Index:   7 LVOT Area:     2.27 cm  RIGHT VENTRICLE            IVC RV S prime:     5.37 cm/s  IVC diam: 2.41 cm TAPSE (M-mode): 1.0 cm LEFT ATRIUM             Index       RIGHT ATRIUM           Index LA diam:        3.70 cm 2.48 cm/m  RA Area:     21.60 cm LA Vol (A2C):   79.9 ml 53.55 ml/m RA Volume:   67.00 ml  44.91 ml/m LA Vol (A4C):   72.4 ml 48.53 ml/m LA Biplane  Vol: 77.5 ml 51.94 ml/m  AORTIC VALVE LVOT Vmax:   43.30 cm/s LVOT Vmean:  28.400 cm/s LVOT VTI:    0.048 m  AORTA Ao Root diam: 2.40 cm MITRAL VALVE             TRICUSPID VALVE MV Peak grad: 87.6 mmHg  TR Peak grad:   21.5 mmHg MV Mean grad: 51.0 mmHg  TR Vmax:        232.00 cm/s MV Vmax:      4.68 m/s MV Vmean:     335.0 cm/s SHUNTS MR PISA:        1.57 cm Systemic VTI:  0.05 m MR PISA Radius: 0.50 cm  Systemic Diam: 1.70 cm Yates Decamp MD Electronically signed by Yates Decamp MD Signature Date/Time: 01/31/2020/8:01:54 PM    Final    US Abdomen Limited RUQ  Result Date: 01/30/2020 CLINICAL DATA:  Abnormal LFTs EXAM: ULTRASOUND ABDOMEN LIMITED RIGHT UPPER QUADRANT COMPARISON:  None. FINDINGS: Gallbladder: Gallbladder is well distended with gallbladder sludge within. No definitive cholelithiasis is seen. No wall thickening or pericholecystic fluid is noted. Common bile duct: Diameter: 8 mm which is within normal  limits for the patient's given age. Liver: No focal lesion identified. Within normal limits in parenchymal echogenicity. Portal vein is patent on color Doppler imaging with normal direction of blood flow towards the liver. Other: Small right pleural effusion is noted. IMPRESSION: Gallbladder sludge without cholelithiasis. Small right pleural effusion. Electronically Signed   By: Alcide Clever M.D.   On: 01/30/2020 18:47    Time coordinating discharge: Over 30 minutes  SIGNED:   Alessandra Bevels, MD  [facility] 02/08/2020, 11:21 AM

## 2020-02-08 NOTE — Progress Notes (Signed)
I have restarted Revatio for RV failure. COntinue digoxin and Low dose Metoprolol and I will see her 5-10 days post discharge. Call me if questions.  Yates Decamp, MD, [facility] 02/08/2020, 7:06 AM [facility] [place] Office: [phone]  [phone]

## 2020-02-08 NOTE — Progress Notes (Signed)
Nutrition Follow-up  DOCUMENTATION CODES:   Underweight  INTERVENTION:  - continue Ensure Enlive BID.   NUTRITION DIAGNOSIS:   Inadequate oral intake related to poor appetite as evidenced by meal completion < 50%. -ongoing  GOAL:   Patient will meet greater than or equal to 90% of their needs -unmet  MONITOR:   Supplement acceptance, PO intake, Labs, Weight trends, Skin, I & O's  ASSESSMENT:   86 year old female, lives alone, very independent, drove herself around until a week ago, PMH of extreme hard of hearing, unwilling to use hearing aids, chronic back pain, opioid dependent, anxiety and?  Depression, hyperlipidemia, presented to the [facility] ED with above complaints. Admitted for acute CHF.  Patient continues with 0-25% intakes of meals. She has refused nearly all packets of Juven and has accepted Ensure Enlive 25-50% of the time offered.   Discharge order and discharge summary entered late this AM for discharge to home.   Labs reviewed; Cl: 94 mmol/l, Ca: 8.3 mg/dl, LFTs elevated but trending down. Medications reviewed; 1 tablet multivitamin with minerals/day, 1 tablet senokot BID.    NUTRITION - FOCUSED PHYSICAL EXAM:  unable to complete at this time.   Diet Order:   Diet Order            Diet - low sodium heart healthy        DIET DYS 3 Room service appropriate? No; Fluid consistency: Thin  Diet effective now              EDUCATION NEEDS:   No education needs have been identified at this time  Skin:  Skin Assessment: Skin Integrity Issues: Skin Integrity Issues:: Stage I, Stage II Stage I: mid, upper back; sacrum Stage II: sacrum  Last BM:  5/18  Height:   Ht Readings from Last 1 Encounters:  01/31/20 5\' 5"  (1.651 m)    Weight:   Wt Readings from Last 1 Encounters:  02/07/20 47.1 kg    Ideal Body Weight:  56.8 kg  BMI:  Body mass index is 17.28 kg/m.  Estimated Nutritional Needs:   Kcal:  1400-1600  Protein:   70-80g  Fluid:  1.5L/day     02/09/20, MS, RD, LDN, CNSC Inpatient Clinical Dietitian RD pager # available in [facility]  After hours/weekend pager # available in [facility]

## 2020-02-08 NOTE — TOC Progression Note (Signed)
Transition of Care [facility]) - Progression Note    Patient Details  Name: Anita Martinez MRN: [id number] Date of Birth: Jun 06, 1933  [facility]) CM/SW Contact  Geni Bers, RN Phone Number: 02/08/2020, 1:24 PM  Clinical Narrative:    Spoke with pt's daughter pt will discharge home and need RW. 4-wheeled walker ordered from [employer]. Daughter Gavin Pound at bedside states the pt has appointment with PCP Monday 02/13/20. Gavin Pound states she want to wait to see pt's PCP and will ask  about HHPT at that time.  Expected Discharge Plan: Skilled Nursing Facility Barriers to Discharge: Continued Medical Work up  Expected Discharge Plan and Services Expected Discharge Plan: Skilled Nursing Facility   Discharge Planning Services: CM Consult   Living arrangements for the past 2 months: Single Family Home Expected Discharge Date: 02/08/20                                     Social Determinants of Health (SDOH) Interventions    Readmission Risk Interventions No flowsheet data found.

## 2020-02-09 ENCOUNTER — Telehealth: Payer: Self-pay

## 2020-02-09 NOTE — Telephone Encounter (Signed)
Spoke with Daughter about Corlanor pre authorization. Sample put up front for daughter to pick up. Instructions Take one and one half tablet twice daily to equal 7.5mg  BID. Daughter verbalized understanding.

## 2020-02-09 NOTE — Telephone Encounter (Signed)
Location of hospitalization: [facility] Reason for hospitalization: Heart failure Date of discharge: 02/08/2020 Date of first communication with patient: today Person contacting patient: Me Current symptoms: none Do you understand why you were in the Hospital: Yes Questions regarding discharge instructions: None Where were you discharged to: Home Medications reviewed: Yes Allergies reviewed: Yes Dietary changes reviewed: Yes. Discussed low fat and low salt diet.  Referals reviewed: NA Activities of Daily Living: Able to with mild limitations Any transportation issues/concerns: None Any patient concerns: Can not afford Corlanor  Confirmed importance & date/time of Follow up appt: Yes Confirmed with patient if condition begins to worsen call. Pt was given the office number and encouraged to call back with questions or concerns: Yes

## 2020-02-13 ENCOUNTER — Other Ambulatory Visit: Payer: Self-pay

## 2020-02-13 ENCOUNTER — Ambulatory Visit: Payer: Medicare Other | Admitting: Cardiology

## 2020-02-13 ENCOUNTER — Encounter: Payer: Self-pay | Admitting: Cardiology

## 2020-02-13 VITALS — BP 106/58 | HR 61 | Ht 65.0 in | Wt 101.0 lb

## 2020-02-13 DIAGNOSIS — Z87891 Personal history of nicotine dependence: Secondary | ICD-10-CM

## 2020-02-13 DIAGNOSIS — I5082 Biventricular heart failure: Secondary | ICD-10-CM

## 2020-02-13 DIAGNOSIS — E782 Mixed hyperlipidemia: Secondary | ICD-10-CM

## 2020-02-13 DIAGNOSIS — I429 Cardiomyopathy, unspecified: Secondary | ICD-10-CM

## 2020-02-13 MED ORDER — SILDENAFIL CITRATE 20 MG PO TABS: 20.0000 mg | ORAL_TABLET | Freq: Three times a day (TID) | ORAL | 0 refills | Status: DC

## 2020-02-13 NOTE — Progress Notes (Signed)
[name] Date of Birth: [birth year] MRN: [id number] Primary Care Provider:Varadarajan, Soyla Murphy  Date: 02/13/2020  Chief Complaint  Patient presents with  . Congestive Heart Failure    hospital follow up  . Follow-up    pt c/o back pain    HPI  Anita Martinez is a 86 y.o.  female who presents to the office with a chief complaint of " congestive heart failure management and post hospitalization." Patient's past medical history and cardiovascular risk factors include: Newly diagnosed heart failure with reduced EF, cardiomyopathy, severe mitral regurgitation, moderate to severe TR, hyperlipidemia, chronic back pain, anxiety, advanced age, postmenopausal female.  Patient is accompanied by her daughter, Eunice Blase, at today's office visit.    Patient presents to the office after her recent hospitalization at [facility] from May 10-Feb 08, 2020.  Patient originally presented to ED on Jan 27, 2020 complaining of generalized weakness with ambulation and effort related dyspnea.  CT was negative for pulmonary embolism but clinically there was concern for new onset of congestive heart failure.  Patient was recommended to be admitted at that time for further evaluation.  However, she left AMA.  Per her daughter she deteriorated area presented to the hospital for further evaluation.  During her recent hospitalization she was treated for acute decompensated biventricular heart failure, hypotension, acute kidney injury, Klebsiella UTI.  Cardiology was consulted for management of acute decompensated biventricular heart failure.   Echocardiogram noted an LVEF of less than 20% with severely reduced global hypokinesis.  Right ventricular function size moderately enlarged and systolic function severely reduced.  Pulmonary hypertension with RVSP of 35 mmHg.  Biatrial dilatation.  Severe mitral regurgitation and moderate to severe tricuspid regurgitation.   In the setting of biventricular heart  failure, patient had hypotension, tachycardia, lactic acidosis, abnormal LFTs due to passive hepatic congestion, acute kidney injury, elderly frail female goals of care were discussed with patient and her daughter. Shared decision was to proceed with medical management and hold off invasive cardiovascular procedures given her presentation, frailty, chronic comorbidities, and age.   Medications were uptitrated during her recent hospitalization.  Due to hypotension patient was unable to tolerate Entresto.  She was diuresed well during her recent hospitalization and transition to oral torsemide.  She was also started on Revatio and metoprolol by Dr.Ganji.  Ivabradine was also started secondary to underlying tachycardia.  Since discharge patient has been doing well.  Her weight has remained stable approximately 101-103 pounds.  She does not have any heart failure symptoms according to the patient's daughter.  Patient is compliant with her current medical regimen.   ALLERGIES: Allergies  Allergen Reactions  . Morphine And Related     Makes hyper and unable to sleep     MEDICATION LIST PRIOR TO VISIT: Current Outpatient Medications on File Prior to Visit  Medication Sig Dispense Refill  . atorvastatin (LIPITOR) 10 MG tablet Take 10 mg by mouth daily.    . digoxin (LANOXIN) 0.125 MG tablet Take 1 tablet (0.125 mg total) by mouth daily. 30 tablet 1  . feeding supplement, ENSURE ENLIVE, (ENSURE ENLIVE) LIQD Take 237 mLs by mouth 2 (two) times daily between meals. 237 mL 12  . fish oil-omega-3 fatty acids 1000 MG capsule Take 1 g by mouth 2 (two) times daily.    . ivabradine (CORLANOR) 7.5 MG TABS tablet Take 1 tablet (7.5 mg total) by mouth 2 (two) times daily with a meal. 60 tablet 1  . LORazepam (ATIVAN) 0.5  MG tablet Take 0.5 mg by mouth 3 (three) times daily as needed for anxiety.     . metoprolol tartrate (LOPRESSOR) 25 MG tablet Take 0.5 tablets (12.5 mg total) by mouth 2 (two) times daily. Hold  for HR <60 AND SBP <90 30 tablet 2  . Multiple Vitamin (MULTIVITAMIN WITH MINERALS) TABS tablet Take 1 tablet by mouth daily. 30 tablet 1  . oxyCODONE-acetaminophen (PERCOCET/ROXICET) 5-325 MG tablet Take 1 tablet by mouth every 6 (six) hours as needed for moderate pain or severe pain.     Marland Kitchen senna-docusate (SENOKOT-S) 8.6-50 MG tablet Take 1 tablet by mouth 2 (two) times daily. 30 tablet 1  . torsemide (DEMADEX) 20 MG tablet Take 1 tablet (20 mg total) by mouth daily. 30 tablet 1  . Wound Dressings (ALLEVYN ADHESIVE) PADS Apply 1 application topically daily.    Marland Kitchen albuterol (VENTOLIN HFA) 108 (90 Base) MCG/ACT inhaler Inhale 2 puffs into the lungs every 6 (six) hours as needed for wheezing or shortness of breath. 6.7 g 1  . butalbital-acetaminophen-caffeine (FIORICET, ESGIC) 50-325-40 MG per tablet Take 1 tablet by mouth 2 (two) times daily as needed for headache.     No current facility-administered medications on file prior to visit.    PAST MEDICAL HISTORY: Past Medical History:  Diagnosis Date  . Cardiomyopathy (HCC)   . CHF (congestive heart failure) (HCC)   . Chronic back pain   . Colon polyps   . Colonic diverticular abscess   . MR (mitral regurgitation)   . TR (tricuspid regurgitation)     PAST SURGICAL HISTORY: Past Surgical History:  Procedure Laterality Date  . ABDOMINAL HYSTERECTOMY    . BLADDER SUSPENSION      FAMILY HISTORY: The patient's family history is not on file.   SOCIAL HISTORY:  The patient  reports that she quit smoking about 51 years ago. She started smoking about 65 years ago. She has a 14.00 pack-year smoking history. She has never used smokeless tobacco. She reports that she does not drink alcohol or use drugs.  Review of Systems  Constitution: Positive for malaise/fatigue. Negative for chills and fever.  HENT: Positive for hearing loss. Negative for hoarse voice and nosebleeds.   Eyes: Negative for discharge, double vision and pain.   Cardiovascular: Negative for chest pain, claudication, dyspnea on exertion, leg swelling, near-syncope, orthopnea, palpitations, paroxysmal nocturnal dyspnea and syncope.  Respiratory: Positive for shortness of breath. Negative for hemoptysis.   Musculoskeletal: Negative for muscle cramps and myalgias.  Gastrointestinal: Negative for abdominal pain, constipation, diarrhea, hematemesis, hematochezia, melena, nausea and vomiting.  Neurological: Negative for dizziness and light-headedness.    PHYSICAL EXAM: Vitals with BMI 02/13/2020 02/08/2020 02/08/2020  Height 5\' 5"  - -  Weight 101 lbs - -  BMI 16.81 - -  Systolic 106 105  Diastolic 58 51 48  Pulse 61 87 85    CONSTITUTIONAL: Frail-appearing elderly female.  No acute distress, hemodynamically stable.    SKIN: Skin is warm and dry. No rash noted. No cyanosis. No pallor. No jaundice HEAD: Normocephalic and atraumatic.  EYES: No scleral icterus MOUTH/THROAT: Moist oral membranes.  NECK: No JVD present. No thyromegaly noted. No carotid bruits  LYMPHATIC: No visible cervical adenopathy.  CHEST Normal respiratory effort. No intercostal retractions  LUNGS: Decreased breath sounds at the bases.  No stridor. No wheezes. No rales.  CARDIOVASCULAR: Regular, positive S1-S2, holosystolic murmur heard at the apex, no gallops or rubs ABDOMINAL: No apparent ascites, abdominal hernia.  EXTREMITIES: No  peripheral edema   HEMATOLOGIC: No significant bruising NEUROLOGIC: Oriented to person, place, and time. Nonfocal. Normal muscle tone.  PSYCHIATRIC: Normal mood and affect. Normal behavior. Cooperative  CARDIAC DATABASE: EKG: 02/06/2020: Sinus rhythm with 97 bpm, with sinus arrhythmia and premature supraventricular complexes, LVH per voltage criteria, possible old septal infarct, nonspecific ST-T changes, without underlying injury pattern.  Echocardiogram: 01/31/2020: LVEF less than 20%, severely reduced left ventricular systolic function, global  hypokinesis, unable to quantify diastolic. Right ventricular systolic function is severely reduced. The right ventricular size is moderately enlarged. There is mildly elevated pulmonary artery systolic pressure. The estimated right ventricular systolic pressure is 19.4 mmHg. Left atrial size was severely dilated. Right atrial size was severely dilated. Moderate pleural effusion in both left and right lateral regions. The mitral valve is grossly normal. Severe mitral valve regurgitation. Tricuspid valve regurgitation is moderate to severe. The aortic valve is normal in structure. Aortic valve regurgitation is not visualized. No aortic stenosis is present.  LABORATORY DATA: CBC Latest Ref Rng & Units 02/06/2020 02/05/2020 02/04/2020  WBC 4.0 - 10.5 K/uL 13.4(H) 14.4(H) 15.5(H)  Hemoglobin 12.0 - 15.0 g/dL 13.6 13.5 14.0  Hematocrit 36.0 - 46.0 % 42.0 42.2 43.0  Platelets 150 - 400 K/uL 101(L) 90(L) 84(L)    CMP Latest Ref Rng & Units 02/13/2020 02/07/2020 02/06/2020  Glucose 65 - 99 mg/dL 111(H) 100(H) 111(H)  BUN 8 - 27 mg/dL 24 19 21   Creatinine 0.57 - 1.00 mg/dL 0.72 0.63 0.64  Sodium 134 - 144 mmol/L 141 135 132(L)  Potassium 3.5 - 5.2 mmol/L 3.8 3.8 3.6  Chloride 96 - 106 mmol/L 95(L) 94(L) 91(L)  CO2 20 - 29 mmol/L 32(H) 31 31  Calcium 8.7 - 10.3 mg/dL 9.0 8.3(L) 8.0(L)  Total Protein 6.5 - 8.1 g/dL - 5.9(L) 5.6(L)  Total Bilirubin 0.3 - 1.2 mg/dL - 1.7(H) 1.8(H)  Alkaline Phos 38 - 126 U/L - 91 94  AST 15 - 41 U/L - 80(H) 84(H)  ALT 0 - 44 U/L - 76(H) 89(H)    Lipid Panel  No results found for: CHOL, TRIG, HDL, CHOLHDL, VLDL, LDLCALC, LDLDIRECT, LABVLDL  No results found for: HGBA1C No components found for: NTPROBNP Lab Results  Component Value Date   TSH 1.554 01/30/2020    Cardiac Panel (last 3 results) No results for input(s): CKTOTAL, CKMB, TROPONINIHS, RELINDX in the last 72 hours.  IMPRESSION:    ICD-10-CM   1. Biventricular heart failure, NYHA class 3 (HCC)  R74.08  Basic metabolic panel    Magnesium    Magnesium    Basic metabolic panel    B Nat Peptide    Basic metabolic panel    Magnesium    Pro b natriuretic peptide (BNP)    DISCONTINUED: sildenafil (REVATIO) 20 MG tablet    DISCONTINUED: sildenafil (REVATIO) 20 MG tablet  2. Cardiomyopathy, unspecified type ([place])  I42.9   3. Mixed hyperlipidemia  E78.2   4. Former smoker  Z87.891      RECOMMENDATIONS: QUETZALI HEINLE is a 86 y.o. female whose past medical history and cardiovascular risk factors include: biventricular heart failure, stage C, NYHA class II/III, cardiomyopathy, mixed hyperlipidemia, former smoker, advanced age, postmenopausal female.  Biventricular heart failure, stage C, NYHA class II/III:  Overall improving since hospitalization.  Medications reconciled.  Samples for Corlanor provided.  Refilled Revatio  Check BMP, magnesium level, BNP  We will hold off uptitrating guideline directed medical therapy for now until blood work is reviewed.  Recommend daily  weight check, strict I/O's  Fluid restriction to <2L per day, Na restriction < 2g per day  Repeat echocardiogram around May 02, 2020 to reevaluate LV function.  Mixed lipidemia: Continue statin therapy.  Most recent lipid profile reviewed patient does not endorse myalgias.  Former smoker: Educated on importance of continued smoking cessation.  FINAL MEDICATION LIST END OF ENCOUNTER:  Current Outpatient Medications:  .  atorvastatin (LIPITOR) 10 MG tablet, Take 10 mg by mouth daily., Disp: , Rfl:  .  digoxin (LANOXIN) 0.125 MG tablet, Take 1 tablet (0.125 mg total) by mouth daily., Disp: 30 tablet, Rfl: 1 .  feeding supplement, ENSURE ENLIVE, (ENSURE ENLIVE) LIQD, Take 237 mLs by mouth 2 (two) times daily between meals., Disp: 237 mL, Rfl: 12 .  fish oil-omega-3 fatty acids 1000 MG capsule, Take 1 g by mouth 2 (two) times daily., Disp: , Rfl:  .  ivabradine (CORLANOR) 7.5 MG TABS tablet, Take 1 tablet (7.5  mg total) by mouth 2 (two) times daily with a meal., Disp: 60 tablet, Rfl: 1 .  LORazepam (ATIVAN) 0.5 MG tablet, Take 0.5 mg by mouth 3 (three) times daily as needed for anxiety. , Disp: , Rfl:  .  metoprolol tartrate (LOPRESSOR) 25 MG tablet, Take 0.5 tablets (12.5 mg total) by mouth 2 (two) times daily. Hold for HR <60 AND SBP <90, Disp: 30 tablet, Rfl: 2 .  Multiple Vitamin (MULTIVITAMIN WITH MINERALS) TABS tablet, Take 1 tablet by mouth daily., Disp: 30 tablet, Rfl: 1 .  oxyCODONE-acetaminophen (PERCOCET/ROXICET) 5-325 MG tablet, Take 1 tablet by mouth every 6 (six) hours as needed for moderate pain or severe pain. , Disp: , Rfl:  .  senna-docusate (SENOKOT-S) 8.6-50 MG tablet, Take 1 tablet by mouth 2 (two) times daily., Disp: 30 tablet, Rfl: 1 .  torsemide (DEMADEX) 20 MG tablet, Take 1 tablet (20 mg total) by mouth daily., Disp: 30 tablet, Rfl: 1 .  Wound Dressings (ALLEVYN ADHESIVE) PADS, Apply 1 application topically daily., Disp: , Rfl:  .  albuterol (VENTOLIN HFA) 108 (90 Base) MCG/ACT inhaler, Inhale 2 puffs into the lungs every 6 (six) hours as needed for wheezing or shortness of breath., Disp: 6.7 g, Rfl: 1 .  butalbital-acetaminophen-caffeine (FIORICET, ESGIC) 50-325-40 MG per tablet, Take 1 tablet by mouth 2 (two) times daily as needed for headache., Disp: , Rfl:  .  sildenafil (REVATIO) 20 MG tablet, Take 1 tablet (20 mg total) by mouth 3 (three) times daily. Hold SBP<100, DBP<40, Disp: 90 tablet, Rfl: 3  Orders Placed This Encounter  Procedures  . Basic metabolic panel  . Magnesium  . B Nat Peptide  . Basic metabolic panel  . Magnesium  . Pro b natriuretic peptide (BNP)   --Continue cardiac medications as reconciled in final medication list. --Return in about 4 weeks (around 03/12/2020) for heart failure management.. Or sooner if needed. --Continue follow-up with your primary care physician regarding the management of your other chronic comorbid conditions.  Patient's  questions and concerns were addressed to her satisfaction. She voices understanding of the instructions provided during this encounter.   This note was created using a voice recognition software as a result there may be grammatical errors inadvertently enclosed that do not reflect the nature of this encounter. Every attempt is made to correct such errors.  Tessa Lerner, [place], [facility]  Pager: [phone] Office: [phone]

## 2020-02-14 ENCOUNTER — Other Ambulatory Visit: Payer: Self-pay

## 2020-02-14 DIAGNOSIS — I5082 Biventricular heart failure: Secondary | ICD-10-CM

## 2020-02-14 LAB — BASIC METABOLIC PANEL
BUN/Creatinine Ratio: 33 — ABNORMAL HIGH (ref 12–28)
BUN: 24 mg/dL (ref 8–27)
CO2: 32 mmol/L — ABNORMAL HIGH (ref 20–29)
Calcium: 9 mg/dL (ref 8.7–10.3)
Chloride: 95 mmol/L — ABNORMAL LOW (ref 96–106)
Creatinine, Ser: 0.72 mg/dL (ref 0.57–1.00)
GFR calc Af Amer: 88 mL/min/{1.73_m2} (ref >59)
GFR calc non Af Amer: 77 mL/min/{1.73_m2} (ref >59)
Glucose: 111 mg/dL — ABNORMAL HIGH (ref 65–99)
Potassium: 3.8 mmol/L (ref 3.5–5.2)
Sodium: 141 mmol/L (ref 134–144)

## 2020-02-14 LAB — MAGNESIUM: Magnesium: 2.3 mg/dL (ref 1.6–2.3)

## 2020-02-14 MED ORDER — SILDENAFIL CITRATE 20 MG PO TABS: 20.0000 mg | ORAL_TABLET | Freq: Three times a day (TID) | ORAL | 3 refills | Status: DC

## 2020-02-18 ENCOUNTER — Encounter: Payer: Self-pay | Admitting: Cardiology

## 2020-02-21 ENCOUNTER — Telehealth: Payer: Self-pay

## 2020-02-21 NOTE — Telephone Encounter (Signed)
Spoke with patient's daughter and let her know about lab work before appt. Daughter voiced understanding.

## 2020-02-21 NOTE — Telephone Encounter (Signed)
Left daughter a vm to cb.

## 2020-02-21 NOTE — Telephone Encounter (Signed)
-----   Message from [place], [place] sent at 02/18/2020 10:52 PM EDT ----- Regarding: Prior to next visit Please inform the patient's daughter to have labs done prior to the office visit. FU visit is on 6/23 she can have it done couple days prior.   ST

## 2020-02-23 ENCOUNTER — Telehealth: Payer: Self-pay

## 2020-02-23 NOTE — Telephone Encounter (Signed)
As long as she is doing well that is fine. She will need to have blood work several days prior to the next office visit.

## 2020-02-23 NOTE — Telephone Encounter (Signed)
Pt's daughter called and said her mom has lost 3 pounds and wanted  To know if she should still be on the Torsemide?

## 2020-03-13 LAB — BASIC METABOLIC PANEL
BUN/Creatinine Ratio: 25 (ref 12–28)
BUN: 20 mg/dL (ref 8–27)
CO2: 31 mmol/L — ABNORMAL HIGH (ref 20–29)
Calcium: 10 mg/dL (ref 8.7–10.3)
Chloride: 93 mmol/L — ABNORMAL LOW (ref 96–106)
Creatinine, Ser: 0.8 mg/dL (ref 0.57–1.00)
GFR calc Af Amer: 78 mL/min/{1.73_m2} (ref >59)
GFR calc non Af Amer: 67 mL/min/{1.73_m2} (ref >59)
Glucose: 85 mg/dL (ref 65–99)
Potassium: 4 mmol/L (ref 3.5–5.2)
Sodium: 140 mmol/L (ref 134–144)

## 2020-03-13 LAB — MAGNESIUM: Magnesium: 2.4 mg/dL — ABNORMAL HIGH (ref 1.6–2.3)

## 2020-03-13 LAB — PRO B NATRIURETIC PEPTIDE: NT-Pro BNP: 1712 pg/mL — ABNORMAL HIGH (ref 0–738)

## 2020-03-14 ENCOUNTER — Emergency Department (HOSPITAL_COMMUNITY)
Admission: EM | Admit: 2020-03-14 | Discharge: 2020-03-14 | Disposition: A | Discharge status: Home / Self Care | Payer: Medicare Other | Attending: Emergency Medicine | Admitting: Emergency Medicine

## 2020-03-14 ENCOUNTER — Encounter (HOSPITAL_COMMUNITY): Payer: Self-pay | Admitting: Emergency Medicine

## 2020-03-14 ENCOUNTER — Emergency Department (HOSPITAL_COMMUNITY): Payer: Medicare Other

## 2020-03-14 ENCOUNTER — Ambulatory Visit: Payer: Medicare Other | Admitting: Cardiology

## 2020-03-14 ENCOUNTER — Other Ambulatory Visit: Payer: Self-pay

## 2020-03-14 ENCOUNTER — Encounter: Payer: Self-pay | Admitting: Cardiology

## 2020-03-14 VITALS — BP 111/53 | HR 82 | Resp 16 | Ht 65.0 in | Wt 91.4 lb

## 2020-03-14 DIAGNOSIS — I5082 Biventricular heart failure: Secondary | ICD-10-CM

## 2020-03-14 DIAGNOSIS — Z87891 Personal history of nicotine dependence: Secondary | ICD-10-CM

## 2020-03-14 DIAGNOSIS — Y92410 Unspecified street and highway as the place of occurrence of the external cause: Secondary | ICD-10-CM | POA: Insufficient documentation

## 2020-03-14 DIAGNOSIS — I2510 Atherosclerotic heart disease of native coronary artery without angina pectoris: Secondary | ICD-10-CM | POA: Diagnosis not present

## 2020-03-14 DIAGNOSIS — M542 Cervicalgia: Secondary | ICD-10-CM | POA: Diagnosis not present

## 2020-03-14 DIAGNOSIS — Z79899 Other long term (current) drug therapy: Secondary | ICD-10-CM | POA: Diagnosis not present

## 2020-03-14 DIAGNOSIS — Y999 Unspecified external cause status: Secondary | ICD-10-CM | POA: Diagnosis not present

## 2020-03-14 DIAGNOSIS — Y939 Activity, unspecified: Secondary | ICD-10-CM | POA: Insufficient documentation

## 2020-03-14 DIAGNOSIS — E782 Mixed hyperlipidemia: Secondary | ICD-10-CM

## 2020-03-14 DIAGNOSIS — S0990XA Unspecified injury of head, initial encounter: Secondary | ICD-10-CM | POA: Diagnosis not present

## 2020-03-14 DIAGNOSIS — G44309 Post-traumatic headache, unspecified, not intractable: Secondary | ICD-10-CM | POA: Diagnosis not present

## 2020-03-14 DIAGNOSIS — I429 Cardiomyopathy, unspecified: Secondary | ICD-10-CM

## 2020-03-14 DIAGNOSIS — G44319 Acute post-traumatic headache, not intractable: Secondary | ICD-10-CM

## 2020-03-14 MED ORDER — ENTRESTO 24-26 MG PO TABS: 1.0000 | ORAL_TABLET | Freq: Two times a day (BID) | ORAL | 0 refills | Status: DC
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
## 2020-03-14 NOTE — ED Notes (Signed)
She remains in no distress. 

## 2020-03-14 NOTE — Progress Notes (Signed)
Anita Martinez Date of Birth: 1932/11/12 MRN: [id number] [place]  Date: 03/14/20 Last Office Visit: 02/13/2020   Chief Complaint  Patient presents with  . Heart Failure Management  . Follow-up    4 week    HPI  Anita Martinez is a 86 y.o.  female who presents to the office with a chief complaint of " congestive heart failure management ." Patient's past medical history and cardiovascular risk factors include: Newly diagnosed heart failure with reduced EF, cardiomyopathy, severe mitral regurgitation, moderate to severe TR, hyperlipidemia, chronic back pain, anxiety, advanced age, postmenopausal female.  Patient is accompanied by her daughter, Jackelyn Poling, at today's office visit.    Patient was hospitalized in May 2021 at which time she was treated for acute decompensated biventricular heart failure, hypotension, acute kidney injury and Klebsiella UTI.  Cardiology was consulted for acute decompensated biventricular heart failure.  During that hospitalization patient and her family decided to proceed with medical management in regards to her biventricular heart failure and hold off invasive cardiovascular procedures given her presentation, frailty, chronic comorbidities, age.  Patient was not able to tolerate Entresto or Aldactone prior to the discharge due to hypotension.  She did tolerate this low-dose diuretic therapy and was started on sildenafil.  Since last office visit patient has been doing well from a cardiovascular standpoint.  She continues to live alone and does have good family support.  No recent hospitalization for congestive heart failure.  She denies any shortness of breath at rest or with effort related activities.  ALLERGIES: Allergies  Allergen Reactions  . Morphine And Related     Makes hyper and unable to sleep     MEDICATION LIST PRIOR TO VISIT: Current Outpatient Medications on File Prior to Visit  Medication Sig Dispense  Refill  . atorvastatin (LIPITOR) 10 MG tablet Take 10 mg by mouth daily.    . digoxin (LANOXIN) 0.125 MG tablet Take 1 tablet (0.125 mg total) by mouth daily. 30 tablet 1  . feeding supplement, ENSURE ENLIVE, (ENSURE ENLIVE) LIQD Take 237 mLs by mouth 2 (two) times daily between meals. 237 mL 12  . fish oil-omega-3 fatty acids 1000 MG capsule Take 1 g by mouth 2 (two) times daily.    . ivabradine (CORLANOR) 7.5 MG TABS tablet Take 1 tablet (7.5 mg total) by mouth 2 (two) times daily with a meal. 60 tablet 1  . metoprolol tartrate (LOPRESSOR) 25 MG tablet Take 0.5 tablets (12.5 mg total) by mouth 2 (two) times daily. Hold for HR <60 AND SBP <90 30 tablet 2  . Multiple Vitamin (MULTIVITAMIN WITH MINERALS) TABS tablet Take 1 tablet by mouth daily. 30 tablet 1  . oxyCODONE-acetaminophen (PERCOCET/ROXICET) 5-325 MG tablet Take 1 tablet by mouth every 6 (six) hours as needed for moderate pain or severe pain.     Marland Kitchen senna-docusate (SENOKOT-S) 8.6-50 MG tablet Take 1 tablet by mouth 2 (two) times daily. 30 tablet 1  . valACYclovir (VALTREX) 500 MG tablet Take 500 mg by mouth 2 (two) times daily.     No current facility-administered medications on file prior to visit.    PAST MEDICAL HISTORY: Past Medical History:  Diagnosis Date  . Cardiomyopathy ([place])   . CHF (congestive heart failure) ([place])   . Chronic back pain   . Colon polyps   . Colonic diverticular abscess   . MR (mitral regurgitation)   . TR (tricuspid regurgitation)     PAST SURGICAL HISTORY: Past Surgical History:  Procedure Laterality Date  . ABDOMINAL HYSTERECTOMY    . BLADDER SUSPENSION      FAMILY HISTORY: The patient's family history is not on file.   SOCIAL HISTORY:  The patient  reports that she quit smoking about 51 years ago. She started smoking about 65 years ago. She has a 14.00 pack-year smoking history. She has never used smokeless tobacco. She reports that she does not drink alcohol and does not use  drugs.  Review of Systems  Constitutional: Positive for malaise/fatigue. Negative for chills and fever.  HENT: Positive for hearing loss. Negative for hoarse voice and nosebleeds.   Eyes: Negative for discharge, double vision and pain.  Cardiovascular: Negative for chest pain, claudication, dyspnea on exertion, leg swelling, near-syncope, orthopnea, palpitations, paroxysmal nocturnal dyspnea and syncope.  Respiratory: Negative for hemoptysis and shortness of breath.   Musculoskeletal: Negative for muscle cramps and myalgias.  Gastrointestinal: Negative for abdominal pain, constipation, diarrhea, hematemesis, hematochezia, melena, nausea and vomiting.  Neurological: Negative for dizziness and light-headedness.    PHYSICAL EXAM: Vitals with BMI 03/14/2020 03/14/2020 03/14/2020  Height - - 5\' 5"   Weight - - 91 lbs 6 oz  BMI - - 15.21  Systolic 119 113  Diastolic 52 62 53  Pulse 95 103 82    CONSTITUTIONAL: Frail-appearing elderly female.  No acute distress, hemodynamically stable.    SKIN: Skin is warm and dry. No rash noted. No cyanosis. No pallor. No jaundice HEAD: Normocephalic and atraumatic.  EYES: No scleral icterus MOUTH/THROAT: Moist oral membranes.  NECK: No JVD present. No thyromegaly noted. No carotid bruits  LYMPHATIC: No visible cervical adenopathy.  CHEST Normal respiratory effort. No intercostal retractions  LUNGS: Decreased breath sounds at the bases.  No stridor. No wheezes. No rales.  CARDIOVASCULAR: Regular, positive S1-S2, holosystolic murmur heard at the apex, no gallops or rubs ABDOMINAL: No apparent ascites, abdominal hernia.  EXTREMITIES: No peripheral edema   HEMATOLOGIC: No significant bruising NEUROLOGIC: Oriented to person, place, and time. Nonfocal. Normal muscle tone.  PSYCHIATRIC: Normal mood and affect. Normal behavior. Cooperative  CARDIAC DATABASE: EKG: 02/06/2020: Sinus rhythm with 97 bpm, with sinus arrhythmia and premature supraventricular  complexes, LVH per voltage criteria, possible old septal infarct, nonspecific ST-T changes, without underlying injury pattern.  Echocardiogram: 01/31/2020: LVEF less than 20%, severely reduced left ventricular systolic function, global hypokinesis, unable to quantify diastolic. Right ventricular systolic function is severely reduced. The right ventricular size is moderately enlarged. There is mildly elevated pulmonary artery systolic pressure. The estimated right ventricular systolic pressure is 36.5 mmHg. Left atrial size was severely dilated. Right atrial size was severely dilated. Moderate pleural effusion in both left and right lateral regions. The mitral valve is grossly normal. Severe mitral valve regurgitation. Tricuspid valve regurgitation is moderate to severe. The aortic valve is normal in structure. Aortic valve regurgitation is not visualized. No aortic stenosis is present.  LABORATORY DATA: CBC Latest Ref Rng & Units 02/06/2020 02/05/2020 02/04/2020  WBC 4.0 - 10.5 K/uL 13.4(H) 14.4(H) 15.5(H)  Hemoglobin 12.0 - 15.0 g/dL 02/06/2020 09.6 04.5  Hematocrit 36 - 46 % 42.0 42.2 43.0  Platelets 150 - 400 K/uL 101(L) 90(L) 84(L)    CMP Latest Ref Rng & Units 03/12/2020 02/13/2020 02/07/2020  Glucose 65 - 99 mg/dL 85 02/09/2020) 811(B)  BUN 8 - 27 mg/dL 20 24 19   Creatinine 0.57 - 1.00 mg/dL 147(W 2.95  Sodium 134 - 144 mmol/L 140 141 135  Potassium 3.5 - 5.2 mmol/L 4.0 3.8 3.8  Chloride 96 - 106 mmol/L 93(L) 95(L) 94(L)  CO2 20 - 29 mmol/L 31(H) 32(H) 31  Calcium 8.7 - 10.3 mg/dL 73.2 9.0 2.0(U)  Total Protein 6.5 - 8.1 g/dL - - 5.9(L)  Total Bilirubin 0.3 - 1.2 mg/dL - - 1.7(H)  Alkaline Phos 38 - 126 U/L - - 91  AST 15 - 41 U/L - - 80(H)  ALT 0 - 44 U/L - - 76(H)    Lipid Panel  No results found for: CHOL, TRIG, HDL, CHOLHDL, VLDL, LDLCALC, LDLDIRECT, LABVLDL  No results found for: HGBA1C No components found for: NTPROBNP Lab Results  Component Value Date   TSH 1.554 01/30/2020     Cardiac Panel (last 3 results) No results for input(s): CKTOTAL, CKMB, TROPONINIHS, RELINDX in the last 72 hours.  IMPRESSION:    ICD-10-CM   1. Biventricular heart failure, NYHA class II (HCC)  I50.82 sacubitril-valsartan (ENTRESTO) 24-26 MG    Basic metabolic panel    Magnesium    Pro b natriuretic peptide (BNP)[facility])    Pro b natriuretic peptide (BNP)[facility] [facility])    Magnesium    Basic metabolic panel  2. Cardiomyopathy, unspecified type (HCC)  I42.9 sacubitril-valsartan (ENTRESTO) 24-26 MG  3. Mixed hyperlipidemia  E78.2   4. Former smoker  Z87.891      RECOMMENDATIONS: Anita Martinez is a 86 y.o. female whose past medical history and cardiovascular risk factors include: biventricular heart failure, stage C, NYHA class II/III, cardiomyopathy, mixed hyperlipidemia, former smoker, advanced age, postmenopausal female.  Biventricular heart failure, stage C, NYHA class II:  Biventricular heart failure noted during her hospitalization May 2021.  No recurrent hospitalizations for congestive heart failure  Medications reconciled.  Discontinue Revatio and torsemide.  Labs from 03/12/2020 reviewed.   Start Entresto 24/26 mg p.o. twice daily  Check BMP, magnesium level, BNP 1 week.  Would like to see her back in the office for up titration of medications in 2 weeks.  Patient's daughter is informed to call the office if she starts to gain weight or has symptoms of shortness of breath at rest or with effort related activities.  Recommend daily weight check, strict I/O's  Fluid restriction to <2L per day, Na restriction < 2g per day  Repeat echocardiogram around May 02, 2020 to reevaluate LV function.  Mixed hyperlipidemia: Continue statin therapy.  Most recent lipid profile reviewed patient does not endorse myalgias.  Former smoker: Educated on importance of continued smoking cessation.  FINAL MEDICATION LIST END OF  ENCOUNTER:  Current Outpatient Medications:  .  atorvastatin (LIPITOR) 10 MG tablet, Take 10 mg by mouth daily., Disp: , Rfl:  .  digoxin (LANOXIN) 0.125 MG tablet, Take 1 tablet (0.125 mg total) by mouth daily., Disp: 30 tablet, Rfl: 1 .  feeding supplement, ENSURE ENLIVE, (ENSURE ENLIVE) LIQD, Take 237 mLs by mouth 2 (two) times daily between meals., Disp: 237 mL, Rfl: 12 .  fish oil-omega-3 fatty acids 1000 MG capsule, Take 1 g by mouth 2 (two) times daily., Disp: , Rfl:  .  ivabradine (CORLANOR) 7.5 MG TABS tablet, Take 1 tablet (7.5 mg total) by mouth 2 (two) times daily with a meal., Disp: 60 tablet, Rfl: 1 .  metoprolol tartrate (LOPRESSOR) 25 MG tablet, Take 0.5 tablets (12.5 mg total) by mouth 2 (two) times daily. Hold for HR <60 AND SBP <90, Disp: 30 tablet, Rfl: 2 .  Multiple Vitamin (MULTIVITAMIN WITH MINERALS) TABS tablet, Take 1 tablet by mouth daily.,  Disp: 30 tablet, Rfl: 1 .  oxyCODONE-acetaminophen (PERCOCET/ROXICET) 5-325 MG tablet, Take 1 tablet by mouth every 6 (six) hours as needed for moderate pain or severe pain. , Disp: , Rfl:  .  senna-docusate (SENOKOT-S) 8.6-50 MG tablet, Take 1 tablet by mouth 2 (two) times daily., Disp: 30 tablet, Rfl: 1 .  valACYclovir (VALTREX) 500 MG tablet, Take 500 mg by mouth 2 (two) times daily., Disp: , Rfl:  .  sacubitril-valsartan (ENTRESTO) 24-26 MG, Take 1 tablet by mouth 2 (two) times daily., Disp: 60 tablet, Rfl: 0  Orders Placed This Encounter  Procedures  . Basic metabolic panel  . Magnesium  . Pro b natriuretic peptide (BNP)[facility])   --Continue cardiac medications as reconciled in final medication list. --Return in about 2 weeks (around 03/28/2020) for heart failure management.. Or sooner if needed. --Continue follow-up with your primary care physician regarding the management of your other chronic comorbid conditions.  Patient's questions and concerns were addressed to her satisfaction. She voices  understanding of the instructions provided during this encounter.   This note was created using a voice recognition software as a result there may be grammatical errors inadvertently enclosed that do not reflect the nature of this encounter. Every attempt is made to correct such errors.  Tessa Lerner, [place], [facility]  Pager: [phone] Office: [phone]
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
## 2020-03-15 ENCOUNTER — Telehealth: Payer: Self-pay

## 2020-03-15 NOTE — Telephone Encounter (Signed)
LMTCB concerning questions about Sherryll Burger how often to take it. Instructed to call back with any questions.

## 2020-03-23 ENCOUNTER — Other Ambulatory Visit: Payer: Self-pay | Admitting: Cardiology

## 2020-03-23 ENCOUNTER — Telehealth: Payer: Self-pay

## 2020-03-23 DIAGNOSIS — I5082 Biventricular heart failure: Secondary | ICD-10-CM

## 2020-03-23 LAB — MAGNESIUM: Magnesium: 2.4 mg/dL — ABNORMAL HIGH (ref 1.6–2.3)

## 2020-03-23 LAB — BASIC METABOLIC PANEL
BUN/Creatinine Ratio: 17 (ref 12–28)
BUN: 14 mg/dL (ref 8–27)
CO2: 27 mmol/L (ref 20–29)
Calcium: 9.7 mg/dL (ref 8.7–10.3)
Chloride: 100 mmol/L (ref 96–106)
Creatinine, Ser: 0.82 mg/dL (ref 0.57–1.00)
GFR calc Af Amer: 75 mL/min/{1.73_m2} (ref >59)
GFR calc non Af Amer: 65 mL/min/{1.73_m2} (ref >59)
Glucose: 79 mg/dL (ref 65–99)
Potassium: 4.7 mmol/L (ref 3.5–5.2)
Sodium: 141 mmol/L (ref 134–144)

## 2020-03-23 LAB — PRO B NATRIURETIC PEPTIDE: NT-Pro BNP: 1312 pg/mL — ABNORMAL HIGH (ref 0–738)

## 2020-03-23 MED ORDER — TORSEMIDE 10 MG PO TABS: 10.0000 mg | ORAL_TABLET | Freq: Every morning | ORAL | 0 refills | Status: DC

## 2020-03-23 NOTE — Progress Notes (Signed)
Patient was recently started on Entresto and asked to have blood work done in 1 week. Labs from March 22, 2020 reviewed.  Kidney function is stable. She called earlier today stating that she has gained approximately 3 to 4 pounds over the last 1 day. We will start torsemide 10 mg p.o. every morning Repeat blood work in 1 week  Tessa Lerner, [place], [facility]  Pager: [phone] Office: [phone]

## 2020-03-23 NOTE — Telephone Encounter (Signed)
Pt called to inform us that her weight today and yesterday was 94.2lb

## 2020-03-23 NOTE — Telephone Encounter (Signed)
We will restart torsemide at 10 mg p.o. every morning.  Blood work in 1 week.  Prescription sent and labs ordered and released.

## 2020-03-23 NOTE — Telephone Encounter (Signed)
Called pt to inform her to take Torsemide 10 qd, and to repeat labs in a week

## 2020-03-31 LAB — PRO B NATRIURETIC PEPTIDE: NT-Pro BNP: 578 pg/mL (ref 0–738)

## 2020-03-31 LAB — BASIC METABOLIC PANEL
BUN/Creatinine Ratio: 16 (ref 12–28)
BUN: 17 mg/dL (ref 8–27)
CO2: 27 mmol/L (ref 20–29)
Calcium: 9.8 mg/dL (ref 8.7–10.3)
Chloride: 97 mmol/L (ref 96–106)
Creatinine, Ser: 1.09 mg/dL — ABNORMAL HIGH (ref 0.57–1.00)
GFR calc Af Amer: 53 mL/min/{1.73_m2} — ABNORMAL LOW (ref >59)
GFR calc non Af Amer: 46 mL/min/{1.73_m2} — ABNORMAL LOW (ref >59)
Glucose: 78 mg/dL (ref 65–99)
Potassium: 4.5 mmol/L (ref 3.5–5.2)
Sodium: 139 mmol/L (ref 134–144)

## 2020-03-31 LAB — MAGNESIUM: Magnesium: 2.3 mg/dL (ref 1.6–2.3)

## 2020-04-03 ENCOUNTER — Encounter: Payer: Self-pay | Admitting: Cardiology

## 2020-04-03 ENCOUNTER — Ambulatory Visit: Payer: Medicare Other | Admitting: Cardiology

## 2020-04-03 ENCOUNTER — Other Ambulatory Visit: Payer: Self-pay

## 2020-04-03 VITALS — BP 132/44 | HR 48 | Resp 15 | Ht 65.0 in | Wt 96.0 lb

## 2020-04-03 DIAGNOSIS — I429 Cardiomyopathy, unspecified: Secondary | ICD-10-CM

## 2020-04-03 DIAGNOSIS — I5082 Biventricular heart failure: Secondary | ICD-10-CM

## 2020-04-03 DIAGNOSIS — E782 Mixed hyperlipidemia: Secondary | ICD-10-CM

## 2020-04-03 DIAGNOSIS — Z87891 Personal history of nicotine dependence: Secondary | ICD-10-CM

## 2020-04-03 NOTE — Patient Instructions (Addendum)
Call me back  Before Corlanor is due for refill.  Stop digoxin  Echo on or after 05/02/2020.  Follow up after echo.  Labs on or after 04/30/2020.

## 2020-04-03 NOTE — Progress Notes (Signed)
Anita Martinez Date of Birth: Apr 23, 1933 MRN: [id number] Primary Care Provider:Varadarajan, Soyla Murphy  Date: 04/04/20 Last Office Visit: 02/13/2020   Chief Complaint  Patient presents with  . Follow-up    2 week  . Heart Failure Management    HPI  Anita Martinez is a 86 y.o.  female who presents to the office with a chief complaint of " congestive heart failure management ." Patient's past medical history and cardiovascular risk factors include: Newly diagnosed heart failure with reduced EF, cardiomyopathy, severe mitral regurgitation, moderate to severe TR, hyperlipidemia, chronic back pain, anxiety, advanced age, postmenopausal female.  Patient is accompanied by her daughter, Eunice Blase ([phone]), at today's office visit.    Patient was hospitalized in May 2021 at which time she was treated for acute decompensated biventricular heart failure, hypotension, acute kidney injury and Klebsiella UTI.  Cardiology was consulted for acute decompensated biventricular heart failure.  During that hospitalization patient and her family decided to proceed with medical management in regards to her biventricular heart failure and hold off invasive cardiovascular procedures given her presentation, frailty, chronic comorbidities, age.  Patient presents today for follow-up for congestive heart failure management.  At last office visit we discontinued her sildenafil and started her on Entresto.  Patient has tolerated the initiation of Entresto as she has not had any episodes of hypotension and kidney function has remained stable.  Patient remains euvolemic and her weight continues to trend downward since last office visit.  Clinically patient is stable she is walking at least 3 blocks on a daily basis since her last office visit.  Patient's daughter is concerned that her heart rate is trending low in the end of holding majority of her metoprolol doses.    Since last office visit patient has been doing  well from a cardiovascular standpoint.  She continues to live alone and does have good family support.  No recent hospitalization for congestive heart failure.  She denies any shortness of breath at rest or with effort related activities.  ALLERGIES: Allergies  Allergen Reactions  . Morphine And Related     Makes hyper and unable to sleep     MEDICATION LIST PRIOR TO VISIT: Current Outpatient Medications on File Prior to Visit  Medication Sig Dispense Refill  . atorvastatin (LIPITOR) 10 MG tablet Take 10 mg by mouth daily.    . feeding supplement, ENSURE ENLIVE, (ENSURE ENLIVE) LIQD Take 237 mLs by mouth 2 (two) times daily between meals. 237 mL 12  . fish oil-omega-3 fatty acids 1000 MG capsule Take 1 g by mouth 2 (two) times daily.    . ivabradine (CORLANOR) 7.5 MG TABS tablet Take 1 tablet (7.5 mg total) by mouth 2 (two) times daily with a meal. 60 tablet 1  . metoprolol tartrate (LOPRESSOR) 25 MG tablet Take 0.5 tablets (12.5 mg total) by mouth 2 (two) times daily. Hold for HR <60 AND SBP <90 30 tablet 2  . Multiple Vitamin (MULTIVITAMIN WITH MINERALS) TABS tablet Take 1 tablet by mouth daily. 30 tablet 1  . oxyCODONE-acetaminophen (PERCOCET/ROXICET) 5-325 MG tablet Take 1 tablet by mouth every 6 (six) hours as needed for moderate pain or severe pain.     . sacubitril-valsartan (ENTRESTO) 24-26 MG Take 1 tablet by mouth 2 (two) times daily. 60 tablet 0  . senna-docusate (SENOKOT-S) 8.6-50 MG tablet Take 1 tablet by mouth 2 (two) times daily. 30 tablet 1  . torsemide (DEMADEX) 10 MG tablet Take 1 tablet (10 mg total) by  mouth in the morning. 30 tablet 0  . valACYclovir (VALTREX) 500 MG tablet Take 500 mg by mouth 2 (two) times daily.     No current facility-administered medications on file prior to visit.    PAST MEDICAL HISTORY: Past Medical History:  Diagnosis Date  . Cardiomyopathy (HCC)   . CHF (congestive heart failure) (HCC)   . Chronic back pain   . Colon polyps   .  Colonic diverticular abscess   . MR (mitral regurgitation)   . TR (tricuspid regurgitation)     PAST SURGICAL HISTORY: Past Surgical History:  Procedure Laterality Date  . ABDOMINAL HYSTERECTOMY    . BLADDER SUSPENSION      FAMILY HISTORY: The patient's family history is not on file.   SOCIAL HISTORY:  The patient  reports that she quit smoking about 51 years ago. She started smoking about 65 years ago. She has a 14.00 pack-year smoking history. She has never used smokeless tobacco. She reports that she does not drink alcohol and does not use drugs.  Review of Systems  Constitutional: Positive for malaise/fatigue. Negative for chills and fever.  HENT: Positive for hearing loss. Negative for hoarse voice and nosebleeds.   Eyes: Negative for discharge, double vision and pain.  Cardiovascular: Negative for chest pain, claudication, dyspnea on exertion, leg swelling, near-syncope, orthopnea, palpitations, paroxysmal nocturnal dyspnea and syncope.  Respiratory: Negative for hemoptysis and shortness of breath.   Musculoskeletal: Negative for muscle cramps and myalgias.  Gastrointestinal: Negative for abdominal pain, constipation, diarrhea, hematemesis, hematochezia, melena, nausea and vomiting.  Neurological: Negative for dizziness and light-headedness.    PHYSICAL EXAM: Vitals with BMI 04/03/2020 03/14/2020 03/14/2020  Height 5\' 5"  - -  Weight 96 lbs - -  BMI 15.98 - -  Systolic 132 119 157  Diastolic 44 52 62  Pulse 48 95 103    CONSTITUTIONAL: Frail-appearing elderly female.  No acute distress, hemodynamically stable.    SKIN: Skin is warm and dry. No rash noted. No cyanosis. No pallor. No jaundice HEAD: Normocephalic and atraumatic.  EYES: No scleral icterus MOUTH/THROAT: Moist oral membranes.  NECK: No JVD present. No thyromegaly noted. No carotid bruits  LYMPHATIC: No visible cervical adenopathy.  CHEST Normal respiratory effort. No intercostal retractions  LUNGS:  Decreased breath sounds at the bases.  No stridor. No wheezes. No rales.  CARDIOVASCULAR: Regular, positive S1-S2, holosystolic murmur heard at the apex, no gallops or rubs ABDOMINAL: No apparent ascites, abdominal hernia.  EXTREMITIES: No peripheral edema   HEMATOLOGIC: No significant bruising NEUROLOGIC: Oriented to person, place, and time. Nonfocal. Normal muscle tone.  PSYCHIATRIC: Normal mood and affect. Normal behavior. Cooperative  CARDIAC DATABASE: EKG: 02/06/2020: Sinus rhythm with 97 bpm, with sinus arrhythmia and premature supraventricular complexes, LVH per voltage criteria, possible old septal infarct, nonspecific ST-T changes, without underlying injury pattern.  Echocardiogram: 01/31/2020: LVEF less than 20%, severely reduced left ventricular systolic function, global hypokinesis, unable to quantify diastolic. Right ventricular systolic function is severely reduced. The right ventricular size is moderately enlarged. There is mildly elevated pulmonary artery systolic pressure. The estimated right ventricular systolic pressure is 36.5 mmHg. Left atrial size was severely dilated. Right atrial size was severely dilated. Moderate pleural effusion in both left and right lateral regions. The mitral valve is grossly normal. Severe mitral valve regurgitation. Tricuspid valve regurgitation is moderate to severe. The aortic valve is normal in structure. Aortic valve regurgitation is not visualized. No aortic stenosis is present.  LABORATORY DATA: CBC Latest Ref Rng &  Units 02/06/2020 02/05/2020 02/04/2020  WBC 4.0 - 10.5 K/uL 13.4(H) 14.4(H) 15.5(H)  Hemoglobin 12.0 - 15.0 g/dL 29.5 62.1 30.8  Hematocrit 36 - 46 % 42.0 42.2 43.0  Platelets 150 - 400 K/uL 101(L) 90(L) 84(L)    CMP Latest Ref Rng & Units 03/30/2020 03/22/2020 03/12/2020  Glucose 65 - 99 mg/dL 78 79 85  BUN 8 - 27 mg/dL 17 14 20   Creatinine 0.57 - 1.00 mg/dL ) 6.57(Q 4.69  Sodium 134 - 144 mmol/L 139 141 140  Potassium 3.5 -  5.2 mmol/L 4.5 4.7 4.0  Chloride 96 - 106 mmol/L 97 100 93(L)  CO2 20 - 29 mmol/L 27 27 31(H)  Calcium 8.7 - 10.3 mg/dL 9.8 9.7 6.29  Total Protein 6.5 - 8.1 g/dL - - -  Total Bilirubin 0.3 - 1.2 mg/dL - - -  Alkaline Phos 38 - 126 U/L - - -  AST 15 - 41 U/L - - -  ALT 0 - 44 U/L - - -    Lipid Panel  No results found for: CHOL, TRIG, HDL, CHOLHDL, VLDL, LDLCALC, LDLDIRECT, LABVLDL  No results found for: HGBA1C No components found for: NTPROBNP Lab Results  Component Value Date   TSH 1.554 01/30/2020    Cardiac Panel (last 3 results) No results for input(s): CKTOTAL, CKMB, TROPONINIHS, RELINDX in the last 72 hours.  IMPRESSION:    ICD-10-CM   1. Biventricular heart failure, NYHA class 3 (HCC)  I50.82 PCV ECHOCARDIOGRAM COMPLETE    Magnesium    Pro b natriuretic peptide (BNP)    Basic metabolic panel  2. Cardiomyopathy, unspecified type (HCC)  I42.9 PCV ECHOCARDIOGRAM COMPLETE    Magnesium    Pro b natriuretic peptide (BNP)    Basic metabolic panel  3. Mixed hyperlipidemia  E78.2   4. Former smoker  Z87.891      RECOMMENDATIONS: Anita Martinez is a 86 y.o. female whose past medical history and cardiovascular risk factors include: biventricular heart failure, stage C, NYHA class II, cardiomyopathy, mixed hyperlipidemia, former smoker, advanced age, postmenopausal female.  Biventricular heart failure, stage C, NYHA class II:  Biventricular heart failure noted during her hospitalization May 2021.  No recurrent hospitalizations for congestive heart failure  Medications reconciled.  She has remained stable after discontinuation of  Revatio.  Labs from 03/30/2020 reviewed with patient and daughter. NTprobnp has improved and trends down.   Continue Entresto 24/26 mg p.o. twice daily  Continue Torsemide 10mg  po day.   Daughter states that she has been holding metoprolol often due to low HR. Will d/c digoxin for now.   Prior to refilling ivabradine patient is asked  to call the office.  Tentative plan is to decrease the dose of ivabradine to 5 mg p.o. twice daily if they are still holding Toprol-XL despite discontinuation of digoxin.  She verbalized understanding.  Plan echocardiogram on or after May 02, 2020.  Repeat blood work on April 30, 2020 prior to next office visit  Patient's daughter states that if the LVEF remains reduced despite aggressive medical management she and her mom did not want to consider AICD implantation.  The echo is being done to see if she has responded to guideline directed medical therapy.  Patient's daughter is informed to call the office if she starts to gain weight or has symptoms of shortness of breath at rest or with effort related activities.  Recommend daily weight check, strict I/O's  Fluid restriction to <2L per day, Na restriction < 2g  per day  Repeat echocardiogram around May 02, 2020 to reevaluate LV function.  Mixed hyperlipidemia: Continue statin therapy.  Most recent lipid profile reviewed patient does not endorse myalgias.  Former smoker: Educated on importance of continued smoking cessation.  FINAL MEDICATION LIST END OF ENCOUNTER:  Current Outpatient Medications:  .  atorvastatin (LIPITOR) 10 MG tablet, Take 10 mg by mouth daily., Disp: , Rfl:  .  feeding supplement, ENSURE ENLIVE, (ENSURE ENLIVE) LIQD, Take 237 mLs by mouth 2 (two) times daily between meals., Disp: 237 mL, Rfl: 12 .  fish oil-omega-3 fatty acids 1000 MG capsule, Take 1 g by mouth 2 (two) times daily., Disp: , Rfl:  .  ivabradine (CORLANOR) 7.5 MG TABS tablet, Take 1 tablet (7.5 mg total) by mouth 2 (two) times daily with a meal., Disp: 60 tablet, Rfl: 1 .  metoprolol tartrate (LOPRESSOR) 25 MG tablet, Take 0.5 tablets (12.5 mg total) by mouth 2 (two) times daily. Hold for HR <60 AND SBP <90, Disp: 30 tablet, Rfl: 2 .  Multiple Vitamin (MULTIVITAMIN WITH MINERALS) TABS tablet, Take 1 tablet by mouth daily., Disp: 30 tablet, Rfl:  1 .  oxyCODONE-acetaminophen (PERCOCET/ROXICET) 5-325 MG tablet, Take 1 tablet by mouth every 6 (six) hours as needed for moderate pain or severe pain. , Disp: , Rfl:  .  sacubitril-valsartan (ENTRESTO) 24-26 MG, Take 1 tablet by mouth 2 (two) times daily., Disp: 60 tablet, Rfl: 0 .  senna-docusate (SENOKOT-S) 8.6-50 MG tablet, Take 1 tablet by mouth 2 (two) times daily., Disp: 30 tablet, Rfl: 1 .  torsemide (DEMADEX) 10 MG tablet, Take 1 tablet (10 mg total) by mouth in the morning., Disp: 30 tablet, Rfl: 0 .  valACYclovir (VALTREX) 500 MG tablet, Take 500 mg by mouth 2 (two) times daily., Disp: , Rfl:   Orders Placed This Encounter  Procedures  . Magnesium  . Pro b natriuretic peptide (BNP)  . Basic metabolic panel  . PCV ECHOCARDIOGRAM COMPLETE   --Continue cardiac medications as reconciled in final medication list. --Return in about 6 weeks (around 05/15/2020) for heart failure management.. Or sooner if needed. --Continue follow-up with your primary care physician regarding the management of your other chronic comorbid conditions.  Patient's questions and concerns were addressed to her satisfaction. She voices understanding of the instructions provided during this encounter.   This note was created using a voice recognition software as a result there may be grammatical errors inadvertently enclosed that do not reflect the nature of this encounter. Every attempt is made to correct such errors.  Tessa Lerner, [place], [facility]  Pager: [phone] Office: [phone]

## 2020-04-09 ENCOUNTER — Other Ambulatory Visit: Payer: Self-pay | Admitting: Cardiology

## 2020-04-09 DIAGNOSIS — I5082 Biventricular heart failure: Secondary | ICD-10-CM

## 2020-04-09 DIAGNOSIS — I429 Cardiomyopathy, unspecified: Secondary | ICD-10-CM

## 2020-04-22 ENCOUNTER — Other Ambulatory Visit: Payer: Self-pay | Admitting: Cardiology

## 2020-04-22 DIAGNOSIS — I5082 Biventricular heart failure: Secondary | ICD-10-CM

## 2020-04-23 ENCOUNTER — Telehealth: Payer: Self-pay

## 2020-04-23 NOTE — Telephone Encounter (Signed)
Patient daughter Gavin Pound called to speak with you concerning mothers medication Corlanor. She has only 5 days left. Wants to speak with you personally.

## 2020-04-24 ENCOUNTER — Other Ambulatory Visit: Payer: Self-pay | Admitting: Cardiology

## 2020-04-24 DIAGNOSIS — I5082 Biventricular heart failure: Secondary | ICD-10-CM

## 2020-04-24 MED ORDER — METOPROLOL TARTRATE 25 MG PO TABS: 12.5000 mg | ORAL_TABLET | Freq: Two times a day (BID) | ORAL | 2 refills | Status: DC

## 2020-04-24 MED ORDER — CORLANOR 5 MG PO TABS: 5.0000 mg | ORAL_TABLET | Freq: Two times a day (BID) | ORAL | 0 refills | Status: DC

## 2020-04-24 NOTE — Telephone Encounter (Signed)
Spoke to her daughter and refilled her medications.

## 2020-05-01 ENCOUNTER — Other Ambulatory Visit: Payer: Medicare Other

## 2020-05-07 ENCOUNTER — Other Ambulatory Visit: Payer: Self-pay

## 2020-05-07 ENCOUNTER — Ambulatory Visit: Payer: Medicare Other

## 2020-05-07 DIAGNOSIS — I5082 Biventricular heart failure: Secondary | ICD-10-CM

## 2020-05-07 DIAGNOSIS — I429 Cardiomyopathy, unspecified: Secondary | ICD-10-CM

## 2020-05-08 ENCOUNTER — Other Ambulatory Visit: Payer: Self-pay | Admitting: Cardiology

## 2020-05-08 DIAGNOSIS — I429 Cardiomyopathy, unspecified: Secondary | ICD-10-CM

## 2020-05-08 DIAGNOSIS — I5082 Biventricular heart failure: Secondary | ICD-10-CM

## 2020-05-10 ENCOUNTER — Telehealth: Payer: Self-pay

## 2020-05-10 NOTE — Telephone Encounter (Signed)
Spoke to the her daughter Gavin Pound and answered her questions.

## 2020-05-10 NOTE — Progress Notes (Signed)
This message was take care of by Dr. Odis Hollingshead.

## 2020-05-10 NOTE — Telephone Encounter (Signed)
look

## 2020-05-12 LAB — BRAIN NATRIURETIC PEPTIDE: BNP: 181.1 pg/mL — ABNORMAL HIGH (ref 0.0–100.0)

## 2020-05-15 ENCOUNTER — Other Ambulatory Visit: Payer: Self-pay

## 2020-05-15 ENCOUNTER — Encounter: Payer: Self-pay | Admitting: Cardiology

## 2020-05-15 ENCOUNTER — Ambulatory Visit: Payer: Medicare Other | Admitting: Cardiology

## 2020-05-15 VITALS — BP 110/58 | HR 70 | Resp 16 | Ht 65.0 in | Wt 100.0 lb

## 2020-05-15 DIAGNOSIS — I5022 Chronic systolic (congestive) heart failure: Secondary | ICD-10-CM

## 2020-05-15 DIAGNOSIS — E782 Mixed hyperlipidemia: Secondary | ICD-10-CM

## 2020-05-15 DIAGNOSIS — Z87891 Personal history of nicotine dependence: Secondary | ICD-10-CM

## 2020-05-15 DIAGNOSIS — H9313 Tinnitus, bilateral: Secondary | ICD-10-CM

## 2020-05-15 MED ORDER — ENTRESTO 24-26 MG PO TABS: 2.0000 | ORAL_TABLET | Freq: Two times a day (BID) | ORAL | Status: DC

## 2020-05-15 NOTE — Progress Notes (Signed)
Anita Martinez Date of Birth: 10-Nov-1932 MRN: [id number] Primary Care Provider:Varadarajan, Soyla Murphy  Date: 05/16/20 Last Office Visit: 04/03/2020  Chief Complaint  Patient presents with  . Heart Failure Management  . Follow-up    6 weeks    HPI  Anita Martinez is a 86 y.o.  female who presents to the office with a chief complaint of " congestive heart failure management ." Patient's past medical history and cardiovascular risk factors include: Newly diagnosed heart failure with reduced EF, cardiomyopathy, severe mitral regurgitation, moderate to severe TR, hyperlipidemia, chronic back pain, anxiety, advanced age, postmenopausal female.  Patient is accompanied by her daughter, Eunice Blase ([phone]), at today's office visit.    Patient was hospitalized in May 2021 at which time she was treated for acute decompensated biventricular heart failure, hypotension, acute kidney injury and Klebsiella UTI.  Cardiology was consulted for acute decompensated biventricular heart failure.  During that hospitalization patient and her family decided to proceed with medical management in regards to her biventricular heart failure and hold off invasive cardiovascular procedures given her presentation, frailty, chronic comorbidities, age.  Patient presents today for follow-up for congestive heart failure management.  Patient has tolerated the initiation of Entresto as she has not had any episodes of hypotension and kidney function has remained stable.  Patient remains euvolemic and her weight is trending up most likely due to caloric intake.  Since last office visit patient states that her shortness of breath is improving.  Clinically patient is stable she is walking at least 3 blocks on a daily basis.  Her ventricular rate is also improved after decreasing corlanor to  5 mg p.o. twice daily and is able to use Toprol-XL more often.   Patient also had a repeat echocardiogram to evaluate EF since last office  visit.  There has been minimal improvement in LVEF but clinically patient is more stable compared to when she was recently hospitalized back in May 2021.  She has been having more tinnitus than her usual baseline since last office visit.  ALLERGIES: Allergies  Allergen Reactions  . Morphine And Related     Makes hyper and unable to sleep    MEDICATION LIST PRIOR TO VISIT: Current Outpatient Medications on File Prior to Visit  Medication Sig Dispense Refill  . atorvastatin (LIPITOR) 10 MG tablet Take 10 mg by mouth daily.    Retia Passe 5 MG TABS tablet Take 1 tablet (5 mg total) by mouth 2 (two) times daily with a meal. 60 tablet 0  . feeding supplement, ENSURE ENLIVE, (ENSURE ENLIVE) LIQD Take 237 mLs by mouth 2 (two) times daily between meals. 237 mL 12  . fish oil-omega-3 fatty acids 1000 MG capsule Take 1 g by mouth 2 (two) times daily.    . metoprolol tartrate (LOPRESSOR) 25 MG tablet Take 0.5 tablets (12.5 mg total) by mouth 2 (two) times daily. Hold for HR <60 AND SBP <90 30 tablet 2  . Multiple Vitamin (MULTIVITAMIN WITH MINERALS) TABS tablet Take 1 tablet by mouth daily. 30 tablet 1  . oxyCODONE-acetaminophen (PERCOCET/ROXICET) 5-325 MG tablet Take 1 tablet by mouth every 6 (six) hours as needed for moderate pain or severe pain.     Marland Kitchen senna-docusate (SENOKOT-S) 8.6-50 MG tablet Take 1 tablet by mouth 2 (two) times daily. 30 tablet 1  . valACYclovir (VALTREX) 500 MG tablet Take 500 mg by mouth 2 (two) times daily.     No current facility-administered medications on file prior to visit.  PAST MEDICAL HISTORY: Past Medical History:  Diagnosis Date  . Cardiomyopathy (HCC)   . CHF (congestive heart failure) (HCC)   . Chronic back pain   . Colon polyps   . Colonic diverticular abscess   . MR (mitral regurgitation)   . TR (tricuspid regurgitation)     PAST SURGICAL HISTORY: Past Surgical History:  Procedure Laterality Date  . ABDOMINAL HYSTERECTOMY    . BLADDER  SUSPENSION      FAMILY HISTORY: The patient's family history is not on file.   SOCIAL HISTORY:  The patient  reports that she quit smoking about 51 years ago. She started smoking about 65 years ago. She has a 14.00 pack-year smoking history. She has never used smokeless tobacco. She reports that she does not drink alcohol and does not use drugs.  Review of Systems  Constitutional: Positive for malaise/fatigue. Negative for chills and fever.  HENT: Positive for hearing loss and tinnitus. Negative for hoarse voice and nosebleeds.   Eyes: Negative for discharge, double vision and pain.  Cardiovascular: Negative for chest pain, claudication, dyspnea on exertion, leg swelling, near-syncope, orthopnea, palpitations, paroxysmal nocturnal dyspnea and syncope.  Respiratory: Negative for hemoptysis and shortness of breath.   Musculoskeletal: Negative for muscle cramps and myalgias.  Gastrointestinal: Negative for abdominal pain, constipation, diarrhea, hematemesis, hematochezia, melena, nausea and vomiting.  Neurological: Negative for dizziness and light-headedness.    PHYSICAL EXAM: Vitals with BMI 05/15/2020 04/03/2020 03/14/2020  Height 5\' 5"  5\' 5"  -  Weight 100 lbs 96 lbs -  BMI 16.64 15.98 -  Systolic 110 132  Diastolic 58 44 52  Pulse 70 48 95    CONSTITUTIONAL: Frail-appearing elderly female.  No acute distress, hemodynamically stable.    SKIN: Skin is warm and dry. No rash noted. No cyanosis. No pallor. No jaundice HEAD: Normocephalic and atraumatic.  EYES: No scleral icterus MOUTH/THROAT: Moist oral membranes.  NECK: No JVD present. No thyromegaly noted. No carotid bruits  LYMPHATIC: No visible cervical adenopathy.  CHEST Normal respiratory effort. No intercostal retractions  LUNGS: Decreased breath sounds at the bases.  No stridor. No wheezes. No rales.  CARDIOVASCULAR: Regular, positive S1-S2, holosystolic murmur heard at the apex, no gallops or rubs ABDOMINAL: No apparent  ascites, abdominal hernia.  EXTREMITIES: No peripheral edema   HEMATOLOGIC: No significant bruising NEUROLOGIC: Oriented to person, place, and time. Nonfocal. Normal muscle tone.  PSYCHIATRIC: Normal mood and affect. Normal behavior. Cooperative  CARDIAC DATABASE: EKG: 02/06/2020: Sinus rhythm with 97 bpm, with sinus arrhythmia and premature supraventricular complexes, LVH per voltage criteria, possible old septal infarct, nonspecific ST-T changes, without underlying injury pattern.  Echocardiogram: 01/31/2020: LVEF less than 20%, severely reduced left ventricular systolic function, global hypokinesis, unable to quantify diastolic. Right ventricular systolic function is severely reduced. The right ventricular size is moderately enlarged. There is mildly elevated pulmonary artery systolic pressure. The estimated right ventricular systolic pressure is 36.5 mmHg. Left atrial size was severely dilated. Right atrial size was severely dilated. Moderate pleural effusion in both left and right lateral regions. The mitral valve is grossly normal. Severe mitral valve regurgitation. Tricuspid valve regurgitation is moderate to severe. The aortic valve is normal in structure. Aortic valve regurgitation is not visualized. No aortic stenosis is present.  05/07/2020:  Left ventricle cavity is mildly dilated. Normal wall thickenss. Severe global hypokinesis. LVEF 20-25%.  Grade 3 diastolic impairment, elevated left atrial pressure, Moderately reduced right ventricular function.  Left atrial cavity is severely dilated at 56 cc/m2. Trileaflet  aortic valve with mild aortic valve leaflet calcification. Trace aortic stenosis. No regurgitation.  Moderate (Grade III) mitral regurgitation.  Inadequate TR jet to estimate pulmonary artery systolic pressure. Normal right atrial pressure.  No significant change compared to previous study on 01/31/2020.  LABORATORY DATA: CBC Latest Ref Rng & Units 02/06/2020 02/05/2020 02/04/2020   WBC 4.0 - 10.5 K/uL 13.4(H) 14.4(H) 15.5(H)  Hemoglobin 12.0 - 15.0 g/dL 51.7 61.6 07.3  Hematocrit 36 - 46 % 42.0 42.2 43.0  Platelets 150 - 400 K/uL 101(L) 90(L) 84(L)    CMP Latest Ref Rng & Units 03/30/2020 03/22/2020 03/12/2020  Glucose 65 - 99 mg/dL 78 79 85  BUN 8 - 27 mg/dL 17 14 20   Creatinine 0.57 - 1.00 mg/dL 7.10(G) 2.69 4.85  Sodium 134 - 144 mmol/L 139 141 140  Potassium 3.5 - 5.2 mmol/L 4.5 4.7 4.0  Chloride 96 - 106 mmol/L 97 100 93(L)  CO2 20 - 29 mmol/L 27 27 31(H)  Calcium 8.7 - 10.3 mg/dL 9.8 9.7 46.2  Total Protein 6.5 - 8.1 g/dL - - -  Total Bilirubin 0.3 - 1.2 mg/dL - - -  Alkaline Phos 38 - 126 U/L - - -  AST 15 - 41 U/L - - -  ALT 0 - 44 U/L - - -    Lipid Panel  No results found for: CHOL, TRIG, HDL, CHOLHDL, VLDL, LDLCALC, LDLDIRECT, LABVLDL  No results found for: HGBA1C No components found for: NTPROBNP Lab Results  Component Value Date   TSH 1.554 01/30/2020    Cardiac Panel (last 3 results) No results for input(s): CKTOTAL, CKMB, TROPONINIHS, RELINDX in the last 72 hours.  IMPRESSION:    ICD-10-CM   1. Chronic HFrEF (heart failure with reduced ejection fraction) (HCC)  I50.22 sacubitril-valsartan (ENTRESTO) 24-26 MG    Basic metabolic panel    Magnesium    Pro b natriuretic peptide (BNP)  2. Mixed hyperlipidemia  E78.2   3. Former smoker  Z87.891   4. Tinnitus of both ears  H93.13      RECOMMENDATIONS: Anita Martinez is a 86 y.o. female whose past medical history and cardiovascular risk factors include: biventricular heart failure, stage C, NYHA class II, cardiomyopathy, mixed hyperlipidemia, former smoker, advanced age, postmenopausal female.  Chronic HFrEF, stage C, NYHA class II:  Biventricular heart failure noted during her hospitalization May 2021.  No recurrent hospitalizations for congestive heart failure  Medications reconciled.  She has remained stable after discontinuation of  Revatio.  Increase Entresto to two tabs  24/26 mg p.o. twice daily, if renal function remains stable and patient can tolerate the higher dose will refill the next script at [facility] 49/51mg  po bid.   Hold Torsemide 10mg  po day due to tinnitus and uptitration of Entresto.   Blood work in 1 week to evaluate kidney function and electrolytes.  Continue metoprolol with holding parameters.   Ivabradine 5 mg p.o. twice daily    Most recent echo results reviewed with the patient. LVEF improved but still severely reduced. They do not want to proceed with AICD evaluation at this time.   Patient's daughter is informed to call the office if she starts to gain weight or has symptoms of shortness of breath at rest or with effort related activities.  Recommend daily weight check, strict I/O's  Fluid restriction to <2L per day, Na restriction < 2g per day  Mixed hyperlipidemia: Continue statin therapy.  Most recent lipid profile reviewed patient does not endorse myalgias.  Former smoker: Educated on importance of continued smoking cessation.  Tinnitus: Patient's daughter states that she is been having tinnitus for several years now.  But it may also be secondary to her torsemide.  I have asked her to hold torsemide as were uptitrating Entresto for now.  She should have it evaluated either by her PCP or ENT will defer to them at this time.  Patient's daughter verbalized understanding  FINAL MEDICATION LIST END OF ENCOUNTER: Medications Discontinued During This Encounter  Medication Reason  . ENTRESTO 24-26 MG Dose change  . torsemide (DEMADEX) 10 MG tablet Discontinued by provider    Current Outpatient Medications:  .  atorvastatin (LIPITOR) 10 MG tablet, Take 10 mg by mouth daily., Disp: , Rfl:  .  CORLANOR 5 MG TABS tablet, Take 1 tablet (5 mg total) by mouth 2 (two) times daily with a meal., Disp: 60 tablet, Rfl: 0 .  feeding supplement, ENSURE ENLIVE, (ENSURE ENLIVE) LIQD, Take 237 mLs by mouth 2 (two) times daily between meals., Disp:  237 mL, Rfl: 12 .  fish oil-omega-3 fatty acids 1000 MG capsule, Take 1 g by mouth 2 (two) times daily., Disp: , Rfl:  .  metoprolol tartrate (LOPRESSOR) 25 MG tablet, Take 0.5 tablets (12.5 mg total) by mouth 2 (two) times daily. Hold for HR <60 AND SBP <90, Disp: 30 tablet, Rfl: 2 .  Multiple Vitamin (MULTIVITAMIN WITH MINERALS) TABS tablet, Take 1 tablet by mouth daily., Disp: 30 tablet, Rfl: 1 .  oxyCODONE-acetaminophen (PERCOCET/ROXICET) 5-325 MG tablet, Take 1 tablet by mouth every 6 (six) hours as needed for moderate pain or severe pain. , Disp: , Rfl:  .  senna-docusate (SENOKOT-S) 8.6-50 MG tablet, Take 1 tablet by mouth 2 (two) times daily., Disp: 30 tablet, Rfl: 1 .  valACYclovir (VALTREX) 500 MG tablet, Take 500 mg by mouth 2 (two) times daily., Disp: , Rfl:  .  sacubitril-valsartan (ENTRESTO) 24-26 MG, Take 2 tablets by mouth 2 (two) times daily., Disp: 60 tablet, Rfl:   Orders Placed This Encounter  Procedures  . Basic metabolic panel  . Magnesium  . Pro b natriuretic peptide (BNP)   --Continue cardiac medications as reconciled in final medication list. --Return in about 8 weeks (around 07/10/2020) for heart failure management.. Or sooner if needed. --Continue follow-up with your primary care physician regarding the management of your other chronic comorbid conditions.  Patient's questions and concerns were addressed to her satisfaction. She voices understanding of the instructions provided during this encounter.   This note was created using a voice recognition software as a result there may be grammatical errors inadvertently enclosed that do not reflect the nature of this encounter. Every attempt is made to correct such errors.  Total time spent: 35 minutes.  Tessa Lerner, [place], [facility]  Pager: [phone] Office: [phone]

## 2020-05-22 ENCOUNTER — Other Ambulatory Visit: Payer: Self-pay | Admitting: Cardiology

## 2020-05-22 DIAGNOSIS — I5082 Biventricular heart failure: Secondary | ICD-10-CM

## 2020-05-23 LAB — BASIC METABOLIC PANEL
BUN/Creatinine Ratio: 22 (ref 12–28)
BUN: 22 mg/dL (ref 8–27)
CO2: 25 mmol/L (ref 20–29)
Calcium: 9.7 mg/dL (ref 8.7–10.3)
Chloride: 101 mmol/L (ref 96–106)
Creatinine, Ser: 1.01 mg/dL — ABNORMAL HIGH (ref 0.57–1.00)
GFR calc Af Amer: 59 mL/min/{1.73_m2} — ABNORMAL LOW (ref >59)
GFR calc non Af Amer: 51 mL/min/{1.73_m2} — ABNORMAL LOW (ref >59)
Glucose: 83 mg/dL (ref 65–99)
Potassium: 5 mmol/L (ref 3.5–5.2)
Sodium: 137 mmol/L (ref 134–144)

## 2020-05-23 LAB — MAGNESIUM: Magnesium: 2.4 mg/dL — ABNORMAL HIGH (ref 1.6–2.3)

## 2020-05-23 LAB — PRO B NATRIURETIC PEPTIDE: NT-Pro BNP: 422 pg/mL (ref 0–738)

## 2020-05-25 NOTE — Progress Notes (Signed)
Called patient, NA, LMAM

## 2020-05-27 ENCOUNTER — Other Ambulatory Visit: Payer: Self-pay | Admitting: Cardiology

## 2020-05-27 DIAGNOSIS — I5022 Chronic systolic (congestive) heart failure: Secondary | ICD-10-CM

## 2020-05-30 ENCOUNTER — Other Ambulatory Visit: Payer: Self-pay

## 2020-05-30 MED ORDER — ENTRESTO 49-51 MG PO TABS: 1.0000 | ORAL_TABLET | Freq: Two times a day (BID) | ORAL | 1 refills | Status: DC

## 2020-07-10 ENCOUNTER — Other Ambulatory Visit: Payer: Self-pay

## 2020-07-10 ENCOUNTER — Encounter: Payer: Self-pay | Admitting: Cardiology

## 2020-07-10 ENCOUNTER — Ambulatory Visit: Payer: Medicare Other | Admitting: Cardiology

## 2020-07-10 VITALS — BP 100/66 | HR 82 | Ht 65.0 in | Wt 101.8 lb

## 2020-07-10 DIAGNOSIS — E782 Mixed hyperlipidemia: Secondary | ICD-10-CM

## 2020-07-10 DIAGNOSIS — Z87891 Personal history of nicotine dependence: Secondary | ICD-10-CM

## 2020-07-10 DIAGNOSIS — I5022 Chronic systolic (congestive) heart failure: Secondary | ICD-10-CM

## 2020-07-10 NOTE — Progress Notes (Signed)
Telephone visit note  Subjective:   Anita Martinez, female    DOB: 06-Jul-1933, 86 y.o.   MRN: [id number]  I connected with the patient on 07/10/20 by a telephone call and verified that I am speaking with the correct person using two identifiers.     I offered the patient a video enabled application for a virtual visit. Unfortunately, this could not be accomplished due to technical difficulties/lack of video enabled phone/computer. I discussed the limitations of evaluation and management by telemedicine and the availability of in person appointments. The patient expressed understanding and agreed to proceed.   This visit type was conducted due to national recommendations for restrictions regarding the COVID-19 Pandemic (e.g. social distancing).  This format is felt to be most appropriate for this patient at this time.  All issues noted in this document were discussed and addressed.  No physical exam was performed. The patient has consented to conduct a Tele health visit and understands insurance will be billed.   Chief complaint:  Heart failure management  HPI  86 y.o. Caucasian female with past medical history and cardiovascular risk factors include: Chronic heart failure with reduced EF, cardiomyopathy/stage C/NYHA class II, severe mitral regurgitation, moderate to severe TR, hyperlipidemia, chronic back pain, anxiety, advanced age, postmenopausal female.  Patient is accompanied by her daughter, Anita Martinez ([id number]), at today's office visit.    Patient was hospitalized in May 2021 at which time she was treated for acute decompensated biventricular heart failure, hypotension, acute kidney injury and Klebsiella UTI. Cardiology was consulted for acute decompensated biventricular heart failure. During that hospitalization patient and her familydecided to proceed with medical management in regards to her biventricular heart failure and hold off invasive cardiovascular procedures given her  presentation, frailty, chronic comorbidities, age.  Since last office visit patient is doing well from a cardiovascular standpoint.  She has not been to be admitted to the hospital or seen in urgent care for cardiovascular symptoms.  Patient denies any orthopnea, paroxysmal nocturnal dyspnea or lower extremity swelling. Patient's daughter keeps a log of her weight which has been relatively stable.  She weighs between 100-103 pounds since last office visit. Patient systolic blood pressures usually range between 108-110 mmHg and diastolic blood pressures are between 66-80 mmHg.  She was transitioned to Entresto 49/51 mg p.o. twice daily and has tolerated the medication well without any side effects or intolerances.  She is no longer on torsemide as discussed at last office visit and her tinnitus has improved.   Patient and her daughter verbalized they do not want to proceed with EP evaluation for ICD implantation given her underlying age, frailty.    Past Medical History:  Diagnosis Date  . Cardiomyopathy (HCC)   . CHF (congestive heart failure) (HCC)   . Chronic back pain   . Colon polyps   . Colonic diverticular abscess   . MR (mitral regurgitation)   . TR (tricuspid regurgitation)    Past Surgical History:  Procedure Laterality Date  . ABDOMINAL HYSTERECTOMY    . BLADDER SUSPENSION     Social History   Tobacco Use  . Smoking status: Former Smoker    Packs/day: 1.00    Years: 14.00    Pack years: 14.00    Types: Cigarettes    Start date: 71    Quit date: 1970    Years since quitting: 51.8  . Smokeless tobacco: Never Used  Vaping Use  . Vaping Use: Never used  Substance Use Topics  .  Alcohol use: No  . Drug use: No   Family Hx:  No family history of premature coronary disease or sudden cardiac death.  Outpatient Medication: Current Outpatient Medications on File Prior to Visit  Medication Sig Dispense Refill  . CORLANOR 5 MG TABS tablet TAKE 1 TABLET(5 MG) BY  MOUTH TWICE DAILY WITH A MEAL 60 tablet 3  . feeding supplement, ENSURE ENLIVE, (ENSURE ENLIVE) LIQD Take 237 mLs by mouth 2 (two) times daily between meals. 237 mL 12  . fish oil-omega-3 fatty acids 1000 MG capsule Take 1 g by mouth 2 (two) times daily.    Marland Kitchen HYDROcodone-acetaminophen (NORCO/VICODIN) 5-325 MG tablet Take 1 tablet by mouth every 6 (six) hours as needed for moderate pain.    . metoprolol tartrate (LOPRESSOR) 25 MG tablet Take 0.5 tablets (12.5 mg total) by mouth 2 (two) times daily. Hold for HR <60 AND SBP <90 30 tablet 2  . Multiple Vitamin (MULTIVITAMIN WITH MINERALS) TABS tablet Take 1 tablet by mouth daily. 30 tablet 1  . sacubitril-valsartan (ENTRESTO) 49-51 MG Take 1 tablet by mouth 2 (two) times daily. 180 tablet 1  . senna-docusate (SENOKOT-S) 8.6-50 MG tablet Take 1 tablet by mouth 2 (two) times daily. 30 tablet 1  . valACYclovir (VALTREX) 500 MG tablet Take 500 mg by mouth 2 (two) times daily.     No current facility-administered medications on file prior to visit.    Cardiovascular and other pertinent studies:  EKG: 02/06/2020: Sinus rhythm with 97 bpm, with sinus arrhythmia and premature supraventricular complexes, LVH per voltage criteria, possible old septal infarct, nonspecific ST-T changes, without underlying injury pattern.  Echocardiogram: 01/31/2020: LVEF less than 20%, severely reduced left ventricular systolic function, global hypokinesis, unable to quantify diastolic. Right ventricular systolic function is severely reduced. The right ventricular size is moderately enlarged. There is mildly elevated pulmonary artery systolic pressure. The estimated right ventricular systolic pressure is 36.5 mmHg. Left atrial size was severely dilated. Right atrial size was severely dilated. Moderate pleural effusion in both left and right lateral regions. The mitral valve is grossly normal. Severe mitral valve regurgitation. Tricuspid valve regurgitation is moderate to severe.  The aortic valve is normal in structure. Aortic valve regurgitation is not visualized. No aortic stenosis is present.  05/07/2020:  Left ventricle cavity is mildly dilated. Normal wall thickenss. Severe global hypokinesis. LVEF 20-25%.  Grade 3 diastolic impairment, elevated left atrial pressure, Moderately reduced right ventricular function.  Left atrial cavity is severely dilated at 56 cc/m2. Trileaflet aortic valve with mild aortic valve leaflet calcification. Trace aortic stenosis. No regurgitation.  Moderate (Grade III) mitral regurgitation.  Inadequate TR jet to estimate pulmonary artery systolic pressure. Normal right atrial pressure.  No significant change compared to previous study on 01/31/2020.  LABORATORY DATA: CBC Latest Ref Rng & Units 02/06/2020 02/05/2020 02/04/2020  WBC 4.0 - 10.5 K/uL 13.4(H) 14.4(H) 15.5(H)  Hemoglobin 12.0 - 15.0 g/dL 25.8 52.7 78.2  Hematocrit 36 - 46 % 42.0 42.2 43.0  Platelets 150 - 400 K/uL 101(L) 90(L) 84(L)    CMP Latest Ref Rng & Units 05/22/2020 03/30/2020 03/22/2020  Glucose 65 - 99 mg/dL 83 78 79  BUN 8 - 27 mg/dL 22 17 14   Creatinine 0.57 - 1.00 mg/dL ) 4.23(N) 3.61(W  Sodium 134 - 144 mmol/L 137 139 141  Potassium 3.5 - 5.2 mmol/L 5.0 4.5 4.7  Chloride 96 - 106 mmol/L 101 97 100  CO2 20 - 29 mmol/L 25 27 27   Calcium 8.7 -  10.3 mg/dL 9.7 9.8 9.7  Total Protein 6.5 - 8.1 g/dL - - -  Total Bilirubin 0.3 - 1.2 mg/dL - - -  Alkaline Phos 38 - 126 U/L - - -  AST 15 - 41 U/L - - -  ALT 0 - 44 U/L - - -    Lipid Panel  No results found for: CHOL, TRIG, HDL, CHOLHDL, VLDL, LDLCALC, LDLDIRECT, LABVLDL  No components found for: NTPROBNP Recent Labs    03/12/20 0921 03/22/20 0956 03/30/20 0915 05/22/20 1006  PROBNP 1,712* 1,312* 578 422   Recent Labs    01/30/20 1338  TSH 1.554    BMP Recent Labs    03/22/20 0957 03/30/20 0914 05/22/20 1006  NA 141 139 137  K 4.7 4.5 5.0  CL 100 97 101  CO2 27 27 25   GLUCOSE 79 78 83  BUN 14  17 22   CREATININE 0.82 1.09* 1.01*  CALCIUM 9.7 9.8 9.7  GFRNONAA 65 46* 51*  GFRAA 75 53* 59*    HEMOGLOBIN A1C No results found for: HGBA1C, MPG  Cardiac Panel (last 3 results) No results for input(s): CKTOTAL, CKMB, TROPONINIHS, RELINDX in the last 72 hours.  CHOLESTEROL No results for input(s): CHOL in the last 8760 hours.  Hepatic Function Panel Recent Labs    01/30/20 1336 01/31/20 0613 02/05/20 0318 02/06/20 0234 02/07/20 0357  PROT 7.1   < > 5.4* 5.6* 5.9*  ALBUMIN 3.4*   < > 2.3* 2.4* 2.5*  AST 190*   < > 112* 84* 80*  ALT 184*   < > 113* 89* 76*  ALKPHOS 122   < > 91 94 91  BILITOT 2.7*   < > 1.7* 1.8* 1.7*  BILIDIR 1.5*  --   --   --   --   IBILI 1.2*  --   --   --   --    < > = values in this interval not displayed.   Review of Systems  Constitutional: Negative for chills and fever.  HENT: Negative for hoarse voice and nosebleeds.   Eyes: Negative for discharge, double vision and pain.  Cardiovascular: Negative for chest pain, claudication, dyspnea on exertion, leg swelling, near-syncope, orthopnea, palpitations, paroxysmal nocturnal dyspnea and syncope.  Respiratory: Negative for hemoptysis and shortness of breath.   Musculoskeletal: Negative for muscle cramps and myalgias.  Gastrointestinal: Negative for abdominal pain, constipation, diarrhea, hematemesis, hematochezia, melena, nausea and vomiting.  Neurological: Negative for dizziness and light-headedness.       LIMITED Physical Examination:  Vitals:   07/10/20 1033  BP: 100/66  Pulse: 82   (Measured by the patient using a home BP monitor)  Physical Exam: Not performed, as this is a telephone visit (LAST PHYSICAL EXAMINATION 05/15/2020) CONSTITUTIONAL: Frail-appearing elderly female.  No acute distress, hemodynamically stable.    SKIN: Skin is warm and dry. No rash noted. No cyanosis. No pallor. No jaundice HEAD: Normocephalic and atraumatic.  EYES: No scleral icterus MOUTH/THROAT: Moist oral  membranes.  NECK: No JVD present. No thyromegaly noted. No carotid bruits  LYMPHATIC: No visible cervical adenopathy.  CHEST Normal respiratory effort. No intercostal retractions  LUNGS: Decreased breath sounds at the bases.  No stridor. No wheezes. No rales.  CARDIOVASCULAR: Regular, positive S1-S2, holosystolic murmur heard at the apex, no gallops or rubs ABDOMINAL: No apparent ascites, abdominal hernia.  EXTREMITIES: No peripheral edema   HEMATOLOGIC: No significant bruising NEUROLOGIC: Oriented to person, place, and time. Nonfocal. Normal muscle tone.  PSYCHIATRIC: Normal  mood and affect. Normal behavior. Cooperative     IMPRESSION AND PLAN: Anita Martinez is a 86 y.o. female whose past medical history and cardiac risk factors include: Chronic heart failure with reduced EF, cardiomyopathy/stage C/NYHA class II, severe mitral regurgitation, moderate to severe TR, hyperlipidemia, chronic back pain, anxiety, advanced age, postmenopausal female.  Chronic HFrEF, stage C, NYHA class II:  Biventricular heart failure noted during her hospitalization May 2021.  No recurrent hospitalizations for congestive heart failure since May 2021  Medications reconciled.  Continue ivabradine, metoprolol, Entresto.  Patient and her daughter have discussed AICD implantation since last office visit.  They do not want to proceed with AICD evaluation at this time.   Patient's daughter is informed to call the office if she starts to gain weight or has symptoms of shortness of breath at rest or with effort related activities.  Recommend daily weight check, strict I/O's  Fluid restriction to <2L per day, Na restriction < 2g per   Orders Placed This Encounter  Procedures  . Basic metabolic panel  . Pro b natriuretic peptide (BNP)  . Magnesium   --Continue cardiac medications as reconciled in final medication list. --Return in about 3 months (around 10/10/2020) for heart failure management.. Or sooner  if needed. --Continue follow-up with your primary care physician regarding the management of your other chronic comorbid conditions.  Patient's questions and concerns were addressed to her satisfaction. She voices understanding of the instructions provided during this encounter.   This note was created using a voice recognition software as a result there may be grammatical errors inadvertently enclosed that do not reflect the nature of this encounter. Every attempt is made to correct such errors.  Total time spent: 22 minutes.  Tessa Lerner, [place], [facility]  Pager: [phone] Office: [phone]

## 2020-07-11 ENCOUNTER — Other Ambulatory Visit: Payer: Self-pay

## 2020-07-11 DIAGNOSIS — I5022 Chronic systolic (congestive) heart failure: Secondary | ICD-10-CM

## 2020-07-20 LAB — BASIC METABOLIC PANEL
BUN/Creatinine Ratio: 19 (ref 12–28)
BUN: 18 mg/dL (ref 8–27)
CO2: 28 mmol/L (ref 20–29)
Calcium: 9.8 mg/dL (ref 8.7–10.3)
Chloride: 103 mmol/L (ref 96–106)
Creatinine, Ser: 0.96 mg/dL (ref 0.57–1.00)
GFR calc Af Amer: 62 mL/min/{1.73_m2} (ref >59)
GFR calc non Af Amer: 54 mL/min/{1.73_m2} — ABNORMAL LOW (ref >59)
Glucose: 68 mg/dL (ref 65–99)
Potassium: 4.7 mmol/L (ref 3.5–5.2)
Sodium: 143 mmol/L (ref 134–144)

## 2020-07-20 LAB — MAGNESIUM: Magnesium: 2.3 mg/dL (ref 1.6–2.3)

## 2020-07-20 LAB — PRO B NATRIURETIC PEPTIDE: NT-Pro BNP: 442 pg/mL (ref 0–738)

## 2020-07-30 ENCOUNTER — Other Ambulatory Visit: Payer: Self-pay | Admitting: Cardiology

## 2020-07-30 DIAGNOSIS — I5082 Biventricular heart failure: Secondary | ICD-10-CM

## 2020-08-25 ENCOUNTER — Other Ambulatory Visit: Payer: Self-pay | Admitting: Cardiology

## 2020-08-25 DIAGNOSIS — I5082 Biventricular heart failure: Secondary | ICD-10-CM

## 2020-08-31 ENCOUNTER — Telehealth: Payer: Self-pay

## 2020-08-31 ENCOUNTER — Telehealth: Payer: Self-pay | Admitting: Cardiology

## 2020-08-31 NOTE — Telephone Encounter (Signed)
Called patient, Anita Martinez, Anita Martinez

## 2020-08-31 NOTE — Telephone Encounter (Signed)
Daughter wants to speck to MA about her mother

## 2020-08-31 NOTE — Telephone Encounter (Signed)
Pt's daughter called and said her mother has gained weight 4 pounds in 1 week. She asked if torsemide 10 mg could be taken. Per ST take for 2 days and call back monday

## 2020-09-26 DIAGNOSIS — I5020 Unspecified systolic (congestive) heart failure: Secondary | ICD-10-CM | POA: Diagnosis not present

## 2020-09-26 DIAGNOSIS — E785 Hyperlipidemia, unspecified: Secondary | ICD-10-CM | POA: Diagnosis not present

## 2020-09-26 DIAGNOSIS — G43009 Migraine without aura, not intractable, without status migrainosus: Secondary | ICD-10-CM | POA: Diagnosis not present

## 2020-10-10 ENCOUNTER — Ambulatory Visit: Payer: Medicare Other | Admitting: Cardiology

## 2020-10-16 DIAGNOSIS — G4700 Insomnia, unspecified: Secondary | ICD-10-CM | POA: Diagnosis not present

## 2020-10-16 DIAGNOSIS — M961 Postlaminectomy syndrome, not elsewhere classified: Secondary | ICD-10-CM | POA: Diagnosis not present

## 2020-10-16 DIAGNOSIS — Z79891 Long term (current) use of opiate analgesic: Secondary | ICD-10-CM | POA: Diagnosis not present

## 2020-10-16 DIAGNOSIS — G894 Chronic pain syndrome: Secondary | ICD-10-CM | POA: Diagnosis not present

## 2020-10-17 ENCOUNTER — Ambulatory Visit: Payer: Medicare Other | Admitting: Cardiology

## 2020-10-17 ENCOUNTER — Encounter: Payer: Self-pay | Admitting: Cardiology

## 2020-10-17 ENCOUNTER — Other Ambulatory Visit: Payer: Self-pay

## 2020-10-17 VITALS — BP 122/52 | HR 68 | Temp 97.2°F | Ht 65.0 in | Wt 109.0 lb

## 2020-10-17 DIAGNOSIS — I5022 Chronic systolic (congestive) heart failure: Secondary | ICD-10-CM | POA: Diagnosis not present

## 2020-10-17 DIAGNOSIS — E782 Mixed hyperlipidemia: Secondary | ICD-10-CM | POA: Diagnosis not present

## 2020-10-17 DIAGNOSIS — Z87891 Personal history of nicotine dependence: Secondary | ICD-10-CM

## 2020-10-17 NOTE — Progress Notes (Signed)
Anita Martinez Date of Birth: [birth year] MRN: [id number] [place]  Date: 10/17/20 Last Office Visit: 07/10/2020  Chief Complaint  Patient presents with  . heart failure with reduced ejection fraction  . Follow-up    HPI  Anita Martinez is a 87 y.o.  female who presents to the office with a chief complaint of " congestive heart failure management ." Patient's past medical history and cardiovascular risk factors include: Newly diagnosed heart failure with reduced EF, cardiomyopathy, severe mitral regurgitation, moderate to severe TR, hyperlipidemia, chronic back pain, anxiety, advanced age, postmenopausal female.  Patient is accompanied by her daughter, Anita Martinez ([id number]), at today's office visit.    Patient was hospitalized in May 2021 at which time she was treated for acute decompensated biventricular heart failure, hypotension, acute kidney injury and Klebsiella UTI.  Cardiology was consulted for acute decompensated biventricular heart failure.  During that hospitalization patient and her family decided to proceed with medical management in regards to her biventricular heart failure and hold off invasive cardiovascular procedures given her presentation, frailty, chronic comorbidities, age.  Patient is being followed for management of her biventricular heart failure.  Since last office visit patient's daughter states that she has been doing well from a cardiovascular standpoint.  She states that "mom is back to baseline."  Patient is more energetic, is eating 3 balanced meals, and is compliant with her current medical therapy.  No recent hospitalizations for cardiovascular symptoms.  Home blood pressure log reviewed her systolic blood pressures are 120-130 mmHg and her heart rate is ranging between 60-94 bpm.  She is currently on metoprolol and ivabradine.  She has gained approximately 8 pounds since her last visit but this is most likely secondary to  increase in caloric intake.  She denies orthopnea, paroxysmal nocturnal dyspnea or lower extremity swelling.    ALLERGIES: Allergies  Allergen Reactions  . Morphine And Related     Makes hyper and unable to sleep    MEDICATION LIST PRIOR TO VISIT: Current Outpatient Medications on File Prior to Visit  Medication Sig Dispense Refill  . HYDROcodone-acetaminophen (NORCO/VICODIN) 5-325 MG tablet Take 1 tablet by mouth every 6 (six) hours as needed for moderate pain.    . metoprolol tartrate (LOPRESSOR) 25 MG tablet TAKE 1/2 TABLET BY MOUTH TWICE DAILY. HOLD OTL<57 AND SYSTOLIC BLOOD WIOMBTDH<74 30 tablet 3  . Multiple Vitamin (MULTIVITAMIN WITH MINERALS) TABS tablet Take 1 tablet by mouth daily. 30 tablet 1  . sacubitril-valsartan (ENTRESTO) 49-51 MG Take 1 tablet by mouth 2 (two) times daily. 180 tablet 1  . senna-docusate (SENOKOT-S) 8.6-50 MG tablet Take 1 tablet by mouth 2 (two) times daily. 30 tablet 1  . valACYclovir (VALTREX) 500 MG tablet Take 500 mg by mouth as needed.    Mar[place] Kitchen atorvastatin (LIPITOR) 10 MG tablet Take 10 mg by mouth daily.    . feeding supplement, ENSURE ENLIVE, (ENSURE ENLIVE) LIQD Take 237 mLs by mouth 2 (two) times daily between meals. 237 mL 12  . fish oil-omega-3 fatty acids 1000 MG capsule Take 1 g by mouth 2 (two) times daily.     No current facility-administered medications on file prior to visit.    PAST MEDICAL HISTORY: Past Medical History:  Diagnosis Date  . Cardiomyopathy ([place])   . CHF (congestive heart failure) ([place])   . Chronic back pain   . Colon polyps   . Colonic diverticular abscess   . MR (mitral regurgitation)   . TR (tricuspid regurgitation)  PAST SURGICAL HISTORY: Past Surgical History:  Procedure Laterality Date  . ABDOMINAL HYSTERECTOMY    . BLADDER SUSPENSION      FAMILY HISTORY: The patient's family history is not on file. No family history of premature coronary disease or sudden cardiac death.   SOCIAL HISTORY:  The  patient  reports that she quit smoking about 52 years ago. Her smoking use included cigarettes. She started smoking about 66 years ago. She has a 14.00 pack-year smoking history. She has never used smokeless tobacco. She reports that she does not drink alcohol and does not use drugs.  Review of Systems  Constitutional: Positive for weight gain. Negative for chills, fever and malaise/fatigue.  HENT: Positive for hearing loss and tinnitus (chronic and stable.). Negative for hoarse voice and nosebleeds.   Eyes: Negative for discharge, double vision and pain.  Cardiovascular: Negative for chest pain, claudication, dyspnea on exertion, leg swelling, near-syncope, orthopnea, palpitations, paroxysmal nocturnal dyspnea and syncope.  Respiratory: Negative for hemoptysis and shortness of breath.   Musculoskeletal: Negative for muscle cramps and myalgias.       Leg cramps  Gastrointestinal: Negative for abdominal pain, constipation, diarrhea, hematemesis, hematochezia, melena, nausea and vomiting.  Neurological: Negative for dizziness and light-headedness.    PHYSICAL EXAM: Vitals with BMI 10/17/2020 10/17/2020 07/10/2020  Height - '5\' 5"'  '5\' 5"'   Weight - 109 lbs 101 lbs 13 oz  BMI - 67.89 38.10  Systolic 175 102 585  Diastolic 52 35 66  Pulse 68 52 82    CONSTITUTIONAL: Frail-appearing elderly female.  No acute distress, hemodynamically stable.    SKIN: Skin is warm and dry. No rash noted. No cyanosis. No pallor. No jaundice HEAD: Normocephalic and atraumatic.  EYES: No scleral icterus MOUTH/THROAT: Moist oral membranes.  NECK: No JVD present. No thyromegaly noted. No carotid bruits  LYMPHATIC: No visible cervical adenopathy.  CHEST Normal respiratory effort. No intercostal retractions  LUNGS: Decreased breath sounds at the bases.  No stridor. No wheezes. No rales.  CARDIOVASCULAR: Regular, positive I7-P8, holosystolic murmur heard at the apex, no gallops or rubs ABDOMINAL: No apparent ascites,  abdominal hernia.  EXTREMITIES: No peripheral edema   HEMATOLOGIC: No significant bruising. NEUROLOGIC: Oriented to person, place, and time. Nonfocal. Normal muscle tone.  PSYCHIATRIC: Normal mood and affect. Normal behavior. Cooperative  CARDIAC DATABASE: EKG: 02/06/2020: Sinus rhythm with 97 bpm, with sinus arrhythmia and premature supraventricular complexes, LVH per voltage criteria, possible old septal infarct, nonspecific ST-T changes, without underlying injury pattern.  10/17/2020: Sinus bradycardia, 59 bpm, PRWP, consider old anterior infarct, without underlying injury pattern, consider Type II AV block with sinus nodal atrial exist block.  Echocardiogram: 01/31/2020: LVEF less than 20%, severely reduced left ventricular systolic function, global hypokinesis, unable to quantify diastolic. Right ventricular systolic function is severely reduced. The right ventricular size is moderately enlarged. There is mildly elevated pulmonary artery systolic pressure. The estimated right ventricular systolic pressure is 24.2 mmHg. Left atrial size was severely dilated. Right atrial size was severely dilated. Moderate pleural effusion in both left and right lateral regions. The mitral valve is grossly normal. Severe mitral valve regurgitation. Tricuspid valve regurgitation is moderate to severe. The aortic valve is normal in structure. Aortic valve regurgitation is not visualized. No aortic stenosis is present.  05/07/2020:  Left ventricle cavity is mildly dilated. Normal wall thickenss. Severe global hypokinesis. LVEF 20-25%.  Grade 3 diastolic impairment, elevated left atrial pressure, Moderately reduced right ventricular function.  Left atrial cavity is severely dilated at  56 cc/m2. Trileaflet aortic valve with mild aortic valve leaflet calcification. Trace aortic stenosis. No regurgitation.  Moderate (Grade III) mitral regurgitation.  Inadequate TR jet to estimate pulmonary artery systolic pressure.  Normal right atrial pressure.  No significant change compared to previous study on 01/31/2020.  LABORATORY DATA: CBC Latest Ref Rng & Units 02/06/2020 02/05/2020 02/04/2020  WBC 4.0 - 10.5 K/uL 13.4(H) 14.4(H) 15.5(H)  Hemoglobin 12.0 - 15.0 g/dL 13.6 13.5 14.0  Hematocrit 36.0 - 46.0 % 42.0 42.2 43.0  Platelets 150 - 400 K/uL 101(L) 90(L) 84(L)    CMP Latest Ref Rng & Units 07/19/2020 05/22/2020 03/30/2020  Glucose 65 - 99 mg/dL 68 83 78  BUN 8 - 27 mg/dL '18 22 17  ' Creatinine 0.57 - 1.00 mg/dL 0.96 1.01(H) 1.09(H)  Sodium 134 - 144 mmol/L 143 137 139  Potassium 3.5 - 5.2 mmol/L 4.7 5.0 4.5  Chloride 96 - 106 mmol/L 103 101 97  CO2 20 - 29 mmol/L '28 25 27  ' Calcium 8.7 - 10.3 mg/dL 9.8 9.7 9.8  Total Protein 6.5 - 8.1 g/dL - - -  Total Bilirubin 0.3 - 1.2 mg/dL - - -  Alkaline Phos 38 - 126 U/L - - -  AST 15 - 41 U/L - - -  ALT 0 - 44 U/L - - -    Lipid Panel  No results found for: CHOL, HDL, LDLCALC, LDLDIRECT, TRIG, CHOLHDL  No results found for: HGBA1C No components found for: NTPROBNP Lab Results  Component Value Date   TSH 1.554 01/30/2020    Cardiac Panel (last 3 results) No results for input(s): CKTOTAL, CKMB, TROPONINIHS, RELINDX in the last 72 hours.  IMPRESSION:    ICD-10-CM   1. Chronic HFrEF (heart failure with reduced ejection fraction) (HCC)  I50.22 EKG 12-Lead    CMP14+EGFR    Pro b natriuretic peptide (BNP)    Magnesium  2. Mixed hyperlipidemia  E78.2 LDL cholesterol, direct    Lipid Panel With LDL/HDL Ratio  3. Former smoker  Z87.891      RECOMMENDATIONS: Anita Martinez is a 87 y.o. female whose past medical history and cardiovascular risk factors include: biventricular heart failure, stage C, NYHA class II, cardiomyopathy, mixed hyperlipidemia, former smoker, advanced age, postmenopausal female.  Chronic HFrEF, stage C, NYHA class II:  Biventricular heart failure noted during her hospitalization May 2021.  No recurrent hospitalizations for  congestive heart failure  Medications reconciled.  Given the EKG findings will d/c Ivabradine 5 mg p.o. twice daily.  For now will continue Lopressor with holding parameters. If the EKG at the next visit improves and patient has be able to tolerate Lopressor will transition to Toprol XL.     Will consider Aldactone if labs and hemodynamics allow.   Most recent echo results reviewed with the patient. LVEF improved but still severely reduced. Both patient and her daughter do not want to proceed with AICD evaluation at this time.   Patient's daughter is informed to call the office if she starts to gain weight or has symptoms of shortness of breath at rest or with effort related activities.  Recommend daily weight check, strict I/O's  Fluid restriction to <2L per day, Na restriction < 2g per day  Will check labs prior to next office visit.   Mixed hyperlipidemia: Continue statin therapy.  Most recent lipid profile reviewed patient does not endorse myalgias. Check fasting lipids prior to next office visit.   Former smoker: Educated on importance of continued smoking cessation.  FINAL MEDICATION LIST END OF ENCOUNTER: Medications Discontinued During This Encounter  Medication Reason  . CORLANOR 5 MG TABS tablet Discontinued by provider    Current Outpatient Medications:  .  HYDROcodone-acetaminophen (NORCO/VICODIN) 5-325 MG tablet, Take 1 tablet by mouth every 6 (six) hours as needed for moderate pain., Disp: , Rfl:  .  metoprolol tartrate (LOPRESSOR) 25 MG tablet, TAKE 1/2 TABLET BY MOUTH TWICE DAILY. HOLD HKG<67 AND SYSTOLIC BLOOD PCHEKBTC<48, Disp: 30 tablet, Rfl: 3 .  Multiple Vitamin (MULTIVITAMIN WITH MINERALS) TABS tablet, Take 1 tablet by mouth daily., Disp: 30 tablet, Rfl: 1 .  sacubitril-valsartan (ENTRESTO) 49-51 MG, Take 1 tablet by mouth 2 (two) times daily., Disp: 180 tablet, Rfl: 1 .  senna-docusate (SENOKOT-S) 8.6-50 MG tablet, Take 1 tablet by mouth 2 (two) times  daily., Disp: 30 tablet, Rfl: 1 .  valACYclovir (VALTREX) 500 MG tablet, Take 500 mg by mouth as needed., Disp: , Rfl:  .  atorvastatin (LIPITOR) 10 MG tablet, Take 10 mg by mouth daily., Disp: , Rfl:  .  feeding supplement, ENSURE ENLIVE, (ENSURE ENLIVE) LIQD, Take 237 mLs by mouth 2 (two) times daily between meals., Disp: 237 mL, Rfl: 12 .  fish oil-omega-3 fatty acids 1000 MG capsule, Take 1 g by mouth 2 (two) times daily., Disp: , Rfl:   Orders Placed This Encounter  Procedures  . LDL cholesterol, direct  . Lipid Panel With LDL/HDL Ratio  . CMP14+EGFR  . Pro b natriuretic peptide (BNP)  . Magnesium  . EKG 12-Lead   --Continue cardiac medications as reconciled in final medication list. --Return in about 4 weeks (around 11/14/2020) for Follow up, heart failure management., EKG on arrival.. Or sooner if needed. --Continue follow-up with your primary care physician regarding the management of your other chronic comorbid conditions.  Patient's questions and concerns were addressed to her satisfaction. She voices understanding of the instructions provided during this encounter.   This note was created using a voice recognition software as a result there may be grammatical errors inadvertently enclosed that do not reflect the nature of this encounter. Every attempt is made to correct such errors.  Total time spent: 39 minutes.  Rex Kras, [place], [facility]  Pager: [phone] Office: [phone]

## 2020-11-12 DIAGNOSIS — E785 Hyperlipidemia, unspecified: Secondary | ICD-10-CM | POA: Diagnosis not present

## 2020-11-12 DIAGNOSIS — I5020 Unspecified systolic (congestive) heart failure: Secondary | ICD-10-CM | POA: Diagnosis not present

## 2020-11-12 DIAGNOSIS — G43009 Migraine without aura, not intractable, without status migrainosus: Secondary | ICD-10-CM | POA: Diagnosis not present

## 2020-11-18 ENCOUNTER — Other Ambulatory Visit: Payer: Self-pay | Admitting: Cardiology

## 2020-11-23 ENCOUNTER — Other Ambulatory Visit: Payer: Self-pay | Admitting: Cardiology

## 2020-11-23 DIAGNOSIS — I5082 Biventricular heart failure: Secondary | ICD-10-CM

## 2020-11-28 ENCOUNTER — Ambulatory Visit: Payer: Medicare Other | Admitting: Cardiology

## 2020-11-29 DIAGNOSIS — E782 Mixed hyperlipidemia: Secondary | ICD-10-CM | POA: Diagnosis not present

## 2020-11-29 DIAGNOSIS — I5022 Chronic systolic (congestive) heart failure: Secondary | ICD-10-CM | POA: Diagnosis not present

## 2020-11-30 LAB — CMP14+EGFR
ALT: 10 IU/L (ref 0–32)
AST: 20 IU/L (ref 0–40)
Albumin/Globulin Ratio: 1.5 (ref 1.2–2.2)
Albumin: 4.7 g/dL — ABNORMAL HIGH (ref 3.6–4.6)
Alkaline Phosphatase: 59 IU/L (ref 44–121)
BUN/Creatinine Ratio: 19 (ref 12–28)
BUN: 21 mg/dL (ref 8–27)
Bilirubin Total: 0.3 mg/dL (ref 0.0–1.2)
CO2: 23 mmol/L (ref 20–29)
Calcium: 9.7 mg/dL (ref 8.7–10.3)
Chloride: 104 mmol/L (ref 96–106)
Creatinine, Ser: 1.08 mg/dL — ABNORMAL HIGH (ref 0.57–1.00)
Globulin, Total: 3.1 g/dL (ref 1.5–4.5)
Glucose: 89 mg/dL (ref 65–99)
Potassium: 4.9 mmol/L (ref 3.5–5.2)
Sodium: 142 mmol/L (ref 134–144)
Total Protein: 7.8 g/dL (ref 6.0–8.5)
eGFR: 50 mL/min/{1.73_m2} — ABNORMAL LOW (ref >59)

## 2020-11-30 LAB — PRO B NATRIURETIC PEPTIDE: NT-Pro BNP: 211 pg/mL (ref 0–738)

## 2020-11-30 LAB — MAGNESIUM: Magnesium: 2.5 mg/dL — ABNORMAL HIGH (ref 1.6–2.3)

## 2020-11-30 LAB — LDL CHOLESTEROL, DIRECT: LDL Direct: 81 mg/dL (ref 0–99)

## 2020-11-30 LAB — LIPID PANEL WITH LDL/HDL RATIO
Cholesterol, Total: 197 mg/dL (ref 100–199)
HDL: 46 mg/dL (ref >39)
LDL Chol Calc (NIH): 99 mg/dL (ref 0–99)
LDL/HDL Ratio: 2.2 ratio (ref 0.0–3.2)
Triglycerides: 310 mg/dL — ABNORMAL HIGH (ref 0–149)
VLDL Cholesterol Cal: 52 mg/dL — ABNORMAL HIGH (ref 5–40)

## 2020-12-03 ENCOUNTER — Telehealth: Payer: Self-pay

## 2020-12-03 NOTE — Telephone Encounter (Signed)
-----   Message from [place], [place] sent at 12/02/2020  8:38 PM EDT ----- We will review the results at the upcoming office visit.

## 2020-12-04 ENCOUNTER — Other Ambulatory Visit: Payer: Self-pay

## 2020-12-04 ENCOUNTER — Encounter: Payer: Self-pay | Admitting: Cardiology

## 2020-12-04 ENCOUNTER — Ambulatory Visit: Payer: Medicare Other | Admitting: Cardiology

## 2020-12-04 VITALS — BP 108/40 | HR 55 | Temp 97.8°F | Resp 16 | Ht 65.0 in | Wt 113.0 lb

## 2020-12-04 DIAGNOSIS — Z87891 Personal history of nicotine dependence: Secondary | ICD-10-CM | POA: Diagnosis not present

## 2020-12-04 DIAGNOSIS — I429 Cardiomyopathy, unspecified: Secondary | ICD-10-CM | POA: Diagnosis not present

## 2020-12-04 DIAGNOSIS — E782 Mixed hyperlipidemia: Secondary | ICD-10-CM

## 2020-12-04 DIAGNOSIS — I5022 Chronic systolic (congestive) heart failure: Secondary | ICD-10-CM

## 2020-12-04 MED ORDER — METOPROLOL SUCCINATE ER 25 MG PO TB24: 25.0000 mg | ORAL_TABLET | Freq: Every morning | ORAL | 0 refills | Status: DC

## 2020-12-04 NOTE — Progress Notes (Signed)
Anita Martinez Date of Birth: 01-24-1933 MRN: [id number] Bee  Date: 12/04/20 Last Office Visit: 10/17/2020  Chief Complaint  Patient presents with  . Congestive Heart Failure  . Follow-up    4 week  . Cardiomyopathy    HPI  Anita Martinez is a 87 y.o.  female who presents to the office with a chief complaint of " 3-mon congestive heart failure management ." Patient's past medical history and cardiovascular risk factors include: Newly diagnosed heart failure with reduced EF, cardiomyopathy, severe mitral regurgitation, moderate to severe TR, hyperlipidemia, chronic back pain, anxiety, advanced age, postmenopausal female.  Patient is accompanied by her daughter, Jackelyn Poling ([phone]), at today's office visit.    Patient was hospitalized in May 2021 at which time she was treated for acute decompensated biventricular heart failure, hypotension, acute kidney injury and Klebsiella UTI.  Cardiology was consulted for acute decompensated biventricular heart failure.  During that hospitalization patient and her family decided to proceed with medical management in regards to her biventricular heart failure and hold off invasive cardiovascular procedures given her presentation, frailty, chronic comorbidities, age.  Patient has been uptitrated on GDMT in a stepwise fashion and she has tolerated the medications well without any significant side effects or intolerances.  During the last office visit we had discontinued ivabradine and her metoprolol was reduced to 12.5 mg p.o. twice daily.  Since last office visit patient has been doing well from a cardiovascular standpoint.  She has gained 4 pounds due to increased caloric intake.  She does not complain of heart failure symptoms.  Home blood pressure log reviewed systolic blood pressures are ranging around 841 mmHg and diastolic blood pressures between 70-78 mmHg.  And pulse is greater than 60 bpm.  Most recent  labs independently reviewed with the patient and her daughter at today's visit.  Patient's daughter states that she does have increased consumption of breads, muffins, and granola bars.  That may be contributing to her high TAG levels.  ALLERGIES: Allergies  Allergen Reactions  . Morphine And Related     Makes hyper and unable to sleep    MEDICATION LIST PRIOR TO VISIT: Current Outpatient Medications on File Prior to Visit  Medication Sig Dispense Refill  . atorvastatin (LIPITOR) 10 MG tablet Take 10 mg by mouth daily.    Marland Kitchen ENTRESTO 49-51 MG TAKE 1 TABLET BY MOUTH TWICE DAILY 180 tablet 1  . feeding supplement, ENSURE ENLIVE, (ENSURE ENLIVE) LIQD Take 237 mLs by mouth 2 (two) times daily between meals. 237 mL 12  . fish oil-omega-3 fatty acids 1000 MG capsule Take 1 g by mouth 2 (two) times daily.    Marland Kitchen HYDROcodone-acetaminophen (NORCO/VICODIN) 5-325 MG tablet Take 1 tablet by mouth every 6 (six) hours as needed for moderate pain.    . Multiple Vitamin (MULTIVITAMIN WITH MINERALS) TABS tablet Take 1 tablet by mouth daily. 30 tablet 1  . senna-docusate (SENOKOT-S) 8.6-50 MG tablet Take 1 tablet by mouth 2 (two) times daily. 30 tablet 1  . valACYclovir (VALTREX) 500 MG tablet Take 500 mg by mouth as needed.    Marland Kitchen acetaminophen (ACETAMINOPHEN EXTRA STRENGTH) 500 MG tablet as needed.     No current facility-administered medications on file prior to visit.    PAST MEDICAL HISTORY: Past Medical History:  Diagnosis Date  . Cardiomyopathy ([place])   . CHF (congestive heart failure) ([place])   . Chronic back pain   . Colon polyps   . Colonic diverticular abscess   .  MR (mitral regurgitation)   . TR (tricuspid regurgitation)     PAST SURGICAL HISTORY: Past Surgical History:  Procedure Laterality Date  . ABDOMINAL HYSTERECTOMY    . BLADDER SUSPENSION      FAMILY HISTORY: The patient's family history is not on file. No family history of premature coronary disease or sudden cardiac death.    SOCIAL HISTORY:  The patient  reports that she quit smoking about 52 years ago. Her smoking use included cigarettes. She started smoking about 66 years ago. She has a 14.00 pack-year smoking history. She has never used smokeless tobacco. She reports that she does not drink alcohol and does not use drugs.  Review of Systems  Constitutional: Positive for weight gain. Negative for chills, fever and malaise/fatigue.  HENT: Positive for hearing loss and tinnitus (chronic and stable.). Negative for hoarse voice and nosebleeds.   Eyes: Negative for discharge, double vision and pain.  Cardiovascular: Negative for chest pain, claudication, dyspnea on exertion, leg swelling, near-syncope, orthopnea, palpitations, paroxysmal nocturnal dyspnea and syncope.  Respiratory: Negative for hemoptysis and shortness of breath.   Musculoskeletal: Negative for muscle cramps and myalgias.       Leg cramps  Gastrointestinal: Negative for abdominal pain, constipation, diarrhea, hematemesis, hematochezia, melena, nausea and vomiting.  Neurological: Negative for dizziness and light-headedness.    PHYSICAL EXAM: Vitals with BMI 12/04/2020 10/17/2020 10/17/2020  Height '5\' 5"'  - '5\' 5"'   Weight 113 lbs - 109 lbs  BMI 11.5 - 72.62  Systolic 035 597 416  Diastolic 40 52 35  Pulse 55 68 52    CONSTITUTIONAL: Frail-appearing elderly female.  No acute distress, hemodynamically stable.    SKIN: Skin is warm and dry. No rash noted. No cyanosis. No pallor. No jaundice HEAD: Normocephalic and atraumatic.  EYES: No scleral icterus MOUTH/THROAT: Moist oral membranes.  NECK: No JVD present. No thyromegaly noted. No carotid bruits  LYMPHATIC: No visible cervical adenopathy.  CHEST Normal respiratory effort. No intercostal retractions  LUNGS: Clear to auscultation bilaterally.  No stridor. No wheezes. No rales.  CARDIOVASCULAR: Regular, positive L8-G5, holosystolic murmur heard at the apex, no gallops or rubs ABDOMINAL: No  apparent ascites, abdominal hernia.  EXTREMITIES: No peripheral edema   HEMATOLOGIC: No significant bruising. NEUROLOGIC: Oriented to person, place, and time. Nonfocal. Normal muscle tone.  PSYCHIATRIC: Normal mood and affect. Normal behavior. Cooperative  CARDIAC DATABASE: EKG: 12/04/2020: Normal sinus rhythm, 62 bpm, frequent PACs, consider old anteroseptal infarct, without underlying injury pattern.  Echocardiogram: 01/31/2020: LVEF less than 20%, severely reduced left ventricular systolic function, global hypokinesis, unable to quantify diastolic. Right ventricular systolic function is severely reduced. The right ventricular size is moderately enlarged. There is mildly elevated pulmonary artery systolic pressure. The estimated right ventricular systolic pressure is 36.4 mmHg. Left atrial size was severely dilated. Right atrial size was severely dilated. Moderate pleural effusion in both left and right lateral regions. The mitral valve is grossly normal. Severe mitral valve regurgitation. Tricuspid valve regurgitation is moderate to severe. The aortic valve is normal in structure. Aortic valve regurgitation is not visualized. No aortic stenosis is present.  05/07/2020:  Left ventricle cavity is mildly dilated. Normal wall thickenss. Severe global hypokinesis. LVEF 20-25%.  Grade 3 diastolic impairment, elevated left atrial pressure, Moderately reduced right ventricular function.  Left atrial cavity is severely dilated at 56 cc/m2. Trileaflet aortic valve with mild aortic valve leaflet calcification. Trace aortic stenosis. No regurgitation.  Moderate (Grade III) mitral regurgitation.  Inadequate TR jet to estimate pulmonary  artery systolic pressure. Normal right atrial pressure.  No significant change compared to previous study on 01/31/2020.  LABORATORY DATA: CBC Latest Ref Rng & Units 02/06/2020 02/05/2020 02/04/2020  WBC 4.0 - 10.5 K/uL 13.4(H) 14.4(H) 15.5(H)  Hemoglobin 12.0 - 15.0 g/dL 13.6  13.5 14.0  Hematocrit 36.0 - 46.0 % 42.0 42.2 43.0  Platelets 150 - 400 K/uL 101(L) 90(L) 84(L)    CMP Latest Ref Rng & Units 11/29/2020 07/19/2020 05/22/2020  Glucose 65 - 99 mg/dL 89 68 83  BUN 8 - 27 mg/dL '21 18 22  ' Creatinine 0.57 - 1.00 mg/dL 1.08(H) 0.96 1.01(H)  Sodium 134 - 144 mmol/L 142 143 137  Potassium 3.5 - 5.2 mmol/L 4.9 4.7 5.0  Chloride 96 - 106 mmol/L 104 103 101  CO2 20 - 29 mmol/L '23 28 25  ' Calcium 8.7 - 10.3 mg/dL 9.7 9.8 9.7  Total Protein 6.0 - 8.5 g/dL 7.8 - -  Total Bilirubin 0.0 - 1.2 mg/dL 0.3 - -  Alkaline Phos 44 - 121 IU/L 59 - -  AST 0 - 40 IU/L 20 - -  ALT 0 - 32 IU/L 10 - -    Lipid Panel  Lab Results  Component Value Date   CHOL 197 11/29/2020   HDL 46 11/29/2020   LDLCALC 99 11/29/2020   LDLDIRECT 81 11/29/2020   TRIG 310 (H) 11/29/2020    No results found for: HGBA1C No components found for: NTPROBNP Lab Results  Component Value Date   TSH 1.554 01/30/2020    Cardiac Panel (last 3 results) No results for input(s): CKTOTAL, CKMB, TROPONINIHS, RELINDX in the last 72 hours.  IMPRESSION:    ICD-10-CM   1. Chronic HFrEF (heart failure with reduced ejection fraction) (HCC)  I50.22 EKG 12-Lead    metoprolol succinate (TOPROL XL) 25 MG 24 hr tablet    Pro b natriuretic peptide (BNP)    Magnesium    CMP14+EGFR    CMP14+EGFR    Magnesium    Pro b natriuretic peptide (BNP)  2. Cardiomyopathy, unspecified type (HCC)  I42.9 metoprolol succinate (TOPROL XL) 25 MG 24 hr tablet  3. Mixed hyperlipidemia  E78.2 Lipid Panel With LDL/HDL Ratio    LDL cholesterol, direct    LDL cholesterol, direct    Lipid Panel With LDL/HDL Ratio  4. Former smoker  Z87.891      RECOMMENDATIONS: ADRIENNA KARIS is a 87 y.o. female whose past medical history and cardiovascular risk factors include: biventricular heart failure, stage C, NYHA class II, cardiomyopathy, mixed hyperlipidemia, former smoker, advanced age, postmenopausal female.  Chronic HFrEF,  stage C, NYHA class II:  Biventricular heart failure noted during her hospitalization May 2021.  No recurrent hospitalizations for congestive heart failure  Medications reconciled.  We will transition her from Lopressor to Toprol-XL.  Will consider Aldactone if labs and hemodynamics allow.   Most recent echo results reviewed with the patient. LVEF improved but still severely reduced. Both patient and her daughter do not want to proceed with AICD evaluation at this time.   Patient's daughter is informed to call the office if she starts to gain weight or has symptoms of shortness of breath at rest or with effort related activities.  Recommend daily weight check, strict I/O's  Fluid restriction to <2L per day, Na restriction < 2g per day  Will check labs prior to next office visit.   Mixed hyperlipidemia: Continue statin therapy.  Most recent lipid profile reviewed patient does not endorse myalgias. Check fasting  lipids prior to next office visit.   Hypertriglyceridemia: We discussed initiating pharmacological therapy.  However, given her age and her current diet this shared decision was to implement lifestyle changes prior to considering pharmacological therapy.  We will check a fasting lipid profile prior to her next office visit and we discussed this at the next encounter.  Former smoker: Educated on importance of continued smoking cessation.  Both the patient and her daughter are very thankful for the care provided given her complex cardiac conditions as noted above.  FINAL MEDICATION LIST END OF ENCOUNTER: Medications Discontinued During This Encounter  Medication Reason  . metoprolol tartrate (LOPRESSOR) 25 MG tablet Dose change    Current Outpatient Medications:  .  atorvastatin (LIPITOR) 10 MG tablet, Take 10 mg by mouth daily., Disp: , Rfl:  .  ENTRESTO 49-51 MG, TAKE 1 TABLET BY MOUTH TWICE DAILY, Disp: 180 tablet, Rfl: 1 .  feeding supplement, ENSURE ENLIVE, (ENSURE  ENLIVE) LIQD, Take 237 mLs by mouth 2 (two) times daily between meals., Disp: 237 mL, Rfl: 12 .  fish oil-omega-3 fatty acids 1000 MG capsule, Take 1 g by mouth 2 (two) times daily., Disp: , Rfl:  .  HYDROcodone-acetaminophen (NORCO/VICODIN) 5-325 MG tablet, Take 1 tablet by mouth every 6 (six) hours as needed for moderate pain., Disp: , Rfl:  .  metoprolol succinate (TOPROL XL) 25 MG 24 hr tablet, Take 1 tablet (25 mg total) by mouth every morning. Hold if top blood pressure number less than 100 mmHg or heart rate less than 60 bpm., Disp: 90 tablet, Rfl: 0 .  Multiple Vitamin (MULTIVITAMIN WITH MINERALS) TABS tablet, Take 1 tablet by mouth daily., Disp: 30 tablet, Rfl: 1 .  senna-docusate (SENOKOT-S) 8.6-50 MG tablet, Take 1 tablet by mouth 2 (two) times daily., Disp: 30 tablet, Rfl: 1 .  valACYclovir (VALTREX) 500 MG tablet, Take 500 mg by mouth as needed., Disp: , Rfl:  .  acetaminophen (ACETAMINOPHEN EXTRA STRENGTH) 500 MG tablet, as needed., Disp: , Rfl:   Orders Placed This Encounter  Procedures  . Pro b natriuretic peptide (BNP)  . Magnesium  . CMP14+EGFR  . Lipid Panel With LDL/HDL Ratio  . LDL cholesterol, direct  . EKG 12-Lead   --Continue cardiac medications as reconciled in final medication list. --Return in about 4 months (around 03/21/2021) for Follow up, heart failure management.. Or sooner if needed. --Continue follow-up with your primary care physician regarding the management of your other chronic comorbid conditions.  Patient's questions and concerns were addressed to her satisfaction. She voices understanding of the instructions provided during this encounter.   This note was created using a voice recognition software as a result there may be grammatical errors inadvertently enclosed that do not reflect the nature of this encounter. Every attempt is made to correct such errors.  Rex Kras, [place], [facility]  Pager: [phone] Office: [phone]

## 2020-12-11 DIAGNOSIS — G894 Chronic pain syndrome: Secondary | ICD-10-CM | POA: Diagnosis not present

## 2020-12-11 DIAGNOSIS — Z79891 Long term (current) use of opiate analgesic: Secondary | ICD-10-CM | POA: Diagnosis not present

## 2020-12-11 DIAGNOSIS — G4700 Insomnia, unspecified: Secondary | ICD-10-CM | POA: Diagnosis not present

## 2020-12-11 DIAGNOSIS — M961 Postlaminectomy syndrome, not elsewhere classified: Secondary | ICD-10-CM | POA: Diagnosis not present

## 2020-12-14 DIAGNOSIS — I5020 Unspecified systolic (congestive) heart failure: Secondary | ICD-10-CM | POA: Diagnosis not present

## 2020-12-14 DIAGNOSIS — G43009 Migraine without aura, not intractable, without status migrainosus: Secondary | ICD-10-CM | POA: Diagnosis not present

## 2020-12-14 DIAGNOSIS — E785 Hyperlipidemia, unspecified: Secondary | ICD-10-CM | POA: Diagnosis not present

## 2021-01-30 DIAGNOSIS — E785 Hyperlipidemia, unspecified: Secondary | ICD-10-CM | POA: Diagnosis not present

## 2021-01-30 DIAGNOSIS — I5020 Unspecified systolic (congestive) heart failure: Secondary | ICD-10-CM | POA: Diagnosis not present

## 2021-01-30 DIAGNOSIS — G43009 Migraine without aura, not intractable, without status migrainosus: Secondary | ICD-10-CM | POA: Diagnosis not present

## 2021-03-05 DIAGNOSIS — I5022 Chronic systolic (congestive) heart failure: Secondary | ICD-10-CM | POA: Diagnosis not present

## 2021-03-05 DIAGNOSIS — E782 Mixed hyperlipidemia: Secondary | ICD-10-CM | POA: Diagnosis not present

## 2021-03-06 LAB — CMP14+EGFR
ALT: 8 IU/L (ref 0–32)
AST: 18 IU/L (ref 0–40)
Albumin/Globulin Ratio: 1.5 (ref 1.2–2.2)
Albumin: 4.6 g/dL (ref 3.6–4.6)
Alkaline Phosphatase: 55 IU/L (ref 44–121)
BUN/Creatinine Ratio: 17 (ref 12–28)
BUN: 19 mg/dL (ref 8–27)
Bilirubin Total: 0.4 mg/dL (ref 0.0–1.2)
CO2: 23 mmol/L (ref 20–29)
Calcium: 10.1 mg/dL (ref 8.7–10.3)
Chloride: 102 mmol/L (ref 96–106)
Creatinine, Ser: 1.11 mg/dL — ABNORMAL HIGH (ref 0.57–1.00)
Globulin, Total: 3.1 g/dL (ref 1.5–4.5)
Glucose: 92 mg/dL (ref 65–99)
Potassium: 4.7 mmol/L (ref 3.5–5.2)
Sodium: 138 mmol/L (ref 134–144)
Total Protein: 7.7 g/dL (ref 6.0–8.5)
eGFR: 48 mL/min/{1.73_m2} — ABNORMAL LOW (ref >59)

## 2021-03-06 LAB — LIPID PANEL WITH LDL/HDL RATIO
Cholesterol, Total: 169 mg/dL (ref 100–199)
HDL: 43 mg/dL (ref >39)
LDL Chol Calc (NIH): 91 mg/dL (ref 0–99)
LDL/HDL Ratio: 2.1 ratio (ref 0.0–3.2)
Triglycerides: 204 mg/dL — ABNORMAL HIGH (ref 0–149)
VLDL Cholesterol Cal: 35 mg/dL (ref 5–40)

## 2021-03-06 LAB — PRO B NATRIURETIC PEPTIDE: NT-Pro BNP: 145 pg/mL (ref 0–738)

## 2021-03-06 LAB — MAGNESIUM: Magnesium: 2.4 mg/dL — ABNORMAL HIGH (ref 1.6–2.3)

## 2021-03-06 LAB — LDL CHOLESTEROL, DIRECT: LDL Direct: 78 mg/dL (ref 0–99)

## 2021-03-12 ENCOUNTER — Other Ambulatory Visit: Payer: Self-pay

## 2021-03-12 ENCOUNTER — Ambulatory Visit: Payer: Medicare Other | Admitting: Cardiology

## 2021-03-12 ENCOUNTER — Encounter: Payer: Self-pay | Admitting: Cardiology

## 2021-03-12 VITALS — BP 122/46 | HR 54 | Temp 97.3°F | Resp 17 | Ht 65.0 in | Wt 112.4 lb

## 2021-03-12 DIAGNOSIS — Z87891 Personal history of nicotine dependence: Secondary | ICD-10-CM | POA: Diagnosis not present

## 2021-03-12 DIAGNOSIS — I5022 Chronic systolic (congestive) heart failure: Secondary | ICD-10-CM | POA: Diagnosis not present

## 2021-03-12 DIAGNOSIS — G894 Chronic pain syndrome: Secondary | ICD-10-CM | POA: Diagnosis not present

## 2021-03-12 DIAGNOSIS — M961 Postlaminectomy syndrome, not elsewhere classified: Secondary | ICD-10-CM | POA: Diagnosis not present

## 2021-03-12 DIAGNOSIS — I429 Cardiomyopathy, unspecified: Secondary | ICD-10-CM | POA: Diagnosis not present

## 2021-03-12 DIAGNOSIS — E782 Mixed hyperlipidemia: Secondary | ICD-10-CM | POA: Diagnosis not present

## 2021-03-12 DIAGNOSIS — G4700 Insomnia, unspecified: Secondary | ICD-10-CM | POA: Diagnosis not present

## 2021-03-12 DIAGNOSIS — Z79891 Long term (current) use of opiate analgesic: Secondary | ICD-10-CM | POA: Diagnosis not present

## 2021-03-12 MED ORDER — METOPROLOL SUCCINATE ER 25 MG PO TB24: 25.0000 mg | ORAL_TABLET | Freq: Every morning | ORAL | 0 refills | Status: DC

## 2021-03-12 NOTE — Progress Notes (Signed)
Verta Cecelia Date of Birth: 1933/09/07 MRN: [id number] Primary Care Provider:Varadarajan, Rupashree  Date: 03/12/21 Last Office Visit: 12/04/2020   Chief Complaint  Patient presents with   Follow-up    4 month   Congestive Heart Failure    HPI  Anita Martinez is a 87 y.o.  female who presents to the office with a chief complaint of " 62-month follow-up for congestive heart failure management." Patient's past medical history and cardiovascular risk factors include: Newly diagnosed heart failure with reduced EF, cardiomyopathy, severe mitral regurgitation, moderate to severe TR, hyperlipidemia, chronic back pain, anxiety, advanced age, postmenopausal female.  Patient is accompanied by her daughter, Anita Martinez ([id number]), at today's office visit.    Establish care back in May 2021 when she was treated for acute decompensated biventricular heart failure, hypotension, and acute kidney injury due to Klebsiella UTI.  During her episode of biventricular heart failure patient as well as family decided not to proceed with any invasive cardiovascular procedures given her presentation, frailty, comorbidities, and age.  She was managed medically.  Since discharge she has not had any real hospitalizations for congestive heart failure.  Her GDMT has been uptitrated in a stepwise fashion.  Since last office visit she remains asymptomatic from heart failure symptoms.  Her weight has relatively remained stable.  Recent labs from 03/05/2021 independently reviewed with both the patient and daughter at today's office visit.  ALLERGIES: Allergies  Allergen Reactions   Morphine And Related     Makes hyper and unable to sleep    MEDICATION LIST PRIOR TO VISIT: Current Outpatient Medications on File Prior to Visit  Medication Sig Dispense Refill   acetaminophen (TYLENOL) 500 MG tablet as needed.     atorvastatin (LIPITOR) 10 MG tablet Take 10 mg by mouth daily.     ENTRESTO 49-51 MG TAKE 1 TABLET BY  MOUTH TWICE DAILY 180 tablet 1   feeding supplement, ENSURE ENLIVE, (ENSURE ENLIVE) LIQD Take 237 mLs by mouth 2 (two) times daily between meals. 237 mL 12   fish oil-omega-3 fatty acids 1000 MG capsule Take 1 g by mouth 2 (two) times daily.     HYDROcodone-acetaminophen (NORCO/VICODIN) 5-325 MG tablet Take 1 tablet by mouth every 6 (six) hours as needed for moderate pain.     Multiple Vitamin (MULTIVITAMIN WITH MINERALS) TABS tablet Take 1 tablet by mouth daily. 30 tablet 1   valACYclovir (VALTREX) 500 MG tablet Take 500 mg by mouth as needed.     No current facility-administered medications on file prior to visit.    PAST MEDICAL HISTORY: Past Medical History:  Diagnosis Date   Cardiomyopathy (HCC)    CHF (congestive heart failure) (HCC)    Chronic back pain    Colon polyps    Colonic diverticular abscess    MR (mitral regurgitation)    TR (tricuspid regurgitation)     PAST SURGICAL HISTORY: Past Surgical History:  Procedure Laterality Date   ABDOMINAL HYSTERECTOMY     BLADDER SUSPENSION      FAMILY HISTORY: The patient's family history is not on file. No family history of premature coronary disease or sudden cardiac death.   SOCIAL HISTORY:  The patient  reports that she quit smoking about 52 years ago. Her smoking use included cigarettes. She started smoking about 66 years ago. She has a 14.00 pack-year smoking history. She has never used smokeless tobacco. She reports that she does not drink alcohol and does not use drugs.  Review of Systems  Constitutional: Negative for chills, fever, malaise/fatigue and weight gain.  HENT:  Positive for hearing loss and tinnitus (chronic and stable.). Negative for hoarse voice and nosebleeds.   Eyes:  Negative for discharge, double vision and pain.  Cardiovascular:  Negative for chest pain, claudication, dyspnea on exertion, leg swelling, near-syncope, orthopnea, palpitations, paroxysmal nocturnal dyspnea and syncope.  Respiratory:   Negative for hemoptysis and shortness of breath.   Musculoskeletal:  Negative for muscle cramps and myalgias.  Gastrointestinal:  Negative for abdominal pain, constipation, diarrhea, hematemesis, hematochezia, melena, nausea and vomiting.  Neurological:  Negative for dizziness and light-headedness.   PHYSICAL EXAM: Vitals with BMI 03/12/2021 03/12/2021 12/04/2020  Height - 5\' 5"  5\' 5"   Weight - 112 lbs 6 oz 113 lbs  BMI - 18.7 18.8  Systolic 122 99 108  Diastolic 46 62 40  Pulse 54 77 55    CONSTITUTIONAL: Frail-appearing elderly female.  No acute distress, hemodynamically stable.    SKIN: Skin is warm and dry. No rash noted. No cyanosis. No pallor. No jaundice HEAD: Normocephalic and atraumatic.  EYES: No scleral icterus MOUTH/THROAT: Moist oral membranes.  NECK: No JVD present. No thyromegaly noted. No carotid bruits  LYMPHATIC: No visible cervical adenopathy.  CHEST Normal respiratory effort. No intercostal retractions  LUNGS: Clear to auscultation bilaterally.  No stridor. No wheezes. No rales.  CARDIOVASCULAR: Regular, positive S1-S2, holosystolic murmur heard at the apex, no gallops or rubs ABDOMINAL: No apparent ascites, abdominal hernia.  EXTREMITIES: No peripheral edema   HEMATOLOGIC: No significant bruising. NEUROLOGIC: Oriented to person, place, and time. Nonfocal. Normal muscle tone.  PSYCHIATRIC: Normal mood and affect. Normal behavior. Cooperative  CARDIAC DATABASE: EKG: 12/04/2020: Normal sinus rhythm, 62 bpm, frequent PACs, consider old anteroseptal infarct, without underlying injury pattern.  Echocardiogram: 01/31/2020: LVEF less than 20%, severely reduced left ventricular systolic function, global hypokinesis, unable to quantify diastolic. Right ventricular systolic function is severely reduced. The right ventricular size is moderately enlarged. There is mildly elevated pulmonary artery systolic pressure. The estimated right ventricular systolic pressure is 36.5  mmHg. Left atrial size was severely dilated. Right atrial size was severely dilated. Moderate pleural effusion in both left and right lateral regions. The mitral valve is grossly normal. Severe mitral valve regurgitation. Tricuspid valve regurgitation is moderate to severe. The aortic valve is normal in structure. Aortic valve regurgitation is not visualized. No aortic stenosis is present.  05/07/2020:  Left ventricle cavity is mildly dilated. Normal wall thickenss. Severe global hypokinesis. LVEF 20-25%.  Grade 3 diastolic impairment, elevated left atrial pressure, Moderately reduced right ventricular function.  Left atrial cavity is severely dilated at 56 cc/m2. Trileaflet aortic valve with mild aortic valve leaflet calcification. Trace aortic stenosis. No regurgitation.  Moderate (Grade III) mitral regurgitation.  Inadequate TR jet to estimate pulmonary artery systolic pressure. Normal right atrial pressure.  No significant change compared to previous study on 01/31/2020.  LABORATORY DATA: CBC Latest Ref Rng & Units 02/06/2020 02/05/2020 02/04/2020  WBC 4.0 - 10.5 K/uL 13.4(H) 14.4(H) 15.5(H)  Hemoglobin 12.0 - 15.0 g/dL 02/07/2020 02/06/2020 92.4  Hematocrit 36.0 - 46.0 % 42.0 42.2 43.0  Platelets 150 - 400 K/uL 101(L) 90(L) 84(L)    CMP Latest Ref Rng & Units 03/05/2021 11/29/2020 07/19/2020  Glucose 65 - 99 mg/dL 92 89 68  BUN 8 - 27 mg/dL 19 21 18   Creatinine 0.57 - 1.00 mg/dL 01/29/2021) 07/21/2020)  Sodium 134 - 144 mmol/L 138 142 143  Potassium 3.5 - 5.2 mmol/L 4.7 4.9  4.7  Chloride 96 - 106 mmol/L 102 104 103  CO2 20 - 29 mmol/L 23 23 28   Calcium 8.7 - 10.3 mg/dL 9.7 9.8  Total Protein 6.0 - 8.5 g/dL 7.7 7.8 -  Total Bilirubin 0.0 - 1.2 mg/dL 0.4 0.3 -  Alkaline Phos 44 - 121 IU/L 55 59 -  AST 0 - 40 IU/L 18 20 -  ALT 0 - 32 IU/L 8 10 -    Lipid Panel  Lab Results  Component Value Date   CHOL 169 03/05/2021   HDL 43 03/05/2021   LDLCALC 91 03/05/2021   LDLDIRECT 78 03/05/2021   TRIG  204 (H) 03/05/2021    No results found for: HGBA1C No components found for: NTPROBNP Lab Results  Component Value Date   TSH 1.554 01/30/2020    Cardiac Panel (last 3 results) No results for input(s): CKTOTAL, CKMB, TROPONINIHS, RELINDX in the last 72 hours.  IMPRESSION:    ICD-10-CM   1. Chronic HFrEF (heart failure with reduced ejection fraction) (HCC)  I50.22 metoprolol succinate (TOPROL XL) 25 MG 24 hr tablet    ECHOCARDIOGRAM COMPLETE    2. Cardiomyopathy, unspecified type (HCC)  I42.9 metoprolol succinate (TOPROL XL) 25 MG 24 hr tablet    3. Mixed hyperlipidemia  E78.2     4. Former smoker  Z87.891        RECOMMENDATIONS: LARIZA COTHRON is a 87 y.o. female whose past medical history and cardiovascular risk factors include: biventricular heart failure, stage C, NYHA class II, cardiomyopathy, mixed hyperlipidemia, former smoker, advanced age, postmenopausal female.  Chronic HFrEF, stage C, NYHA class II: Biventricular heart failure noted during her hospitalization May 2021. No recurrent hospitalizations for congestive heart failure Medications reconciled. Transition her from Lopressor to Toprol-XL. We discussed either further up titration of Entresto or adding Aldactone.  However the patient's daughter appears to be apprehensive to changing medications at this time as patient has remained relatively stable with her current GDMT.   Shared decision was to hold off additional up titration of medical therapy until her echocardiogram is performed in August 2022.  If her LVEF has not improved significantly at that point we will further uptitrate her medical therapy. Patient/Anita Martinez has decided not to proceed with AICD evaluation given her advanced age, frailty, and other chronic comorbid conditions. Patient's daughter is informed to call the office if she starts to gain weight or has symptoms of shortness of breath at rest or with effort related activities. Recommend daily weight  check, strict I/O's Fluid restriction to <2L per day, Na restriction < 2g per day Will check labs prior to next office visit.   Mixed hyperlipidemia:  Continue statin therapy.   Most recent lipid profile reviewed patient does not endorse myalgias.   Hypertriglyceridemia: Improved. Currently not at goal. Patient would like to continue with lifestyle changes as opposed to addition of pharmacological therapy.  Former smoker: Educated on importance of continued smoking cessation.  Both the patient and her daughter are very thankful for the care provided given her complex cardiac conditions as noted above.  FINAL MEDICATION LIST END OF ENCOUNTER: Medications Discontinued During This Encounter  Medication Reason   senna-docusate (SENOKOT-S) 8.6-50 MG tablet Error   metoprolol succinate (TOPROL XL) 25 MG 24 hr tablet Reorder    Current Outpatient Medications:    acetaminophen (TYLENOL) 500 MG tablet, as needed., Disp: , Rfl:    atorvastatin (LIPITOR) 10 MG tablet, Take 10 mg by mouth daily., Disp: , Rfl:  ENTRESTO 49-51 MG, TAKE 1 TABLET BY MOUTH TWICE DAILY, Disp: 180 tablet, Rfl: 1   feeding supplement, ENSURE ENLIVE, (ENSURE ENLIVE) LIQD, Take 237 mLs by mouth 2 (two) times daily between meals., Disp: 237 mL, Rfl: 12   fish oil-omega-3 fatty acids 1000 MG capsule, Take 1 g by mouth 2 (two) times daily., Disp: , Rfl:    HYDROcodone-acetaminophen (NORCO/VICODIN) 5-325 MG tablet, Take 1 tablet by mouth every 6 (six) hours as needed for moderate pain., Disp: , Rfl:    Multiple Vitamin (MULTIVITAMIN WITH MINERALS) TABS tablet, Take 1 tablet by mouth daily., Disp: 30 tablet, Rfl: 1   valACYclovir (VALTREX) 500 MG tablet, Take 500 mg by mouth as needed., Disp: , Rfl:    metoprolol succinate (TOPROL XL) 25 MG 24 hr tablet, Take 1 tablet (25 mg total) by mouth every morning. Hold if top blood pressure number less than 100 mmHg or heart rate less than 60 bpm., Disp: 90 tablet, Rfl: 0  Orders  Placed This Encounter  Procedures   ECHOCARDIOGRAM COMPLETE   --Continue cardiac medications as reconciled in final medication list. --Return in about 3 months (around 06/12/2021) for Follow up, heart failure management.. Or sooner if needed. --Continue follow-up with your primary care physician regarding the management of your other chronic comorbid conditions.  Patient's questions and concerns were addressed to her satisfaction. She voices understanding of the instructions provided during this encounter.   This note was created using a voice recognition software as a result there may be grammatical errors inadvertently enclosed that do not reflect the nature of this encounter. Every attempt is made to correct such errors.  Tessa Lerner, [place], [facility]  Pager: [phone] Office: [phone]

## 2021-03-21 ENCOUNTER — Ambulatory Visit: Payer: Medicare Other | Admitting: Cardiology

## 2021-03-29 DIAGNOSIS — I5020 Unspecified systolic (congestive) heart failure: Secondary | ICD-10-CM | POA: Diagnosis not present

## 2021-03-29 DIAGNOSIS — E785 Hyperlipidemia, unspecified: Secondary | ICD-10-CM | POA: Diagnosis not present

## 2021-03-29 DIAGNOSIS — G43009 Migraine without aura, not intractable, without status migrainosus: Secondary | ICD-10-CM | POA: Diagnosis not present

## 2021-04-23 DIAGNOSIS — I5022 Chronic systolic (congestive) heart failure: Secondary | ICD-10-CM | POA: Diagnosis not present

## 2021-04-24 ENCOUNTER — Ambulatory Visit (HOSPITAL_COMMUNITY)
Admission: RE | Admit: 2021-04-24 | Discharge: 2021-04-24 | Disposition: A | Discharge status: Home / Self Care | Payer: Medicare Other | Source: Ambulatory Visit | Attending: Cardiology | Admitting: Cardiology

## 2021-04-24 ENCOUNTER — Other Ambulatory Visit: Payer: Self-pay

## 2021-04-24 DIAGNOSIS — Z87891 Personal history of nicotine dependence: Secondary | ICD-10-CM | POA: Insufficient documentation

## 2021-04-24 DIAGNOSIS — I340 Nonrheumatic mitral (valve) insufficiency: Secondary | ICD-10-CM | POA: Diagnosis not present

## 2021-04-24 DIAGNOSIS — I5022 Chronic systolic (congestive) heart failure: Secondary | ICD-10-CM | POA: Insufficient documentation

## 2021-04-24 DIAGNOSIS — R4182 Altered mental status, unspecified: Secondary | ICD-10-CM | POA: Insufficient documentation

## 2021-04-24 DIAGNOSIS — E785 Hyperlipidemia, unspecified: Secondary | ICD-10-CM | POA: Insufficient documentation

## 2021-04-24 LAB — ECHOCARDIOGRAM COMPLETE
AR max vel: 1.44 cm2
AV Area VTI: 1.38 cm2
AV Area mean vel: 1.39 cm2
AV Mean grad: 6 mmHg
AV Peak grad: 9.5 mmHg
Ao pk vel: 1.54 m/s
Area-P 1/2: 3.36 cm2
S' Lateral: 3 cm
Single Plane A4C EF: 56.2 %

## 2021-04-24 NOTE — Progress Notes (Signed)
  Echocardiogram 2D Echocardiogram has been performed.  Anita Martinez 04/24/2021, 10:36 AM

## 2021-04-25 NOTE — Progress Notes (Signed)
Called pt to inform her to keep appt on 09/22. Pt understood

## 2021-04-30 DIAGNOSIS — G8929 Other chronic pain: Secondary | ICD-10-CM | POA: Diagnosis not present

## 2021-04-30 DIAGNOSIS — Z1389 Encounter for screening for other disorder: Secondary | ICD-10-CM | POA: Diagnosis not present

## 2021-04-30 DIAGNOSIS — M545 Low back pain, unspecified: Secondary | ICD-10-CM | POA: Diagnosis not present

## 2021-04-30 DIAGNOSIS — E46 Unspecified protein-calorie malnutrition: Secondary | ICD-10-CM | POA: Diagnosis not present

## 2021-04-30 DIAGNOSIS — Z0000 Encounter for general adult medical examination without abnormal findings: Secondary | ICD-10-CM | POA: Diagnosis not present

## 2021-05-16 ENCOUNTER — Emergency Department (HOSPITAL_COMMUNITY): Payer: Medicare Other

## 2021-05-16 ENCOUNTER — Inpatient Hospital Stay (HOSPITAL_COMMUNITY)
Admission: EM | Admit: 2021-05-16 | Discharge: 2021-05-19 | DRG: 871 | Disposition: A | Discharge status: Home / Self Care | Payer: Medicare Other | Attending: Internal Medicine | Admitting: Internal Medicine

## 2021-05-16 ENCOUNTER — Other Ambulatory Visit: Payer: Self-pay

## 2021-05-16 ENCOUNTER — Telehealth: Payer: Self-pay

## 2021-05-16 ENCOUNTER — Encounter (HOSPITAL_COMMUNITY): Payer: Self-pay | Admitting: Emergency Medicine

## 2021-05-16 DIAGNOSIS — I5032 Chronic diastolic (congestive) heart failure: Secondary | ICD-10-CM | POA: Diagnosis present

## 2021-05-16 DIAGNOSIS — E872 Acidosis: Secondary | ICD-10-CM | POA: Diagnosis not present

## 2021-05-16 DIAGNOSIS — M549 Dorsalgia, unspecified: Secondary | ICD-10-CM | POA: Diagnosis present

## 2021-05-16 DIAGNOSIS — I5022 Chronic systolic (congestive) heart failure: Secondary | ICD-10-CM | POA: Diagnosis present

## 2021-05-16 DIAGNOSIS — G894 Chronic pain syndrome: Secondary | ICD-10-CM | POA: Diagnosis present

## 2021-05-16 DIAGNOSIS — N179 Acute kidney failure, unspecified: Secondary | ICD-10-CM | POA: Diagnosis not present

## 2021-05-16 DIAGNOSIS — I429 Cardiomyopathy, unspecified: Secondary | ICD-10-CM | POA: Diagnosis not present

## 2021-05-16 DIAGNOSIS — R410 Disorientation, unspecified: Secondary | ICD-10-CM | POA: Diagnosis not present

## 2021-05-16 DIAGNOSIS — Z20822 Contact with and (suspected) exposure to covid-19: Secondary | ICD-10-CM | POA: Diagnosis not present

## 2021-05-16 DIAGNOSIS — G9340 Encephalopathy, unspecified: Secondary | ICD-10-CM | POA: Diagnosis present

## 2021-05-16 DIAGNOSIS — A419 Sepsis, unspecified organism: Secondary | ICD-10-CM | POA: Diagnosis present

## 2021-05-16 DIAGNOSIS — N182 Chronic kidney disease, stage 2 (mild): Secondary | ICD-10-CM | POA: Diagnosis not present

## 2021-05-16 DIAGNOSIS — E7849 Other hyperlipidemia: Secondary | ICD-10-CM | POA: Diagnosis not present

## 2021-05-16 DIAGNOSIS — Z79899 Other long term (current) drug therapy: Secondary | ICD-10-CM

## 2021-05-16 DIAGNOSIS — G9341 Metabolic encephalopathy: Secondary | ICD-10-CM | POA: Diagnosis present

## 2021-05-16 DIAGNOSIS — Z87891 Personal history of nicotine dependence: Secondary | ICD-10-CM

## 2021-05-16 DIAGNOSIS — A4151 Sepsis due to Escherichia coli [E. coli]: Secondary | ICD-10-CM | POA: Diagnosis not present

## 2021-05-16 DIAGNOSIS — R404 Transient alteration of awareness: Secondary | ICD-10-CM | POA: Diagnosis not present

## 2021-05-16 DIAGNOSIS — I4891 Unspecified atrial fibrillation: Secondary | ICD-10-CM | POA: Diagnosis not present

## 2021-05-16 DIAGNOSIS — N390 Urinary tract infection, site not specified: Secondary | ICD-10-CM | POA: Diagnosis present

## 2021-05-16 DIAGNOSIS — Z743 Need for continuous supervision: Secondary | ICD-10-CM | POA: Diagnosis not present

## 2021-05-16 DIAGNOSIS — Z0389 Encounter for observation for other suspected diseases and conditions ruled out: Secondary | ICD-10-CM | POA: Diagnosis not present

## 2021-05-16 DIAGNOSIS — R6520 Severe sepsis without septic shock: Secondary | ICD-10-CM | POA: Diagnosis not present

## 2021-05-16 DIAGNOSIS — R6889 Other general symptoms and signs: Secondary | ICD-10-CM | POA: Diagnosis not present

## 2021-05-16 DIAGNOSIS — Z66 Do not resuscitate: Secondary | ICD-10-CM | POA: Diagnosis present

## 2021-05-16 DIAGNOSIS — I700 Atherosclerosis of aorta: Secondary | ICD-10-CM | POA: Diagnosis not present

## 2021-05-16 DIAGNOSIS — R0902 Hypoxemia: Secondary | ICD-10-CM | POA: Diagnosis not present

## 2021-05-16 LAB — CBC WITH DIFFERENTIAL/PLATELET
Abs Immature Granulocytes: 0.53 10*3/uL — ABNORMAL HIGH (ref 0.00–0.07)
Basophils Absolute: 0 10*3/uL (ref 0.0–0.1)
Basophils Relative: 0 %
Eosinophils Absolute: 0 10*3/uL (ref 0.0–0.5)
Eosinophils Relative: 0 %
HCT: 29.3 % — ABNORMAL LOW (ref 36.0–46.0)
Hemoglobin: 9.5 g/dL — ABNORMAL LOW (ref 12.0–15.0)
Immature Granulocytes: 2 %
Lymphocytes Relative: 4 %
Lymphs Abs: 0.9 10*3/uL (ref 0.7–4.0)
MCH: 33.8 pg (ref 26.0–34.0)
MCHC: 32.4 g/dL (ref 30.0–36.0)
MCV: 104.3 fL — ABNORMAL HIGH (ref 80.0–100.0)
Monocytes Absolute: 1.5 10*3/uL — ABNORMAL HIGH (ref 0.1–1.0)
Monocytes Relative: 6 %
Neutro Abs: 22.7 10*3/uL — ABNORMAL HIGH (ref 1.7–7.7)
Neutrophils Relative %: 88 %
Platelets: 179 10*3/uL (ref 150–400)
RBC: 2.81 MIL/uL — ABNORMAL LOW (ref 3.87–5.11)
RDW: 12.6 % (ref 11.5–15.5)
WBC: 25.7 10*3/uL — ABNORMAL HIGH (ref 4.0–10.5)
nRBC: 0 % (ref 0.0–0.2)

## 2021-05-16 LAB — COMPREHENSIVE METABOLIC PANEL
ALT: 12 U/L (ref 0–44)
AST: 23 U/L (ref 15–41)
Albumin: 3.1 g/dL — ABNORMAL LOW (ref 3.5–5.0)
Alkaline Phosphatase: 57 U/L (ref 38–126)
Anion gap: 9 (ref 5–15)
BUN: 42 mg/dL — ABNORMAL HIGH (ref 8–23)
CO2: 23 mmol/L (ref 22–32)
Calcium: 8.6 mg/dL — ABNORMAL LOW (ref 8.9–10.3)
Chloride: 107 mmol/L (ref 98–111)
Creatinine, Ser: 1.6 mg/dL — ABNORMAL HIGH (ref 0.44–1.00)
GFR, Estimated: 31 mL/min — ABNORMAL LOW (ref >60)
Glucose, Bld: 162 mg/dL — ABNORMAL HIGH (ref 70–99)
Potassium: 3.6 mmol/L (ref 3.5–5.1)
Sodium: 139 mmol/L (ref 135–145)
Total Bilirubin: 0.7 mg/dL (ref 0.3–1.2)
Total Protein: 6.7 g/dL (ref 6.5–8.1)

## 2021-05-16 LAB — RESP PANEL BY RT-PCR (FLU A&B, COVID) ARPGX2
Influenza A by PCR: NEGATIVE
Influenza B by PCR: NEGATIVE
SARS Coronavirus 2 by RT PCR: NEGATIVE

## 2021-05-16 LAB — LACTIC ACID, PLASMA
Lactic Acid, Venous: 1.6 mmol/L (ref 0.5–1.9)
Lactic Acid, Venous: 2.7 mmol/L (ref 0.5–1.9)

## 2021-05-16 LAB — PROTIME-INR
INR: 1.2 (ref 0.8–1.2)
Prothrombin Time: 15.1 seconds (ref 11.4–15.2)

## 2021-05-16 LAB — MRSA NEXT GEN BY PCR, NASAL: MRSA by PCR Next Gen: NOT DETECTED

## 2021-05-16 LAB — APTT: aPTT: 43 seconds — ABNORMAL HIGH (ref 24–36)

## 2021-05-16 LAB — POC OCCULT BLOOD, ED: Fecal Occult Bld: NEGATIVE

## 2021-05-16 MED ORDER — VANCOMYCIN HCL IN DEXTROSE 1-5 GM/200ML-% IV SOLN
1000.0000 mg | Freq: Once | INTRAVENOUS | Status: AC
  Administered 2021-05-16: 1000 mg via INTRAVENOUS
  Filled 2021-05-16: qty 200

## 2021-05-16 MED ORDER — SODIUM CHLORIDE 0.9 % IV SOLN: INTRAVENOUS | Status: DC

## 2021-05-16 MED ORDER — SODIUM CHLORIDE 0.9 % IV SOLN
1.0000 g | INTRAVENOUS | Status: DC
  Filled 2021-05-16: qty 10

## 2021-05-16 MED ORDER — LACTATED RINGERS IV SOLN: INTRAVENOUS | Status: AC

## 2021-05-16 MED ORDER — METRONIDAZOLE 500 MG/100ML IV SOLN
500.0000 mg | Freq: Once | INTRAVENOUS | Status: AC
  Administered 2021-05-16: 500 mg via INTRAVENOUS
  Filled 2021-05-16: qty 100

## 2021-05-16 MED ORDER — ENSURE ENLIVE PO LIQD
237.0000 mL | Freq: Two times a day (BID) | ORAL | Status: DC
  Administered 2021-05-17 – 2021-05-19 (×5): 237 mL via ORAL

## 2021-05-16 MED ORDER — SODIUM CHLORIDE 0.9 % IV BOLUS
1000.0000 mL | Freq: Once | INTRAVENOUS | Status: AC
  Administered 2021-05-16: 1000 mL via INTRAVENOUS

## 2021-05-16 MED ORDER — CHLORHEXIDINE GLUCONATE CLOTH 2 % EX PADS
6.0000 | MEDICATED_PAD | Freq: Every day | CUTANEOUS | Status: DC
  Administered 2021-05-16 – 2021-05-18 (×3): 6 via TOPICAL

## 2021-05-16 MED ORDER — SODIUM CHLORIDE 0.9 % IV BOLUS (SEPSIS)
1000.0000 mL | Freq: Once | INTRAVENOUS | Status: AC
  Administered 2021-05-16: 1000 mL via INTRAVENOUS

## 2021-05-16 MED ORDER — SODIUM CHLORIDE 0.9 % IV SOLN
2.0000 g | Freq: Once | INTRAVENOUS | Status: AC
  Administered 2021-05-16: 2 g via INTRAVENOUS
  Filled 2021-05-16: qty 2

## 2021-05-16 MED ORDER — SODIUM CHLORIDE 0.9 % IV SOLN: 1.0000 g | INTRAVENOUS | Status: DC

## 2021-05-16 MED ORDER — ACETAMINOPHEN 325 MG PO TABS
650.0000 mg | ORAL_TABLET | Freq: Four times a day (QID) | ORAL | Status: DC | PRN
  Administered 2021-05-16: 650 mg via ORAL
  Filled 2021-05-16: qty 2

## 2021-05-16 MED ORDER — ALBUMIN HUMAN 5 % IV SOLN
12.5000 g | Freq: Once | INTRAVENOUS | Status: AC
  Administered 2021-05-16: 12.5 g via INTRAVENOUS
  Filled 2021-05-16: qty 250

## 2021-05-16 NOTE — ED Provider Notes (Addendum)
Was checked [facility]-EMERGENCY DEPT Provider Note   CSN: [id number] Arrival date & time: 05/16/21  1149     History Chief Complaint  Patient presents with   Fever    Anita Martinez is a 87 y.o. female.  HPI  87 year old female past medical history of cardiomyopathy, CHF, AKI presents the emergency department concern for fever, hypotension and altered mental status.  Patient lives at home alone, she is normally very independent.  Today she was found to be weak, unable to walk or care for herself.  She was also noticed to be febrile at home.  Patient is altered and minimally contributing to history.  No report of vomiting, productive cough or diarrhea  Past Medical History:  Diagnosis Date   Cardiomyopathy (HCC)    CHF (congestive heart failure) (HCC)    Chronic back pain    Colon polyps    Colonic diverticular abscess    MR (mitral regurgitation)    TR (tricuspid regurgitation)     Patient Active Problem List   Diagnosis Date Noted   Chronic HFrEF (heart failure with reduced ejection fraction) (HCC)    Abnormal LFTs    AKI (acute kidney injury) (HCC)    Pressure injury of skin 01/31/2020   Acute heart failure (HCC) 01/31/2020   Acute CHF (congestive heart failure) (HCC) 01/30/2020   HA (headache) 06/25/2013   Chronic back pain 06/25/2013    Past Surgical History:  Procedure Laterality Date   ABDOMINAL HYSTERECTOMY     BLADDER SUSPENSION       OB History   No obstetric history on file.     History reviewed. No pertinent family history.  Social History   Tobacco Use   Smoking status: Former    Packs/day: 1.00    Years: 14.00    Pack years: 14.00    Types: Cigarettes    Start date: 71    Quit date: 1970    Years since quitting: 52.6   Smokeless tobacco: Never  Vaping Use   Vaping Use: Never used  Substance Use Topics   Alcohol use: No   Drug use: No    Home Medications Prior to Admission medications   Medication Sig  Start Date End Date Taking? Authorizing Provider  acetaminophen (TYLENOL) 500 MG tablet as needed.    [provider]  atorvastatin (LIPITOR) 10 MG tablet Take 10 mg by mouth daily. 07/19/20   [provider]  ENTRESTO 49-51 MG TAKE 1 TABLET BY MOUTH TWICE DAILY 11/19/20   Tolia, Sunit, DO  feeding supplement, ENSURE ENLIVE, (ENSURE ENLIVE) LIQD Take 237 mLs by mouth 2 (two) times daily between meals. 02/08/20   Alessandra Bevels, MD  fish oil-omega-3 fatty acids 1000 MG capsule Take 1 g by mouth 2 (two) times daily.    [provider]  HYDROcodone-acetaminophen (NORCO/VICODIN) 5-325 MG tablet Take 1 tablet by mouth every 6 (six) hours as needed for moderate pain.    [provider]  metoprolol succinate (TOPROL XL) 25 MG 24 hr tablet Take 1 tablet (25 mg total) by mouth every morning. Hold if top blood pressure number less than 100 mmHg or heart rate less than 60 bpm. 03/12/21 06/10/21  Tolia, Sunit, DO  Multiple Vitamin (MULTIVITAMIN WITH MINERALS) TABS tablet Take 1 tablet by mouth daily. 02/09/20   Alessandra Bevels, MD  valACYclovir (VALTREX) 500 MG tablet Take 500 mg by mouth as needed.    [provider]    Allergies  Morphine and related  Review of Systems   Review of Systems  Unable to perform ROS: Mental status change   Physical Exam Updated Vital Signs BP (!) 91/43   Pulse 76   Temp 98.1 F (36.7 C) (Oral)   Resp 19   SpO2 98%   Physical Exam Vitals and nursing note reviewed.  Constitutional:      General: She is not in acute distress.    Appearance: Normal appearance.  HENT:     Head: Normocephalic.     Mouth/Throat:     Mouth: Mucous membranes are moist.  Cardiovascular:     Rate and Rhythm: Normal rate.     Heart sounds: Murmur heard.  Pulmonary:     Effort: Pulmonary effort is normal. No respiratory distress.  Abdominal:     General: There is no distension.     Palpations: Abdomen is soft.     Tenderness: There is  no abdominal tenderness. There is no guarding.  Genitourinary:    Comments: Brown stool Musculoskeletal:     Cervical back: No rigidity.  Skin:    General: Skin is warm.  Neurological:     Mental Status: She is alert.     Comments: Oriented to self, follows commands, otherwise altered and noncontributory to history  Psychiatric:        Mood and Affect: Mood normal.    ED Results / Procedures / Treatments   Labs (all labs ordered are listed, but only abnormal results are displayed) Labs Reviewed  COMPREHENSIVE METABOLIC PANEL - Abnormal; Notable for the following components:      Result Value   Glucose, Bld 162 (*)    BUN 42 (*)    Creatinine, Ser 1.60 (*)    Calcium 8.6 (*)    Albumin 3.1 (*)    GFR, Estimated 31 (*)    All other components within normal limits  CBC WITH DIFFERENTIAL/PLATELET - Abnormal; Notable for the following components:   WBC 25.7 (*)    RBC 2.81 (*)    Hemoglobin 9.5 (*)    HCT 29.3 (*)    MCV 104.3 (*)    All other components within normal limits  APTT - Abnormal; Notable for the following components:   aPTT 43 (*)    All other components within normal limits  RESP PANEL BY RT-PCR (FLU A&B, COVID) ARPGX2  CULTURE, BLOOD (SINGLE)  URINE CULTURE  LACTIC ACID, PLASMA  PROTIME-INR  LACTIC ACID, PLASMA  URINALYSIS, ROUTINE W REFLEX MICROSCOPIC  POC OCCULT BLOOD, ED    EKG EKG Interpretation  Date/Time:  Thursday May 16 2021 12:27:13 EDT Ventricular Rate:  82 PR Interval:    QRS Duration: 86 QT Interval:  355 QTC Calculation: 415 R Axis:   24 Text Interpretation: Atrial fibrillation Multiple ventricular premature complexes Anterior infarct, old NSR with sinus arrhythmia and PVC, similar to previous Confirmed by Coralee Pesa [phone]) on 05/16/2021 1:23:59 PM  Radiology DG Chest Port 1 View  Result Date: 05/16/2021 CLINICAL DATA:  Question sepsis EXAM: PORTABLE CHEST 1 VIEW COMPARISON:  01/31/2020 FINDINGS: The heart size and  mediastinal contours are within normal limits. Atherosclerotic aortic arch. Both lungs are clear. The visualized skeletal structures are unremarkable. IMPRESSION: No active disease. Electronically Signed   By: Marlan Palau M.D.   On: 05/16/2021 13:04    Procedures .Critical Care  Date/Time: 05/16/2021 2:43 PM Performed by: Rozelle Logan, DO Authorized by: Rozelle Logan, DO   Critical care provider statement:  Critical care time (minutes):  60   Critical care time was exclusive of:  Separately billable procedures and treating other patients   Critical care was necessary to treat or prevent imminent or life-threatening deterioration of the following conditions:  Sepsis and shock   Critical care was time spent personally by me on the following activities:  Discussions with consultants, evaluation of patient's response to treatment, examination of patient, ordering and performing treatments and interventions, ordering and review of laboratory studies, ordering and review of radiographic studies, pulse oximetry, re-evaluation of patient's condition, obtaining history from patient or surrogate and review of old charts   I assumed direction of critical care for this patient from another provider in my specialty: no     Care discussed with: admitting provider     Medications Ordered in ED Medications  lactated ringers infusion (has no administration in time range)  ceFEPIme (MAXIPIME) 2 g in sodium chloride 0.9 % 100 mL IVPB (has no administration in time range)  metroNIDAZOLE (FLAGYL) IVPB 500 mg (has no administration in time range)  vancomycin (VANCOCIN) IVPB 1000 mg/200 mL premix (has no administration in time range)  sodium chloride 0.9 % bolus 1,000 mL (0 mLs Intravenous Stopped 05/16/21 1255)    ED Course  I have reviewed the triage vital signs and the nursing notes.  Pertinent labs & imaging results that were available during my care of the patient were reviewed by me and  considered in my medical decision making (see chart for details).    MDM Rules/Calculators/A&P                           87 year old female presents emergency department concern for fever, altered mental status.  She is hypotensive on arrival, at times tachycardic.  Oriented to herself and follows commands but otherwise a poor historian.  Patient has a systolic murmur, clear lung sounds, soft abdomen that may be intermittently tender but difficult to get a consistent reaction for the patient.  Blood work shows a leukocytosis of 25, and anemia of 9.5.  Lactic acid is normal, mild AKI.  Lactic acid is normal.  Blood pressure has so far been fluid responsive, she is received the septic protocol of IV fluids.  Tachycardia has resolved.  Patient's baseline hemoglobin is around 13, bedside fecal occult shows brown stool, pending result.  She is not anticoagulated.  Patient will be treated with antibiotics per sepsis protocol.  Urinalysis is still pending.  Chest x-ray shows no focal finding, flu and COVID are negative.  Patient will require admission.  Last blood pressure is 91/36, baseline seems around 100 systolic.  Heart rate has improved to 76.  Respiratory status stable, appears appropriate for floor admission.  Patients evaluation and results requires admission for further treatment and care. Patient agrees with admission plan, offers no new complaints and is stable/unchanged at time of admit.  Final Clinical Impression(s) / ED Diagnoses Final diagnoses:  None    Rx / DC Orders ED Discharge Orders     None        Rozelle Logan, DO 05/16/21 1445    Vuk Skillern, Clabe Seal, DO 05/16/21 1556

## 2021-05-16 NOTE — H&P (Signed)
History and Physical    Anita Martinez QPY:[id number] DOB: 1933-01-21 DOA: 05/16/2021  PCP: [name] Patient coming from: Home  I have personally briefly reviewed patient's old medical records in [facility]  Chief Complaint: Confusion  HPI: Anita Martinez is a 87 y.o. female with medical history significant of cardiomyopathy, heart failure presents with low blood pressure and confusion.  Patient baseline alert and oriented x3 and today has been alert and oriented x1-2 per daughter at bedside.  Patient had decreased p.o. intake and darker foul-smelling urine over the last day per day her daughter at bedside.  Patient usually walks with a walker at home but now is unable to walk.  No nausea vomiting or diarrhea but decreased p.o. intake.  Patient is arousable to tactile stimuli alert oriented x2 but quickly drifts back off to sleep.  Patient states no to all questions.  Blood pressures 80s and 90s systolic.  Patient baseline CKD 3 today's creatinine 1.6 from 1.1 GFR 31 from prior 50s.  Patient's BUN 42 from prior 19.  White count 25 H&H 9.5 and 29 platelets 179.  COVID and flu are negative.  Guaiac negative.  Chest x-ray showed no active disease.  EKG A. fib with PVCs.  Patient received a liter of fluid, LR at 150 an hour getting cefepime Flagyl and Vanco in the ED.  ED Course: As above  Review of Systems: As per HPI otherwise all other systems reviewed and are negative.  Limited due to altered mental status, history provided by chart review and daughter at bedside and ED staff.  Past Medical History:  Diagnosis Date   Cardiomyopathy (HCC)    CHF (congestive heart failure) (HCC)    Chronic back pain    Colon polyps    Colonic diverticular abscess    MR (mitral regurgitation)    TR (tricuspid regurgitation)     Past Surgical History:  Procedure Laterality Date   ABDOMINAL HYSTERECTOMY     BLADDER SUSPENSION      Social History  reports that she quit smoking  about 52 years ago. Her smoking use included cigarettes. She started smoking about 66 years ago. She has a 14.00 pack-year smoking history. She has never used smokeless tobacco. She reports that she does not drink alcohol and does not use drugs.  Allergies  Allergen Reactions   Morphine And Related     Makes hyper and unable to sleep    History reviewed. No pertinent family history.   Prior to Admission medications   Medication Sig Start Date End Date Taking? Authorizing Provider  acetaminophen (TYLENOL) 500 MG tablet as needed.    [provider]  atorvastatin (LIPITOR) 10 MG tablet Take 10 mg by mouth daily. 07/19/20   [provider]  ENTRESTO 49-51 MG TAKE 1 TABLET BY MOUTH TWICE DAILY 11/19/20   Tolia, Sunit, DO  feeding supplement, ENSURE ENLIVE, (ENSURE ENLIVE) LIQD Take 237 mLs by mouth 2 (two) times daily between meals. 02/08/20   Alessandra Bevels, MD  fish oil-omega-3 fatty acids 1000 MG capsule Take 1 g by mouth 2 (two) times daily.    [provider]  HYDROcodone-acetaminophen (NORCO/VICODIN) 5-325 MG tablet Take 1 tablet by mouth every 6 (six) hours as needed for moderate pain.    [provider]  metoprolol succinate (TOPROL XL) 25 MG 24 hr tablet Take 1 tablet (25 mg total) by mouth every morning. Hold if top blood pressure number less than 100 mmHg or heart  rate less than 60 bpm. 03/12/21 06/10/21  Tolia, Sunit, DO  Multiple Vitamin (MULTIVITAMIN WITH MINERALS) TABS tablet Take 1 tablet by mouth daily. 02/09/20   Alessandra Bevels, MD  valACYclovir (VALTREX) 500 MG tablet Take 500 mg by mouth as needed.    [provider]    Physical Exam: Vitals:   05/16/21 1430 05/16/21 1440 05/16/21 1500 05/16/21 1600  BP: (!) 83/42 (!) 91/36 (!) 74/44 (!) 80/37  Pulse: 71 76 (!) 115 68  Resp: 17 18 (!) 21 19  Temp:      TempSrc:      SpO2: 96% 97% 95% 96%    Constitutional: NAD, calm, comfortable Vitals:   05/16/21 1430 05/16/21 1440  05/16/21 1500 05/16/21 1600  BP: (!) 83/42 (!) 91/36 (!) 74/44 (!) 80/37  Pulse: 71 76 (!) 115 68  Resp: 17 18 (!) 21 19  Temp:      TempSrc:      SpO2: 96% 97% 95% 96%   Eyes: PERRL, lids and conjunctivae normal ENMT: Dry membranes are moist. Posterior pharynx clear of any exudate or lesions.Normal dentition.  Neck: normal, supple, no masses, no thyromegaly Respiratory: clear to auscultation bilaterally, no wheezing, no crackles. Normal respiratory effort. No accessory muscle use.  Cardiovascular: Regular rate and rhythm, no murmurs / rubs / gallops. No extremity edema. 2+ pedal pulses. No carotid bruits.  Abdomen: Hypoactive bowel sounds, abdominal hernia noted, minimal suprapubic tenderness Musculoskeletal: no clubbing / cyanosis.  No edema moves all 4 extremity spontaneously, 3 out of 5 bilateral upper and lower extremities Skin: no rashes, lesions, ulcers. No induration Neurologic: Alert and oriented x2, lethargic, arouses to tactile stimuli Psychiatric: Alert and orient x2, lethargic, arouses to tactile stimuli   Labs on Admission: I have personally reviewed following labs and imaging studies  CBC: Recent Labs  Lab 05/16/21 1218  WBC 25.7*  NEUTROABS PENDING  HGB 9.5*  HCT 29.3*  MCV 104.3*  PLT 179    Basic Metabolic Panel: Recent Labs  Lab 05/16/21 1218  NA 139  K 3.6  CL 107  CO2 23  GLUCOSE 162*  BUN 42*  CREATININE 1.60*  CALCIUM 8.6*    GFR: CrCl cannot be calculated (Unknown ideal weight.).  Liver Function Tests: Recent Labs  Lab 05/16/21 1218  AST 23  ALT 12  ALKPHOS 57  BILITOT 0.7  PROT 6.7  ALBUMIN 3.1*    Urine analysis:    Component Value Date/Time   COLORURINE YELLOW 01/31/2020 0637   APPEARANCEUR CLEAR 01/31/2020 0637   LABSPEC 1.013 01/31/2020 0637   PHURINE 5.0 01/31/2020 0637   GLUCOSEU NEGATIVE 01/31/2020 0637   HGBUR NEGATIVE 01/31/2020 0637   BILIRUBINUR NEGATIVE 01/31/2020 0637   KETONESUR NEGATIVE 01/31/2020 0637    PROTEINUR NEGATIVE 01/31/2020 0637   NITRITE NEGATIVE 01/31/2020 0637   LEUKOCYTESUR NEGATIVE 01/31/2020 6270    Radiological Exams on Admission: DG Chest Port 1 View  Result Date: 05/16/2021 CLINICAL DATA:  Question sepsis EXAM: PORTABLE CHEST 1 VIEW COMPARISON:  01/31/2020 FINDINGS: The heart size and mediastinal contours are within normal limits. Atherosclerotic aortic arch. Both lungs are clear. The visualized skeletal structures are unremarkable. IMPRESSION: No active disease. Electronically Signed   By: Marlan Palau M.D.   On: 05/16/2021 13:04    EKG: Independently reviewed.   Assessment/Plan Principal Problem:   Acute encephalopathy Active Problems:   AKI (acute kidney injury) (HCC)   Chronic HFrEF (heart failure with reduced ejection fraction) (HCC)   Sepsis secondary to  UTI [facility])   Other hyperlipidemia 87 year old female presents with acute encephalopathy secondary to UTI and acute kidney injury on CKD 3. Acute kidney injury on CKD 3-likely secondary to home Entresto and decreased p.o. intake Giving another bolus and albumin, follow-up ins and outs Follow-up morning labs  Acute encephalopathy-secondary to above UTI-neurochecks Fall precautions Bedrest  Sepsis due to presumed UTI-we will continue Rocephin Follow-up urine culture  Hypotension-holding home blood pressure meds, secondary to above  Hyperlipidemia-holding statin  Heart failure holding meds  Disposition-admit patient, telemetry, continue monitor N.p.o. until passes bedside swallow Awaiting UA Head CT negative  DVT prophylaxis: SCDs Code Status:   DNR per daughter at bedside Anita Martinez Family Communication:  As above Disposition Plan:   Patient is from:  Home  Anticipated DC to:  Home  Anticipated DC date:  To be determined  Anticipated DC barriers: Mentation improved  Consults called:   Admission status:  Inpatient telemetry stepdown  Severity of Illness: The appropriate patient status  for this patient is INPATIENT. Inpatient status is judged to be reasonable and necessary in order to provide the required intensity of service to ensure the patient's safety. The patient's presenting symptoms, physical exam findings, and initial radiographic and laboratory data in the context of their chronic comorbidities is felt to place them at high risk for further clinical deterioration. Furthermore, it is not anticipated that the patient will be medically stable for discharge from the hospital within 2 midnights of admission. The following factors support the patient status of inpatient.   " The patient's presenting symptoms include as above. " The worrisome physical exam findings include as above. " The initial radiographic and laboratory data are worrisome because of as above. " The chronic co-morbidities include as above.   * I certify that at the point of admission it is my clinical judgment that the patient will require inpatient hospital care spanning beyond 2 midnights from the point of admission due to high intensity of service, high risk for further deterioration and high frequency of surveillance required.Darnelle Catalan MD [facility] [phone] How to contact the [facility] Attending or Consulting provider 7A - 7P or covering provider during after hours 7P -7A, for this patient?   Check the care team in [facility] and look for a) attending/consulting [facility] provider listed and b) the [facility] team listed Log into [email or web address] and use [facility]'s universal password to access. If you do not have the password, please contact the hospital operator. Locate the [facility] provider you are looking for under [facility] and page to a number that you can be directly reached. If you still have difficulty reaching the provider, please page the [facility] (Director on Call) for the Hospitalists listed on amion for assistance.  05/16/2021, 4:37 PM

## 2021-05-16 NOTE — Progress Notes (Signed)
A consult was received from an ED physician for vancomycin and cefepime per pharmacy dosing (for an indication other than meningitis). The patient's profile has been reviewed for ht/wt/allergies/indication/available labs. A one time order has been placed for the above antibiotics.  Further antibiotics/pharmacy consults should be ordered by admitting physician if indicated.                       Bernadene Person, PharmD, BCPS [phone] 05/16/2021, 2:28 PM

## 2021-05-16 NOTE — ED Triage Notes (Signed)
Patient BIBA from home c/o new onset AMS, fever of 102 and urinary symptoms.   BP - 80/60, HR 100  20 g LH   350 cc NS  1000 mg tylenol  4 mg zofran

## 2021-05-17 DIAGNOSIS — G9340 Encephalopathy, unspecified: Secondary | ICD-10-CM

## 2021-05-17 DIAGNOSIS — I5022 Chronic systolic (congestive) heart failure: Secondary | ICD-10-CM

## 2021-05-17 DIAGNOSIS — E7849 Other hyperlipidemia: Secondary | ICD-10-CM

## 2021-05-17 DIAGNOSIS — N179 Acute kidney failure, unspecified: Secondary | ICD-10-CM

## 2021-05-17 LAB — BLOOD CULTURE ID PANEL (REFLEXED) - BCID2

## 2021-05-17 LAB — CBC WITH DIFFERENTIAL/PLATELET
Abs Immature Granulocytes: 0.4 10*3/uL — ABNORMAL HIGH (ref 0.00–0.07)
Basophils Absolute: 0 10*3/uL (ref 0.0–0.1)
Basophils Relative: 0 %
Eosinophils Absolute: 0 10*3/uL (ref 0.0–0.5)
Eosinophils Relative: 0 %
HCT: 25.6 % — ABNORMAL LOW (ref 36.0–46.0)
Hemoglobin: 8.1 g/dL — ABNORMAL LOW (ref 12.0–15.0)
Immature Granulocytes: 2 %
Lymphocytes Relative: 6 %
Lymphs Abs: 1.4 10*3/uL (ref 0.7–4.0)
MCH: 33.3 pg (ref 26.0–34.0)
MCHC: 31.6 g/dL (ref 30.0–36.0)
MCV: 105.3 fL — ABNORMAL HIGH (ref 80.0–100.0)
Monocytes Absolute: 1.5 10*3/uL — ABNORMAL HIGH (ref 0.1–1.0)
Monocytes Relative: 7 %
Neutro Abs: 18.4 10*3/uL — ABNORMAL HIGH (ref 1.7–7.7)
Neutrophils Relative %: 85 %
Platelets: 166 10*3/uL (ref 150–400)
RBC: 2.43 MIL/uL — ABNORMAL LOW (ref 3.87–5.11)
RDW: 13 % (ref 11.5–15.5)
WBC: 21.7 10*3/uL — ABNORMAL HIGH (ref 4.0–10.5)
nRBC: 0 % (ref 0.0–0.2)

## 2021-05-17 LAB — COMPREHENSIVE METABOLIC PANEL
ALT: 15 U/L (ref 0–44)
AST: 30 U/L (ref 15–41)
Albumin: 2.8 g/dL — ABNORMAL LOW (ref 3.5–5.0)
Alkaline Phosphatase: 64 U/L (ref 38–126)
Anion gap: 5 (ref 5–15)
BUN: 36 mg/dL — ABNORMAL HIGH (ref 8–23)
CO2: 21 mmol/L — ABNORMAL LOW (ref 22–32)
Calcium: 8.1 mg/dL — ABNORMAL LOW (ref 8.9–10.3)
Chloride: 110 mmol/L (ref 98–111)
Creatinine, Ser: 1.26 mg/dL — ABNORMAL HIGH (ref 0.44–1.00)
GFR, Estimated: 42 mL/min — ABNORMAL LOW (ref >60)
Glucose, Bld: 123 mg/dL — ABNORMAL HIGH (ref 70–99)
Potassium: 3.6 mmol/L (ref 3.5–5.1)
Sodium: 136 mmol/L (ref 135–145)
Total Bilirubin: 0.7 mg/dL (ref 0.3–1.2)
Total Protein: 6 g/dL — ABNORMAL LOW (ref 6.5–8.1)

## 2021-05-17 LAB — PROCALCITONIN: Procalcitonin: 38.51 ng/mL

## 2021-05-17 MED ORDER — SODIUM CHLORIDE 0.9 % IV SOLN: INTRAVENOUS | Status: DC

## 2021-05-17 MED ORDER — HYDROCODONE-ACETAMINOPHEN 5-325 MG PO TABS
1.0000 | ORAL_TABLET | ORAL | Status: DC
  Administered 2021-05-17 – 2021-05-18 (×2): 1 via ORAL
  Filled 2021-05-17 (×2): qty 1

## 2021-05-17 MED ORDER — ACETAMINOPHEN 325 MG PO TABS
650.0000 mg | ORAL_TABLET | Freq: Four times a day (QID) | ORAL | Status: DC | PRN
  Administered 2021-05-17 – 2021-05-19 (×5): 650 mg via ORAL
  Filled 2021-05-17 (×5): qty 2

## 2021-05-17 MED ORDER — SODIUM CHLORIDE 0.9 % IV SOLN
2.0000 g | INTRAVENOUS | Status: DC
  Administered 2021-05-17 – 2021-05-19 (×3): 2 g via INTRAVENOUS
  Filled 2021-05-17: qty 2
  Filled 2021-05-17 (×2): qty 20

## 2021-05-17 MED ORDER — GUAIFENESIN ER 600 MG PO TB12
600.0000 mg | ORAL_TABLET | Freq: Two times a day (BID) | ORAL | Status: DC
  Administered 2021-05-17 (×2): 600 mg via ORAL
  Filled 2021-05-17 (×2): qty 1

## 2021-05-17 NOTE — Telephone Encounter (Signed)
We spoke about this yesterday.  Given her fevers I suspect she is have an infection, source unknown at this time.  Low BP and elevated HR may be compensatory.  Please have her follow-up with either PCP or go to the ER for further evaluation. With regards to blood pressure management if the systolic blood pressure is less than 100 mmHg please hold her heart failure medications.

## 2021-05-17 NOTE — Progress Notes (Signed)
[facility]                                                                              Patient Demographics  Anita Martinez, is a 87 y.o. female, DOB - 1933/01/07, LTJ:[id number]  Admit date - 05/16/2021   Admitting Physician Jacqlyn Krauss, MD  Outpatient Primary MD for the patient is Leeroy Cha  Outpatient specialists:   LOS - 1  days   Medical records reviewed and are as summarized below:    Chief Complaint  Patient presents with   Fever       Brief summary   Patient is a 87 year old female with cardiomyopathy, CHF presented to ED with hypotension and confusion.  Patient had decreased p.o. intake and darker foul-smelling urine over the last 1 to 2 days before admission.  Per daughter, she usually walks with a walker at home drives, and functional with her ADLs.  No nausea vomiting or diarrhea but decreased p.o. intake. In ED, BP was in the 07M and 22Q systolic.  Creatinine 1.6, baseline 1.1.  WBC is 25.  Chest x-ray showed no active disease. Patient received IV fluid hydration, was started on IV vancomycin, cefepime and Flagyl in ED.   Assessment & Plan    Principal Problem: Sepsis secondary to UTI, E. coli bacteremia  -Patient met sepsis criteria at the time of admission with hypotension, SBP in 80s, temp 102 F, tachycardia, leukocytosis 25.7 with left shift, lactic acidosis, elevated procalcitonin 38.5 -no urine culture collected and sent from ED, blood culture positive for E. Coli -Received broad-spectrum antibiotics in ED, narrowed to ceftriaxone, increase to 2 g IV daily -Renal function improving.  Active Problems: Acute metabolic encephalopathy -Likely due to sepsis, UTI, E. coli bacteremia, - improving from the time of admission, confirmed by the daughter at the bedside but still not at baseline -No focal neurological deficits, CT head showed no acute intracranial abnormality    AKI (acute kidney injury) ([place]) on CKD stage  II, lactic acidosis -Baseline creatinine 1.1, presented with creatinine of 1.6 secondary to sepsis, UTI -Placed on aggressive IV fluid hydration, hold Entresto and antihypertensives -Creatinine improving to 1.2    Chronic diastolic CHF -2D echo 11/3352, showed EF of 50 to 56%, grade 1 diastolic dysfunction, low normal right ventricular function, mild MR -Hold Toprol-XL and Entresto -Euvolemic, monitor volume status  Chronic pain syndrome -Discussed with daughter at the bedside at length.  Patient used to be on opioids in the past for 40 years, has been successfully weaned to [place], 1 tab at bedtime only.  Per daughter, patient has dependency and will continue to ask for pain medication and requested not to put her on any extra narcotics except 1 tablet at the time.    Code Status: DNR DVT Prophylaxis:  SCDs Start: 05/16/21 1634   Level of Care: Level of care: Stepdown Family Communication: Discussed all imaging results, lab results, explained to the patient and daughter at the bedside   Disposition Plan:     Status is: Inpatient  Remains inpatient appropriate because:Inpatient level of care appropriate due to severity of illness  Dispo: The patient  is from: Home              Anticipated d/c is to: Home              Patient currently is not medically stable to d/c.   Difficult to place patient No      Time Spent in minutes   35 minutes  Procedures:  None  Consultants:   None  Antimicrobials:   Anti-infectives (From admission, onward)    Start     Dose/Rate Route Frequency Ordered Stop   05/17/21 1022  cefTRIAXone (ROCEPHIN) 2 g in sodium chloride 0.9 % 100 mL IVPB        2 g 200 mL/hr over 30 Minutes Intravenous Every 24 hours 05/17/21 1022     05/17/21 1000  cefTRIAXone (ROCEPHIN) 1 g in sodium chloride 0.9 % 100 mL IVPB  Status:  Discontinued        1 g 200 mL/hr over 30 Minutes Intravenous Every 24 hours 05/16/21 1635 05/17/21 1022   05/16/21 1645   cefTRIAXone (ROCEPHIN) 1 g in sodium chloride 0.9 % 100 mL IVPB  Status:  Discontinued        1 g 200 mL/hr over 30 Minutes Intravenous Every 24 hours 05/16/21 1631 05/16/21 1635   05/16/21 1430  ceFEPIme (MAXIPIME) 2 g in sodium chloride 0.9 % 100 mL IVPB        2 g 200 mL/hr over 30 Minutes Intravenous  Once 05/16/21 1419 05/16/21 1729   05/16/21 1430  metroNIDAZOLE (FLAGYL) IVPB 500 mg        500 mg 100 mL/hr over 60 Minutes Intravenous  Once 05/16/21 1419 05/16/21 1758   05/16/21 1430  vancomycin (VANCOCIN) IVPB 1000 mg/200 mL premix        1,000 mg 200 mL/hr over 60 Minutes Intravenous  Once 05/16/21 1419 05/16/21 1901          Medications  Scheduled Meds:  Chlorhexidine Gluconate Cloth  6 each Topical Daily   feeding supplement  237 mL Oral BID BM   guaiFENesin  600 mg Oral BID   HYDROcodone-acetaminophen  1 tablet Oral Q24H   Continuous Infusions:  sodium chloride 75 mL/hr at 05/17/21 0914   cefTRIAXone (ROCEPHIN)  IV Stopped (05/17/21 1105)   PRN Meds:.acetaminophen      Subjective:   [name] was seen and examined today.  Has hearing deficit, much more alert and oriented to self and place.  Per daughter at the bedside, currently not at baseline.  During encounter, patient continued to complain about pain in the head, shoulder, neck, everywhere asking for something for pain.   Patient denies dizziness, chest pain, shortness of breath,.  No nausea vomiting or diarrhea.   No acute events overnight.    Objective:   Vitals:   05/17/21 1000 05/17/21 1100 05/17/21 1200 05/17/21 1216  BP: (!) 124/43 (!) 92/30 (!) 101/39   Pulse: 80 78 77   Resp: '10 19 18   ' Temp:    97.9 F (36.6 C)  TempSrc:    Axillary  SpO2: 95% 95% 98%   Weight:      Height:        Intake/Output Summary (Last 24 hours) at 05/17/2021 1324 Last data filed at 05/17/2021 0843 Gross per 24 hour  Intake 2371.58 ml  Output 925 ml  Net 1446.58 ml     Wt Readings from Last 3 Encounters:   05/16/21 56.2 kg  03/12/21 51 kg  12/04/20 51.3 kg  Exam General: Alert and oriented x self and place, NAD, coughing Cardiovascular: S1 S2 auscultated, no murmurs, RRR Respiratory: Clear to auscultation bilaterally Gastrointestinal: Soft, nontender, nondistended, + bowel sounds Ext: no pedal edema bilaterally Neuro: no new FND's Skin: No rashes Psych: still some confusion, oriented to self and place   Data Reviewed:  I have personally reviewed following labs and imaging studies  Micro Results Recent Results (from the past 240 hour(s))  Blood culture (routine single)     Status: None (Preliminary result)   Collection Time: 05/16/21 12:18 PM   Specimen: BLOOD LEFT FOREARM  Result Value Ref Range Status   Specimen Description   Final    BLOOD LEFT FOREARM Performed at [facility], [address] [place], [place] [address]    Special Requests   Final    BOTTLES DRAWN AEROBIC AND ANAEROBIC Blood Culture results may not be optimal due to an excessive volume of blood received in culture bottles Performed at [facility] [address], [place], [place] [address]    Culture  Setup Time   Final    GRAM NEGATIVE RODS IN BOTH AEROBIC AND ANAEROBIC BOTTLES CRITICAL RESULT CALLED TO, READ BACK BY AND VERIFIED WITH: PHARMD JUSTIN LEGGE 05/17/21 '@0951'  BY JW Performed at [facility], [place] [address], [place], [place] [address]    Culture GRAM NEGATIVE RODS  Final   Report Status PENDING  Incomplete  Blood Culture ID Panel (Reflexed)     Status: Abnormal   Collection Time: 05/16/21 12:18 PM  Result Value Ref Range Status   Enterococcus faecalis NOT DETECTED NOT DETECTED Final   Enterococcus Faecium NOT DETECTED NOT DETECTED Final   Listeria monocytogenes NOT DETECTED NOT DETECTED Final   Staphylococcus species NOT DETECTED NOT DETECTED Final   Staphylococcus aureus (BCID) NOT DETECTED NOT DETECTED Final   Staphylococcus epidermidis NOT DETECTED  NOT DETECTED Final   Staphylococcus lugdunensis NOT DETECTED NOT DETECTED Final   Streptococcus species NOT DETECTED NOT DETECTED Final   Streptococcus agalactiae NOT DETECTED NOT DETECTED Final   Streptococcus pneumoniae NOT DETECTED NOT DETECTED Final   Streptococcus pyogenes NOT DETECTED NOT DETECTED Final   A.calcoaceticus-baumannii NOT DETECTED NOT DETECTED Final   Bacteroides fragilis NOT DETECTED NOT DETECTED Final   Enterobacterales DETECTED (A) NOT DETECTED Final    Comment: Enterobacterales represent a large order of gram negative bacteria, not a single organism. CRITICAL RESULT CALLED TO, READ BACK BY AND VERIFIED WITH: PHARMD JUSTIN LEGG 05/17/21 '@0951'  BY JW    Enterobacter cloacae complex NOT DETECTED NOT DETECTED Final   Escherichia coli DETECTED (A) NOT DETECTED Final    Comment: CRITICAL RESULT CALLED TO, READ BACK BY AND VERIFIED WITH: PHARMD JUSTIN LEGG 05/17/21 '@0951'  BY JW    Klebsiella aerogenes NOT DETECTED NOT DETECTED Final   Klebsiella oxytoca NOT DETECTED NOT DETECTED Final   Klebsiella pneumoniae NOT DETECTED NOT DETECTED Final   Proteus species NOT DETECTED NOT DETECTED Final   Salmonella species NOT DETECTED NOT DETECTED Final   Serratia marcescens NOT DETECTED NOT DETECTED Final   Haemophilus influenzae NOT DETECTED NOT DETECTED Final   Neisseria meningitidis NOT DETECTED NOT DETECTED Final   Pseudomonas aeruginosa NOT DETECTED NOT DETECTED Final   Stenotrophomonas maltophilia NOT DETECTED NOT DETECTED Final   Candida albicans NOT DETECTED NOT DETECTED Final   Candida auris NOT DETECTED NOT DETECTED Final   Candida glabrata NOT DETECTED NOT DETECTED Final   Candida krusei NOT DETECTED NOT DETECTED  Final   Candida parapsilosis NOT DETECTED NOT DETECTED Final   Candida tropicalis NOT DETECTED NOT DETECTED Final   Cryptococcus neoformans/gattii NOT DETECTED NOT DETECTED Final   CTX-M ESBL NOT DETECTED NOT DETECTED Final   Carbapenem resistance IMP NOT  DETECTED NOT DETECTED Final   Carbapenem resistance KPC NOT DETECTED NOT DETECTED Final   Carbapenem resistance NDM NOT DETECTED NOT DETECTED Final   Carbapenem resist OXA 48 LIKE NOT DETECTED NOT DETECTED Final   Carbapenem resistance VIM NOT DETECTED NOT DETECTED Final    Comment: Performed at [facility], [place] [address], [place], [place] [address]  Resp Panel by RT-PCR (Flu A&B, Covid) Nasopharyngeal Swab     Status: None   Collection Time: 05/16/21  1:32 PM   Specimen: Nasopharyngeal Swab; Nasopharyngeal(NP) swabs in vial transport medium  Result Value Ref Range Status   SARS Coronavirus 2 by RT PCR NEGATIVE NEGATIVE Final    Comment: (NOTE) SARS-CoV-2 target nucleic acids are NOT DETECTED.  The SARS-CoV-2 RNA is generally detectable in upper respiratory specimens during the acute phase of infection. The lowest concentration of SARS-CoV-2 viral copies this assay can detect is 138 copies/mL. A negative result does not preclude SARS-Cov-2 infection and should not be used as the sole basis for treatment or other patient management decisions. A negative result may occur with  improper specimen collection/handling, submission of specimen other than nasopharyngeal swab, presence of viral mutation(s) within the areas targeted by this assay, and inadequate number of viral copies(<138 copies/mL). A negative result must be combined with clinical observations, patient history, and epidemiological information. The expected result is Negative.  Fact Sheet for Patients:  EntrepreneurPulse.com.au  Fact Sheet for Healthcare Providers:  IncredibleEmployment.be  This test is no t yet approved or cleared by the [place] FDA and  has been authorized for detection and/or diagnosis of SARS-CoV-2 by FDA under an Emergency Use Authorization (EUA). This EUA will remain  in effect (meaning this test can be used) for the duration of the COVID-19  declaration under Section 564(b)(1) of the Act, 21 U.S.C.section 360bbb-3(b)(1), unless the authorization is terminated  or revoked sooner.       Influenza A by PCR NEGATIVE NEGATIVE Final   Influenza B by PCR NEGATIVE NEGATIVE Final    Comment: (NOTE) The Xpert Xpress SARS-CoV-2/FLU/RSV plus assay is intended as an aid in the diagnosis of influenza from Nasopharyngeal swab specimens and should not be used as a sole basis for treatment. Nasal washings and aspirates are unacceptable for Xpert Xpress SARS-CoV-2/FLU/RSV testing.  Fact Sheet for Patients: EntrepreneurPulse.com.au  Fact Sheet for Healthcare Providers: IncredibleEmployment.be  This test is not yet approved or cleared by the [place] FDA and has been authorized for detection and/or diagnosis of SARS-CoV-2 by FDA under an Emergency Use Authorization (EUA). This EUA will remain in effect (meaning this test can be used) for the duration of the COVID-19 declaration under Section 564(b)(1) of the Act, 21 U.S.C. section 360bbb-3(b)(1), unless the authorization is terminated or revoked.  Performed at [facility], [place] [address] [place], [place] [address]   MRSA Next Gen by PCR, Nasal     Status: None   Collection Time: 05/16/21  7:07 PM   Specimen: Nasal Mucosa; Nasal Swab  Result Value Ref Range Status   MRSA by PCR Next Gen NOT DETECTED NOT DETECTED Final    Comment: (NOTE) The GeneXpert MRSA Assay (FDA approved for NASAL specimens only), is one component of a comprehensive  MRSA colonization surveillance program. It is not intended to diagnose MRSA infection nor to guide or monitor treatment for MRSA infections. Test performance is not FDA approved in patients less than 25 years old. Performed at [facility], [place] [address] [place], [place] [address]     Radiology Reports CT Head Wo Contrast  Result Date: 05/16/2021 CLINICAL DATA:   Mental status change EXAM: CT HEAD WITHOUT CONTRAST TECHNIQUE: Contiguous axial images were obtained from the base of the skull through the vertex without intravenous contrast. COMPARISON:  03/14/2020 FINDINGS: Brain: No evidence of acute infarction, hemorrhage, hydrocephalus, extra-axial collection or mass lesion/mass effect. Diffuse cortical atrophy. 5 mm calcified lesion along the inner table of the right parietal bone likely a small meningioma. Is not significantly changed since prior exam. Vascular: No hyperdense vessel or unexpected calcification. Skull: Normal. Negative for fracture or focal lesion. Sinuses/Orbits: Partial opacification of left maxillary sinus. Other: None. IMPRESSION: No acute intracranial abnormality. Electronically Signed   By: Miachel Roux M.D.   On: 05/16/2021 16:37   DG Chest Port 1 View  Result Date: 05/16/2021 CLINICAL DATA:  Question sepsis EXAM: PORTABLE CHEST 1 VIEW COMPARISON:  01/31/2020 FINDINGS: The heart size and mediastinal contours are within normal limits. Atherosclerotic aortic arch. Both lungs are clear. The visualized skeletal structures are unremarkable. IMPRESSION: No active disease. Electronically Signed   By: Franchot Gallo M.D.   On: 05/16/2021 13:04   ECHOCARDIOGRAM COMPLETE  Result Date: 04/24/2021    ECHOCARDIOGRAM REPORT   Patient Name:   [facility] Date of Exam: 04/24/2021 Medical Rec #:  [id number]       Height:       65.0 in Accession #:    [id number]      Weight:       112.4 lb Date of Birth:  1932-10-14       BSA:          1.548 m Patient Age:    19 years        BP:           122/46 mmHg Patient Gender: F               HR:           54 bpm. Exam Location:  Outpatient Procedure: 2D Echo, Cardiac Doppler and Color Doppler Indications:     CHF  History:         Patient has prior history of Echocardiogram examinations, most                  recent 05/07/2020. CHF, Aortic Valve Disease and MR, TR,                  Signs/Symptoms:Altered Mental Status;  Risk Factors:Former                  Smoker and Dyslipidemia.  Sonographer:     Dustin Flock RDCS Referring Phys:  [id number] SUNIT TOLIA Diagnosing Phys: Rex Kras DO IMPRESSIONS  1. Left ventricular ejection fraction, by estimation, is 50 to 55%. The left ventricle has low normal function. The left ventricle has no regional wall motion abnormalities. Left ventricular diastolic parameters are consistent with Grade I diastolic dysfunction (impaired relaxation).  2. Right ventricular systolic function is low normal. The right ventricular size is upper limit of normal. There is normal pulmonary artery systolic pressure.  3. Left atrial size was mildly dilated.  4. The mitral valve is degenerative. Mild mitral  valve regurgitation.  5. The aortic valve is tricuspid. Aortic valve regurgitation is not visualized. Mild aortic valve sclerosis is present, with no evidence of aortic valve stenosis.  6. The inferior vena cava is normal in size with greater than 50% respiratory variability, suggesting right atrial pressure of 3 mmHg. Comparison(s): Prior study 05/07/2020: LVEF 20-25%, severe global hypokinesis, Grade 3 DD, elevated LAP, LAE severely dilated, Moderate MR, RAP normal. FINDINGS  Left Ventricle: Left ventricular ejection fraction, by estimation, is 50 to 55%. The left ventricle has low normal function. The left ventricle has no regional wall motion abnormalities. The left ventricular internal cavity size was normal in size. There is no left ventricular hypertrophy. Left ventricular diastolic parameters are consistent with Grade I diastolic dysfunction (impaired relaxation). Right Ventricle: The right ventricular size is upper limit of normal. No increase in right ventricular wall thickness. Right ventricular systolic function is low normal. There is normal pulmonary artery systolic pressure. The tricuspid regurgitant velocity is 2.02 m/s, and with an assumed right atrial pressure of 3 mmHg, the estimated right  ventricular systolic pressure is 22.6 mmHg. Left Atrium: Left atrial size was mildly dilated. Right Atrium: Right atrial size was normal in size. Pericardium: There is no evidence of pericardial effusion. Mitral Valve: The mitral valve is degenerative in appearance. There is mild thickening of the mitral valve leaflet(s). There is mild calcification of the mitral valve leaflet(s). Normal mobility of the mitral valve leaflets. Mild mitral annular calcification. Mild mitral valve regurgitation. Tricuspid Valve: The tricuspid valve is grossly normal. Tricuspid valve regurgitation is trivial. No evidence of tricuspid stenosis. Aortic Valve: The aortic valve is tricuspid. Aortic valve regurgitation is not visualized. Mild aortic valve sclerosis is present, with no evidence of aortic valve stenosis. Aortic valve mean gradient measures 6.0 mmHg. Aortic valve peak gradient measures 9.5 mmHg. Aortic valve area, by VTI measures 1.38 cm. Pulmonic Valve: The pulmonic valve was grossly normal. Pulmonic valve regurgitation is not visualized. No evidence of pulmonic stenosis. Aorta: The aortic root and ascending aorta are structurally normal, with no evidence of dilitation. Venous: The inferior vena cava is normal in size with greater than 50% respiratory variability, suggesting right atrial pressure of 3 mmHg. IAS/Shunts: No atrial level shunt detected by color flow Doppler.  LEFT VENTRICLE PLAX 2D LVIDd:         4.10 cm     Diastology LVIDs:         3.00 cm     LV e' medial:    6.53 cm/s LV PW:         0.80 cm     LV E/e' medial:  11.0 LV IVS:        0.80 cm     LV e' lateral:   5.66 cm/s LVOT diam:     1.80 cm     LV E/e' lateral: 12.7 LV SV:         51 LV SV Index:   33 LVOT Area:     2.54 cm  LV Volumes (MOD) LV vol d, MOD A4C: 71.0 ml LV vol s, MOD A4C: 31.1 ml LV SV MOD A4C:     71.0 ml RIGHT VENTRICLE RV Basal diam:  3.50 cm RV S prime:     8.38 cm/s TAPSE (M-mode): 1.8 cm LEFT ATRIUM             Index       RIGHT ATRIUM  Index LA diam:        3.30 cm 2.13 cm/m  RA Area:     10.70 cm LA Vol (A2C):   64.3 ml 41.53 ml/m RA Volume:   25.10 ml  16.21 ml/m LA Vol (A4C):   38.7 ml 24.99 ml/m LA Biplane Vol: 54.9 ml 35.46 ml/m  AORTIC VALVE AV Area (Vmax):    1.44 cm AV Area (Vmean):   1.39 cm AV Area (VTI):     1.38 cm AV Vmax:           154.33 cm/s AV Vmean:          115.000 cm/s AV VTI:            0.373 m AV Peak Grad:      9.5 mmHg AV Mean Grad:      6.0 mmHg LVOT Vmax:         87.20 cm/s LVOT Vmean:        62.800 cm/s LVOT VTI:          0.202 m LVOT/AV VTI ratio: 0.54  AORTA Ao Root diam: 2.40 cm MITRAL VALVE               TRICUSPID VALVE MV Area (PHT): 3.36 cm    TR Peak grad:   16.3 mmHg MV Decel Time: 226 msec    TR Vmax:        202.00 cm/s MV E velocity: 71.60 cm/s MV A velocity: 75.80 cm/s  SHUNTS MV E/A ratio:  0.94        Systemic VTI:  0.20 m                            Systemic Diam: 1.80 cm Sunit Tolia DO Electronically signed by Rex Kras DO Signature Date/Time: 04/24/2021/2:09:13 PM    Final     Lab Data:  CBC: Recent Labs  Lab 05/16/21 1218 05/17/21 0301  WBC 25.7* 21.7*  NEUTROABS 22.7* 18.4*  HGB 9.5* 8.1*  HCT 29.3* 25.6*  MCV 104.3* 105.3*  PLT 179 937   Basic Metabolic Panel: Recent Labs  Lab 05/16/21 1218 05/17/21 0301  NA 139 136  K 3.6 3.6  CL 107 110  CO2 23 21*  GLUCOSE 162* 123*  BUN 42* 36*  CREATININE 1.60* 1.26*  CALCIUM 8.6* 8.1*   GFR: Estimated Creatinine Clearance: 28.4 mL/min (A) (by C-G formula based on SCr of 1.26 mg/dL (H)). Liver Function Tests: Recent Labs  Lab 05/16/21 1218 05/17/21 0301  AST 23 30  ALT 12 15  ALKPHOS 57 64  BILITOT 0.7 0.7  PROT 6.7 6.0*  ALBUMIN 3.1* 2.8*   No results for input(s): LIPASE, AMYLASE in the last 168 hours. No results for input(s): AMMONIA in the last 168 hours. Coagulation Profile: Recent Labs  Lab 05/16/21 1218  INR 1.2   Cardiac Enzymes: No results for input(s): CKTOTAL, CKMB, CKMBINDEX,  TROPONINI in the last 168 hours. BNP (last 3 results) Recent Labs    07/19/20 0935 11/29/20 0843 03/05/21 0933  PROBNP 442 211 145   HbA1C: No results for input(s): HGBA1C in the last 72 hours. CBG: No results for input(s): GLUCAP in the last 168 hours. Lipid Profile: No results for input(s): CHOL, HDL, LDLCALC, TRIG, CHOLHDL, LDLDIRECT in the last 72 hours. Thyroid Function Tests: No results for input(s): TSH, T4TOTAL, FREET4, T3FREE, THYROIDAB in the last 72 hours. Anemia Panel: No results for input(s): VITAMINB12, FOLATE, FERRITIN,  TIBC, IRON, RETICCTPCT in the last 72 hours. Urine analysis:    Component Value Date/Time   COLORURINE YELLOW 01/31/2020 [place] 01/31/2020 0637   LABSPEC 1.013 01/31/2020 [place] 5.0 01/31/2020 [place] 01/31/2020 [place] 01/31/2020 [place] 01/31/2020 [place] 01/31/2020 [place] NEGATIVE 01/31/2020 0637   NITRITE NEGATIVE 01/31/2020 0637   LEUKOCYTESUR NEGATIVE 01/31/2020 2395     Roshan Salamon M.D. [facility] 05/17/2021, 1:24 PM  Available via Epic secure chat 7am-7pm After 7 pm, please refer to night coverage provider listed on amion.

## 2021-05-17 NOTE — TOC Initial Note (Signed)
Transition of Care [facility]) - Initial/Assessment Note    Patient Details  Name: Anita Martinez MRN: [id number] Date of Birth: [birth year]  Transition of Care [facility]) CM/SW Contact:    Golda Acre, RN Phone Number: 05/17/2021, 8:38 AM  Clinical Narrative:                 87 y.o. female with medical history significant of cardiomyopathy, heart failure presents with low blood pressure and confusion.  Patient baseline alert and oriented x3 and today has been alert and oriented x1-2 per daughter at bedside.  Patient had decreased p.o. intake and darker foul-smelling urine over the last day per day her daughter at bedside.  Patient usually walks with a walker at home but now is unable to walk.  No nausea vomiting or diarrhea but decreased p.o. intake.  Patient is arousable to tactile stimuli alert oriented x2 but quickly drifts back off to sleep.  Patient states no to all questions.  Blood pressures 80s and 90s systolic.  Patient baseline CKD 3 today's creatinine 1.6 from 1.1 GFR 31 from prior 50s.  Patient's BUN 42 from prior 19.  White count 25 H&H 9.5 and 29 platelets 179.  COVID and flu are negative.  Guaiac negative.  Chest x-ray showed no active disease.  EKG A. fib with PVCs.  Patient received a liter of fluid, LR at 150 an hour getting cefepime Flagyl and Vanco in the ED. TOC PLAN OF CARE: from home plan is to return to home, Progression: Temp 102, room air, iv rocephin,iv flds, bld culture + for GNR, bun 36 creat 1.26, wbc 21.7. Expected Discharge Plan: Home/Self Care Barriers to Discharge: Continued Medical Work up   Patient Goals and CMS Choice        Expected Discharge Plan and Services Expected Discharge Plan: Home/Self Care   Discharge Planning Services: CM Consult   Living arrangements for the past 2 months: Single Family Home                                      Prior Living Arrangements/Services Living arrangements for the past 2 months: Single Family  Home Lives with:: Self                   Activities of Daily Living Home Assistive Devices/Equipment: Dentures (specify type), Eyeglasses, Shower chair without back, Cane (specify quad or straight), Hand-held shower hose, Other (Comment) (upper/lower dentures, tub/shower unit, single point cane) ADL Screening (condition at time of admission) Patient's cognitive ability adequate to safely complete daily activities?: No Is the patient deaf or have difficulty hearing?: Yes (HOH) Does the patient have difficulty seeing, even when wearing glasses/contacts?: No Does the patient have difficulty concentrating, remembering, or making decisions?: Yes Patient able to express need for assistance with ADLs?: Yes Does the patient have difficulty dressing or bathing?: No Independently performs ADLs?: Yes (appropriate for developmental age) Does the patient have difficulty walking or climbing stairs?: Yes (secondary to weakness) Weakness of Legs: Both Weakness of Arms/Hands: None  Permission Sought/Granted                  Emotional Assessment Appearance:: Appears stated age     Orientation: : Fluctuating Orientation (Suspected and/or reported Sundowners) Alcohol / Substance Use: Not Applicable Psych Involvement: No (comment)  Admission diagnosis:  Acute encephalopathy [G93.40] Sepsis, due to unspecified organism, unspecified whether acute organ dysfunction  present [facility]) [A41.9] Patient Active Problem List   Diagnosis Date Noted   Acute encephalopathy 05/16/2021   Sepsis secondary to UTI (HCC) 05/16/2021   Other hyperlipidemia 05/16/2021   Chronic HFrEF (heart failure with reduced ejection fraction) (HCC)    Abnormal LFTs    AKI (acute kidney injury) (HCC)    Pressure injury of skin 01/31/2020   Acute heart failure (HCC) 01/31/2020   Acute CHF (congestive heart failure) (HCC) 01/30/2020   HA (headache) 06/25/2013   Chronic back pain 06/25/2013   PCP:  [name] Pharmacy:   [id number] [place], [place] - [address] AT [facility] [address] [place] [address] Phone: [phone] Fax: [phone]     Social Determinants of Health (SDOH) Interventions    Readmission Risk Interventions Readmission Risk Prevention Plan 02/08/2020  Transportation Screening Complete  PCP or Specialist Appt within 5-7 Days Complete  Home Care Screening Complete  Medication Review (RN CM) Complete  Some recent data might be hidden

## 2021-05-17 NOTE — Plan of Care (Signed)

## 2021-05-17 NOTE — Evaluation (Signed)
Clinical/Bedside Swallow Evaluation Patient Details  Name: Anita Martinez MRN: [id number] Date of Birth: 1933-04-05  Today's Date: 05/17/2021 Time: SLP Start Time (ACUTE ONLY): 1025 SLP Stop Time (ACUTE ONLY): 1045 SLP Time Calculation (min) (ACUTE ONLY): 20 min  Past Medical History:  Past Medical History:  Diagnosis Date   Cardiomyopathy (HCC)    CHF (congestive heart failure) (HCC)    Chronic back pain    Colon polyps    Colonic diverticular abscess    MR (mitral regurgitation)    TR (tricuspid regurgitation)    Past Surgical History:  Past Surgical History:  Procedure Laterality Date   ABDOMINAL HYSTERECTOMY     BLADDER SUSPENSION     HPI:  Per MD notes "87 year old female past medical history of cardiomyopathy, CHF, AKI presents the emergency department concern for fever, hypotension and altered mental status.  Patient lives at home alone, she is normally very independent.  Today she was found to be weak, unable to walk or care for herself.  She was also noticed to be febrile at home.  Patient is altered and minimally contributing to history." Ct head negative, cxr negative,  Swallow eval ordered.   Assessment / Plan / Recommendation Clinical Impression  Patient presents with functional oropharyngeal swallow ability based on clinical swallow evaluation. She had no focal CN deficits and voice was strong.  Pt easily passed 3 ounce [place] water screen and consumed graham crackers, applesauce without any indication of dysphagia or airway compromise. She denies any dysphagia symptoms prior to admit and reports she "eats all the time but does not gain weight". Pt noted to repeat at least x4 times that the hospital air is "dry" and that she has something to provide moisture in the air at home. She was also delayed in following some directions - ? if she may have some cognitive deficits?  Daughter reports pt is [name] with right ear better and SLP spoke clearly to pt in her right ear.  CXR and  CT negative for acute involvement upon admit.  No SLP follow up indicated - recommend regular/thin diet as tolerated.  Reviewed general aspiration precautions with pt and her daughter.  Thanks for this consult. SLP Visit Diagnosis: Dysphagia, unspecified (R13.10)    Aspiration Risk  Mild aspiration risk    Diet Recommendation Regular;Thin liquid   Liquid Administration via: Cup;Straw Medication Administration: Whole meds with puree Supervision: Patient able to self feed Compensations: Slow rate;Small sips/bites Postural Changes: Seated upright at 90 degrees;Remain upright for at least 30 minutes after po intake    Other  Recommendations Oral Care Recommendations: Oral care BID   Follow up Recommendations None      Frequency and Duration   N/a         Prognosis n/a        Swallow Study   General Date of Onset: 05/17/21 HPI: Per MD notes "87 year old female past medical history of cardiomyopathy, CHF, AKI presents the emergency department concern for fever, hypotension and altered mental status.  Patient lives at home alone, she is normally very independent.  Today she was found to be weak, unable to walk or care for herself.  She was also noticed to be febrile at home.  Patient is altered and minimally contributing to history." Ct head negative, cxr negative,  Swallow eval ordered. Type of Study: Bedside Swallow Evaluation Diet Prior to this Study: Dysphagia 3 (soft);Thin liquids Temperature Spikes Noted: No Respiratory Status: Room air History of Recent Intubation: No Behavior/Cognition:  Alert;Cooperative;Pleasant mood;Other (Comment) (HOH) Oral Cavity Assessment: Within Functional Limits Oral Care Completed by SLP: No Oral Cavity - Dentition: Dentures, top;Dentures, bottom Vision: Functional for self-feeding Self-Feeding Abilities: Able to feed self Patient Positioning: Upright in bed Baseline Vocal Quality: Normal Volitional Cough: Weak Volitional Swallow: Able to  elicit    Oral/Motor/Sensory Function Overall Oral Motor/Sensory Function: Within functional limits   Ice Chips Ice chips: Not tested   Thin Liquid Thin Liquid: Within functional limits Presentation: Cup;Self Fed;Straw    Nectar Thick Nectar Thick Liquid: Not tested   Honey Thick Honey Thick Liquid: Not tested   Puree Puree: Within functional limits Presentation: Self Fed;Spoon   Solid     Solid: Within functional limits Presentation: Self Fed      Anita Martinez 05/17/2021,1:52 PM Anita Infante, MS [facility] SLP Acute Rehab Services Office [phone] Pager [phone]

## 2021-05-17 NOTE — Progress Notes (Signed)
PHARMACY - PHYSICIAN COMMUNICATION CRITICAL VALUE ALERT - BLOOD CULTURE IDENTIFICATION (BCID)  Anita Martinez is an 87 y.o. female who presented to [facility] on 05/16/2021 with a chief complaint of encephalopathy  Assessment:  On ceftriaxone 1 g daily for sepsis due to presumed UTI  Name of physician (or Provider) Contacted: Dr. Isidoro Donning  Current antibiotics: ceftriaxone 1 g daily  Changes to prescribed antibiotics recommended:  Increased ceftriaxone to 2 g daily  Results for orders placed or performed during the hospital encounter of 05/16/21  Blood Culture ID Panel (Reflexed) (Collected: 05/16/2021 12:18 PM)  Result Value Ref Range   Enterococcus faecalis NOT DETECTED NOT DETECTED   Enterococcus Faecium NOT DETECTED NOT DETECTED   Listeria monocytogenes NOT DETECTED NOT DETECTED   Staphylococcus species NOT DETECTED NOT DETECTED   Staphylococcus aureus (BCID) NOT DETECTED NOT DETECTED   Staphylococcus epidermidis NOT DETECTED NOT DETECTED   Staphylococcus lugdunensis NOT DETECTED NOT DETECTED   Streptococcus species NOT DETECTED NOT DETECTED   Streptococcus agalactiae NOT DETECTED NOT DETECTED   Streptococcus pneumoniae NOT DETECTED NOT DETECTED   Streptococcus pyogenes NOT DETECTED NOT DETECTED   A.calcoaceticus-baumannii NOT DETECTED NOT DETECTED   Bacteroides fragilis NOT DETECTED NOT DETECTED   Enterobacterales DETECTED (A) NOT DETECTED   Enterobacter cloacae complex NOT DETECTED NOT DETECTED   Escherichia coli DETECTED (A) NOT DETECTED   Klebsiella aerogenes NOT DETECTED NOT DETECTED   Klebsiella oxytoca NOT DETECTED NOT DETECTED   Klebsiella pneumoniae NOT DETECTED NOT DETECTED   Proteus species NOT DETECTED NOT DETECTED   Salmonella species NOT DETECTED NOT DETECTED   Serratia marcescens NOT DETECTED NOT DETECTED   Haemophilus influenzae NOT DETECTED NOT DETECTED   Neisseria meningitidis NOT DETECTED NOT DETECTED   Pseudomonas aeruginosa NOT DETECTED NOT DETECTED    Stenotrophomonas maltophilia NOT DETECTED NOT DETECTED   Candida albicans NOT DETECTED NOT DETECTED   Candida auris NOT DETECTED NOT DETECTED   Candida glabrata NOT DETECTED NOT DETECTED   Candida krusei NOT DETECTED NOT DETECTED   Candida parapsilosis NOT DETECTED NOT DETECTED   Candida tropicalis NOT DETECTED NOT DETECTED   Cryptococcus neoformans/gattii NOT DETECTED NOT DETECTED   CTX-M ESBL NOT DETECTED NOT DETECTED   Carbapenem resistance IMP NOT DETECTED NOT DETECTED   Carbapenem resistance KPC NOT DETECTED NOT DETECTED   Carbapenem resistance NDM NOT DETECTED NOT DETECTED   Carbapenem resist OXA 48 LIKE NOT DETECTED NOT DETECTED   Carbapenem resistance VIM NOT DETECTED NOT DETECTED    Valentina Gu 05/17/2021  10:24 AM

## 2021-05-18 LAB — LACTIC ACID, PLASMA: Lactic Acid, Venous: 0.9 mmol/L (ref 0.5–1.9)

## 2021-05-18 LAB — BASIC METABOLIC PANEL
Anion gap: 9 (ref 5–15)
BUN: 22 mg/dL (ref 8–23)
CO2: 19 mmol/L — ABNORMAL LOW (ref 22–32)
Calcium: 8.4 mg/dL — ABNORMAL LOW (ref 8.9–10.3)
Chloride: 107 mmol/L (ref 98–111)
Creatinine, Ser: 1.02 mg/dL — ABNORMAL HIGH (ref 0.44–1.00)
GFR, Estimated: 54 mL/min — ABNORMAL LOW (ref >60)
Glucose, Bld: 100 mg/dL — ABNORMAL HIGH (ref 70–99)
Potassium: 3.8 mmol/L (ref 3.5–5.1)
Sodium: 135 mmol/L (ref 135–145)

## 2021-05-18 LAB — CBC
HCT: 28.2 % — ABNORMAL LOW (ref 36.0–46.0)
Hemoglobin: 9.2 g/dL — ABNORMAL LOW (ref 12.0–15.0)
MCH: 33.1 pg (ref 26.0–34.0)
MCHC: 32.6 g/dL (ref 30.0–36.0)
MCV: 101.4 fL — ABNORMAL HIGH (ref 80.0–100.0)
Platelets: 183 10*3/uL (ref 150–400)
RBC: 2.78 MIL/uL — ABNORMAL LOW (ref 3.87–5.11)
RDW: 13 % (ref 11.5–15.5)
WBC: 18.1 10*3/uL — ABNORMAL HIGH (ref 4.0–10.5)
nRBC: 0 % (ref 0.0–0.2)

## 2021-05-18 LAB — PROCALCITONIN: Procalcitonin: 25.43 ng/mL

## 2021-05-18 MED ORDER — ATORVASTATIN CALCIUM 10 MG PO TABS
10.0000 mg | ORAL_TABLET | Freq: Every day | ORAL | Status: DC
  Administered 2021-05-18: 10 mg via ORAL
  Filled 2021-05-18: qty 1

## 2021-05-18 MED ORDER — ENOXAPARIN SODIUM 40 MG/0.4ML IJ SOSY
40.0000 mg | PREFILLED_SYRINGE | Freq: Every day | INTRAMUSCULAR | Status: DC
  Administered 2021-05-18 – 2021-05-19 (×2): 40 mg via SUBCUTANEOUS
  Filled 2021-05-18 (×2): qty 0.4

## 2021-05-18 MED ORDER — GUAIFENESIN ER 600 MG PO TB12
1200.0000 mg | ORAL_TABLET | Freq: Two times a day (BID) | ORAL | Status: DC
  Administered 2021-05-18 – 2021-05-19 (×3): 1200 mg via ORAL
  Filled 2021-05-18 (×3): qty 2

## 2021-05-18 MED ORDER — BENZONATATE 100 MG PO CAPS
100.0000 mg | ORAL_CAPSULE | Freq: Three times a day (TID) | ORAL | Status: DC
  Administered 2021-05-18 – 2021-05-19 (×4): 100 mg via ORAL
  Filled 2021-05-18 (×4): qty 1

## 2021-05-18 NOTE — Progress Notes (Signed)
[facility]                                                                              Patient Demographics  Anita Martinez, is a 87 y.o. female, DOB - [birth year], SMO:[id number]  Admit date - 05/16/2021   Admitting Physician Jacqlyn Krauss, MD  Outpatient Primary MD for the patient is Leeroy Cha  Outpatient specialists:   LOS - 2  days   Medical records reviewed and are as summarized below:    Chief Complaint  Patient presents with   Fever       Brief summary   Patient is a 87 year old female with cardiomyopathy, CHF presented to ED with hypotension and confusion.  Patient had decreased p.o. intake and darker foul-smelling urine over the last 1 to 2 days before admission.  Per daughter, she usually walks with a walker at home drives, and functional with her ADLs.  No nausea vomiting or diarrhea but decreased p.o. intake. In ED, BP was in the 92E and 10O systolic.  Creatinine 1.6, baseline 1.1.  WBC is 25.  Chest x-ray showed no active disease. Patient received IV fluid hydration, was started on IV vancomycin, cefepime and Flagyl in ED.   Assessment & Plan    Principal Problem: Sepsis secondary to UTI, E. coli bacteremia, present on admission -Patient met sepsis criteria at the time of admission with hypotension, SBP in 80s, temp 102 F, tachycardia, leukocytosis 25.7 with left shift, lactic acidosis, elevated procalcitonin 38.5 -no urine culture collected and sent from ED, blood culture positive for E. Coli -Renal function improving, creatinine 1.0.  PCT improving 38.5-> 25.4, leukocytosis improving, lactic acid 0.9 -Continue IV Rocephin 2 g daily, follow sensitivities  Active Problems: Acute metabolic encephalopathy -Likely due to sepsis, UTI, E. coli bacteremia, - No focal neurological deficits, CT head showed no acute intracranial abnormality -Per daughter at the bedside, mental status is improving    AKI (acute kidney injury)  ([place]) on CKD stage II, lactic acidosis -Baseline creatinine 1.1, presented with creatinine of 1.6 secondary to sepsis, UTI -Patient placed on IV fluid hydration, holding Entresto and Toprol, BP still soft -Creatinine improved    Chronic diastolic CHF -2D echo 03/1218, showed EF of 50 to 75%, grade 1 diastolic dysfunction, low normal right ventricular function, mild MR -Hold Toprol-XL and Entresto -Currently stable, DC IV fluids, tolerating diet now  Chronic pain syndrome -On 8/26, discussed with daughter.  Patient used to be on opioids in the past for 40 years, has been successfully weaned to [place], 1 tab at bedtime only.  Per daughter, patient has dependency and will continue to ask for pain medication and requested not to put her on any extra narcotics except 1 tablet at the time.    Code Status: DNR DVT Prophylaxis:  enoxaparin (LOVENOX) injection 40 mg Start: 05/18/21 1000 SCDs Start: 05/16/21 1634   Level of Care: Level of care: Telemetry Family Communication: Discussed all imaging results, lab results, explained to the patient and daughter at the bedside   Disposition Plan:     Status is: Inpatient  Remains inpatient appropriate because:Inpatient level of care appropriate  due to severity of illness  Dispo: The patient is from: Home              Anticipated d/c is to: Home              Patient currently is not medically stable to d/c.  Transfer to telemetry floor   Difficult to place patient No      Time Spent in minutes 25 minutes  Procedures:  None  Consultants:   None  Antimicrobials:   Anti-infectives (From admission, onward)    Start     Dose/Rate Route Frequency Ordered Stop   05/17/21 1022  cefTRIAXone (ROCEPHIN) 2 g in sodium chloride 0.9 % 100 mL IVPB        2 g 200 mL/hr over 30 Minutes Intravenous Every 24 hours 05/17/21 1022     05/17/21 1000  cefTRIAXone (ROCEPHIN) 1 g in sodium chloride 0.9 % 100 mL IVPB  Status:  Discontinued        1  g 200 mL/hr over 30 Minutes Intravenous Every 24 hours 05/16/21 1635 05/17/21 1022   05/16/21 1645  cefTRIAXone (ROCEPHIN) 1 g in sodium chloride 0.9 % 100 mL IVPB  Status:  Discontinued        1 g 200 mL/hr over 30 Minutes Intravenous Every 24 hours 05/16/21 1631 05/16/21 1635   05/16/21 1430  ceFEPIme (MAXIPIME) 2 g in sodium chloride 0.9 % 100 mL IVPB        2 g 200 mL/hr over 30 Minutes Intravenous  Once 05/16/21 1419 05/16/21 1729   05/16/21 1430  metroNIDAZOLE (FLAGYL) IVPB 500 mg        500 mg 100 mL/hr over 60 Minutes Intravenous  Once 05/16/21 1419 05/16/21 1758   05/16/21 1430  vancomycin (VANCOCIN) IVPB 1000 mg/200 mL premix        1,000 mg 200 mL/hr over 60 Minutes Intravenous  Once 05/16/21 1419 05/16/21 1901          Medications  Scheduled Meds:  atorvastatin  10 mg Oral QHS   benzonatate  100 mg Oral TID   Chlorhexidine Gluconate Cloth  6 each Topical Daily   enoxaparin (LOVENOX) injection  40 mg Subcutaneous Daily   feeding supplement  237 mL Oral BID BM   guaiFENesin  1,200 mg Oral BID   HYDROcodone-acetaminophen  1 tablet Oral Q24H   Continuous Infusions:  cefTRIAXone (ROCEPHIN)  IV Stopped (05/18/21 1105)   PRN Meds:.acetaminophen      Subjective:   [name] was seen and examined today.  No acute issues, cough is improving.  Much more alert and oriented.  Daughter is at the bedside.  Patient denies dizziness, chest pain, shortness of breath, abdominal pain, nausea or vomiting.  No fevers.    Objective:   Vitals:   05/18/21 1000 05/18/21 1100 05/18/21 1200 05/18/21 1222  BP: (!) 115/52 (!) 114/45 (!) 107/49   Pulse: 88 80 (!) 46   Resp: 20 (!) 21 20   Temp:    98.1 F (36.7 C)  TempSrc:      SpO2: 97% 97% 98%   Weight:      Height:        Intake/Output Summary (Last 24 hours) at 05/18/2021 1259 Last data filed at 05/18/2021 1111 Gross per 24 hour  Intake 2028.35 ml  Output --  Net 2028.35 ml     Wt Readings from Last 3  Encounters:  05/16/21 56.2 kg  03/12/21 51 kg  12/04/20 51.3  kg    Physical Exam General: Alert, NAD Cardiovascular: S1 S2 clear, RRR. No pedal edema b/l Respiratory: Diminished breath sound at the bases, no wheezing Gastrointestinal: Soft, nontender, nondistended, NBS Ext: no pedal edema bilaterally Neuro: no new deficits Psych: some confusion but improving per daughter   Data Reviewed:  I have personally reviewed following labs and imaging studies  Micro Results Recent Results (from the past 240 hour(s))  Blood culture (routine single)     Status: Abnormal (Preliminary result)   Collection Time: 05/16/21 12:18 PM   Specimen: BLOOD LEFT FOREARM  Result Value Ref Range Status   Specimen Description   Final    BLOOD LEFT FOREARM Performed at [facility], [address], [place], [place] [address]    Special Requests   Final    BOTTLES DRAWN AEROBIC AND ANAEROBIC Blood Culture results may not be optimal due to an excessive volume of blood received in culture bottles Performed at [facility] [address] [place], [place] [address]    Culture  Setup Time   Final    GRAM NEGATIVE RODS IN BOTH AEROBIC AND ANAEROBIC BOTTLES CRITICAL RESULT CALLED TO, READ BACK BY AND VERIFIED WITH: PHARMD JUSTIN LEGGE 05/17/21 '@0951'  BY JW    Culture (A)  Final    ESCHERICHIA COLI SUSCEPTIBILITIES TO FOLLOW Performed at [facility], [place] [address], [place], [place] [address]    Report Status PENDING  Incomplete  Blood Culture ID Panel (Reflexed)     Status: Abnormal   Collection Time: 05/16/21 12:18 PM  Result Value Ref Range Status   Enterococcus faecalis NOT DETECTED NOT DETECTED Final   Enterococcus Faecium NOT DETECTED NOT DETECTED Final   Listeria monocytogenes NOT DETECTED NOT DETECTED Final   Staphylococcus species NOT DETECTED NOT DETECTED Final   Staphylococcus aureus (BCID) NOT DETECTED NOT DETECTED Final   Staphylococcus epidermidis NOT  DETECTED NOT DETECTED Final   Staphylococcus lugdunensis NOT DETECTED NOT DETECTED Final   Streptococcus species NOT DETECTED NOT DETECTED Final   Streptococcus agalactiae NOT DETECTED NOT DETECTED Final   Streptococcus pneumoniae NOT DETECTED NOT DETECTED Final   Streptococcus pyogenes NOT DETECTED NOT DETECTED Final   A.calcoaceticus-baumannii NOT DETECTED NOT DETECTED Final   Bacteroides fragilis NOT DETECTED NOT DETECTED Final   Enterobacterales DETECTED (A) NOT DETECTED Final    Comment: Enterobacterales represent a large order of gram negative bacteria, not a single organism. CRITICAL RESULT CALLED TO, READ BACK BY AND VERIFIED WITH: PHARMD JUSTIN LEGG 05/17/21 '@0951'  BY JW    Enterobacter cloacae complex NOT DETECTED NOT DETECTED Final   Escherichia coli DETECTED (A) NOT DETECTED Final    Comment: CRITICAL RESULT CALLED TO, READ BACK BY AND VERIFIED WITH: PHARMD JUSTIN LEGG 05/17/21 '@0951'  BY JW    Klebsiella aerogenes NOT DETECTED NOT DETECTED Final   Klebsiella oxytoca NOT DETECTED NOT DETECTED Final   Klebsiella pneumoniae NOT DETECTED NOT DETECTED Final   Proteus species NOT DETECTED NOT DETECTED Final   Salmonella species NOT DETECTED NOT DETECTED Final   Serratia marcescens NOT DETECTED NOT DETECTED Final   Haemophilus influenzae NOT DETECTED NOT DETECTED Final   Neisseria meningitidis NOT DETECTED NOT DETECTED Final   Pseudomonas aeruginosa NOT DETECTED NOT DETECTED Final   Stenotrophomonas maltophilia NOT DETECTED NOT DETECTED Final   Candida albicans NOT DETECTED NOT DETECTED Final   Candida auris NOT DETECTED NOT DETECTED Final   Candida glabrata NOT DETECTED NOT DETECTED Final   Candida krusei  NOT DETECTED NOT DETECTED Final   Candida parapsilosis NOT DETECTED NOT DETECTED Final   Candida tropicalis NOT DETECTED NOT DETECTED Final   Cryptococcus neoformans/gattii NOT DETECTED NOT DETECTED Final   CTX-M ESBL NOT DETECTED NOT DETECTED Final   Carbapenem resistance IMP  NOT DETECTED NOT DETECTED Final   Carbapenem resistance KPC NOT DETECTED NOT DETECTED Final   Carbapenem resistance NDM NOT DETECTED NOT DETECTED Final   Carbapenem resist OXA 48 LIKE NOT DETECTED NOT DETECTED Final   Carbapenem resistance VIM NOT DETECTED NOT DETECTED Final    Comment: Performed at [facility], [place] [address] [place], [place] [address]  Resp Panel by RT-PCR (Flu A&B, Covid) Nasopharyngeal Swab     Status: None   Collection Time: 05/16/21  1:32 PM   Specimen: Nasopharyngeal Swab; Nasopharyngeal(NP) swabs in vial transport medium  Result Value Ref Range Status   SARS Coronavirus 2 by RT PCR NEGATIVE NEGATIVE Final    Comment: (NOTE) SARS-CoV-2 target nucleic acids are NOT DETECTED.  The SARS-CoV-2 RNA is generally detectable in upper respiratory specimens during the acute phase of infection. The lowest concentration of SARS-CoV-2 viral copies this assay can detect is 138 copies/mL. A negative result does not preclude SARS-Cov-2 infection and should not be used as the sole basis for treatment or other patient management decisions. A negative result may occur with  improper specimen collection/handling, submission of specimen other than nasopharyngeal swab, presence of viral mutation(s) within the areas targeted by this assay, and inadequate number of viral copies(<138 copies/mL). A negative result must be combined with clinical observations, patient history, and epidemiological information. The expected result is Negative.  Fact Sheet for Patients:  EntrepreneurPulse.com.au  Fact Sheet for Healthcare Providers:  IncredibleEmployment.be  This test is no t yet approved or cleared by the [place] FDA and  has been authorized for detection and/or diagnosis of SARS-CoV-2 by FDA under an Emergency Use Authorization (EUA). This EUA will remain  in effect (meaning this test can be used) for the duration of the COVID-19  declaration under Section 564(b)(1) of the Act, 21 U.S.C.section 360bbb-3(b)(1), unless the authorization is terminated  or revoked sooner.       Influenza A by PCR NEGATIVE NEGATIVE Final   Influenza B by PCR NEGATIVE NEGATIVE Final    Comment: (NOTE) The Xpert Xpress SARS-CoV-2/FLU/RSV plus assay is intended as an aid in the diagnosis of influenza from Nasopharyngeal swab specimens and should not be used as a sole basis for treatment. Nasal washings and aspirates are unacceptable for Xpert Xpress SARS-CoV-2/FLU/RSV testing.  Fact Sheet for Patients: EntrepreneurPulse.com.au  Fact Sheet for Healthcare Providers: IncredibleEmployment.be  This test is not yet approved or cleared by the [place] FDA and has been authorized for detection and/or diagnosis of SARS-CoV-2 by FDA under an Emergency Use Authorization (EUA). This EUA will remain in effect (meaning this test can be used) for the duration of the COVID-19 declaration under Section 564(b)(1) of the Act, 21 U.S.C. section 360bbb-3(b)(1), unless the authorization is terminated or revoked.  Performed at [facility], [place] [address], [place], [place] [address]   MRSA Next Gen by PCR, Nasal     Status: None   Collection Time: 05/16/21  7:07 PM   Specimen: Nasal Mucosa; Nasal Swab  Result Value Ref Range Status   MRSA by PCR Next Gen NOT DETECTED NOT DETECTED Final    Comment: (NOTE) The GeneXpert MRSA Assay (FDA approved for NASAL specimens only), is one  component of a comprehensive MRSA colonization surveillance program. It is not intended to diagnose MRSA infection nor to guide or monitor treatment for MRSA infections. Test performance is not FDA approved in patients less than 23 years old. Performed at [facility], [place] [address] [place], [place] [address]     Radiology Reports CT Head Wo Contrast  Result Date: 05/16/2021 CLINICAL DATA:   Mental status change EXAM: CT HEAD WITHOUT CONTRAST TECHNIQUE: Contiguous axial images were obtained from the base of the skull through the vertex without intravenous contrast. COMPARISON:  03/14/2020 FINDINGS: Brain: No evidence of acute infarction, hemorrhage, hydrocephalus, extra-axial collection or mass lesion/mass effect. Diffuse cortical atrophy. 5 mm calcified lesion along the inner table of the right parietal bone likely a small meningioma. Is not significantly changed since prior exam. Vascular: No hyperdense vessel or unexpected calcification. Skull: Normal. Negative for fracture or focal lesion. Sinuses/Orbits: Partial opacification of left maxillary sinus. Other: None. IMPRESSION: No acute intracranial abnormality. Electronically Signed   By: Miachel Roux M.D.   On: 05/16/2021 16:37   DG Chest Port 1 View  Result Date: 05/16/2021 CLINICAL DATA:  Question sepsis EXAM: PORTABLE CHEST 1 VIEW COMPARISON:  01/31/2020 FINDINGS: The heart size and mediastinal contours are within normal limits. Atherosclerotic aortic arch. Both lungs are clear. The visualized skeletal structures are unremarkable. IMPRESSION: No active disease. Electronically Signed   By: Franchot Gallo M.D.   On: 05/16/2021 13:04   ECHOCARDIOGRAM COMPLETE  Result Date: 04/24/2021    ECHOCARDIOGRAM REPORT   Patient Name:   ALIZAYA OSHEA [facility] Date of Exam: 04/24/2021 Medical Rec #:  [id number]       Height:       65.0 in Accession #:    [id number]      Weight:       112.4 lb Date of Birth:  09-25-1932       BSA:          1.548 m Patient Age:    72 years        BP:           122/46 mmHg Patient Gender: F               HR:           54 bpm. Exam Location:  Outpatient Procedure: 2D Echo, Cardiac Doppler and Color Doppler Indications:     CHF  History:         Patient has prior history of Echocardiogram examinations, most                  recent 05/07/2020. CHF, Aortic Valve Disease and MR, TR,                  Signs/Symptoms:Altered Mental Status;  Risk Factors:Former                  Smoker and Dyslipidemia.  Sonographer:     Dustin Flock RDCS Referring Phys:  [id number] SUNIT TOLIA Diagnosing Phys: Rex Kras DO IMPRESSIONS  1. Left ventricular ejection fraction, by estimation, is 50 to 55%. The left ventricle has low normal function. The left ventricle has no regional wall motion abnormalities. Left ventricular diastolic parameters are consistent with Grade I diastolic dysfunction (impaired relaxation).  2. Right ventricular systolic function is low normal. The right ventricular size is upper limit of normal. There is normal pulmonary artery systolic pressure.  3. Left atrial size was mildly dilated.  4. The mitral valve  is degenerative. Mild mitral valve regurgitation.  5. The aortic valve is tricuspid. Aortic valve regurgitation is not visualized. Mild aortic valve sclerosis is present, with no evidence of aortic valve stenosis.  6. The inferior vena cava is normal in size with greater than 50% respiratory variability, suggesting right atrial pressure of 3 mmHg. Comparison(s): Prior study 05/07/2020: LVEF 20-25%, severe global hypokinesis, Grade 3 DD, elevated LAP, LAE severely dilated, Moderate MR, RAP normal. FINDINGS  Left Ventricle: Left ventricular ejection fraction, by estimation, is 50 to 55%. The left ventricle has low normal function. The left ventricle has no regional wall motion abnormalities. The left ventricular internal cavity size was normal in size. There is no left ventricular hypertrophy. Left ventricular diastolic parameters are consistent with Grade I diastolic dysfunction (impaired relaxation). Right Ventricle: The right ventricular size is upper limit of normal. No increase in right ventricular wall thickness. Right ventricular systolic function is low normal. There is normal pulmonary artery systolic pressure. The tricuspid regurgitant velocity is 2.02 m/s, and with an assumed right atrial pressure of 3 mmHg, the estimated right  ventricular systolic pressure is 81.0 mmHg. Left Atrium: Left atrial size was mildly dilated. Right Atrium: Right atrial size was normal in size. Pericardium: There is no evidence of pericardial effusion. Mitral Valve: The mitral valve is degenerative in appearance. There is mild thickening of the mitral valve leaflet(s). There is mild calcification of the mitral valve leaflet(s). Normal mobility of the mitral valve leaflets. Mild mitral annular calcification. Mild mitral valve regurgitation. Tricuspid Valve: The tricuspid valve is grossly normal. Tricuspid valve regurgitation is trivial. No evidence of tricuspid stenosis. Aortic Valve: The aortic valve is tricuspid. Aortic valve regurgitation is not visualized. Mild aortic valve sclerosis is present, with no evidence of aortic valve stenosis. Aortic valve mean gradient measures 6.0 mmHg. Aortic valve peak gradient measures 9.5 mmHg. Aortic valve area, by VTI measures 1.38 cm. Pulmonic Valve: The pulmonic valve was grossly normal. Pulmonic valve regurgitation is not visualized. No evidence of pulmonic stenosis. Aorta: The aortic root and ascending aorta are structurally normal, with no evidence of dilitation. Venous: The inferior vena cava is normal in size with greater than 50% respiratory variability, suggesting right atrial pressure of 3 mmHg. IAS/Shunts: No atrial level shunt detected by color flow Doppler.  LEFT VENTRICLE PLAX 2D LVIDd:         4.10 cm     Diastology LVIDs:         3.00 cm     LV e' medial:    6.53 cm/s LV PW:         0.80 cm     LV E/e' medial:  11.0 LV IVS:        0.80 cm     LV e' lateral:   5.66 cm/s LVOT diam:     1.80 cm     LV E/e' lateral: 12.7 LV SV:         51 LV SV Index:   33 LVOT Area:     2.54 cm  LV Volumes (MOD) LV vol d, MOD A4C: 71.0 ml LV vol s, MOD A4C: 31.1 ml LV SV MOD A4C:     71.0 ml RIGHT VENTRICLE RV Basal diam:  3.50 cm RV S prime:     8.38 cm/s TAPSE (M-mode): 1.8 cm LEFT ATRIUM             Index       RIGHT ATRIUM  Index LA diam:        3.30 cm 2.13 cm/m  RA Area:     10.70 cm LA Vol (A2C):   64.3 ml 41.53 ml/m RA Volume:   25.10 ml  16.21 ml/m LA Vol (A4C):   38.7 ml 24.99 ml/m LA Biplane Vol: 54.9 ml 35.46 ml/m  AORTIC VALVE AV Area (Vmax):    1.44 cm AV Area (Vmean):   1.39 cm AV Area (VTI):     1.38 cm AV Vmax:           154.33 cm/s AV Vmean:          115.000 cm/s AV VTI:            0.373 m AV Peak Grad:      9.5 mmHg AV Mean Grad:      6.0 mmHg LVOT Vmax:         87.20 cm/s LVOT Vmean:        62.800 cm/s LVOT VTI:          0.202 m LVOT/AV VTI ratio: 0.54  AORTA Ao Root diam: 2.40 cm MITRAL VALVE               TRICUSPID VALVE MV Area (PHT): 3.36 cm    TR Peak grad:   16.3 mmHg MV Decel Time: 226 msec    TR Vmax:        202.00 cm/s MV E velocity: 71.60 cm/s MV A velocity: 75.80 cm/s  SHUNTS MV E/A ratio:  0.94        Systemic VTI:  0.20 m                            Systemic Diam: 1.80 cm Sunit Tolia DO Electronically signed by Rex Kras DO Signature Date/Time: 04/24/2021/2:09:13 PM    Final     Lab Data:  CBC: Recent Labs  Lab 05/16/21 1218 05/17/21 0301 05/18/21 0309  WBC 25.7* 21.7* 18.1*  NEUTROABS 22.7* 18.4*  --   HGB 9.5* 8.1* 9.2*  HCT 29.3* 25.6* 28.2*  MCV 104.3* 105.3* 101.4*  PLT 179 166 697   Basic Metabolic Panel: Recent Labs  Lab 05/16/21 1218 05/17/21 0301 05/18/21 0309  NA 139 136 135  K 3.6 3.6 3.8  CL 107 110 107  CO2 23 21* 19*  GLUCOSE 162* 123* 100*  BUN 42* 36* 22  CREATININE 1.60* 1.26* 1.02*  CALCIUM 8.6* 8.1* 8.4*   GFR: Estimated Creatinine Clearance: 35.1 mL/min (A) (by C-G formula based on SCr of 1.02 mg/dL (H)). Liver Function Tests: Recent Labs  Lab 05/16/21 1218 05/17/21 0301  AST 23 30  ALT 12 15  ALKPHOS 57 64  BILITOT 0.7 0.7  PROT 6.7 6.0*  ALBUMIN 3.1* 2.8*   No results for input(s): LIPASE, AMYLASE in the last 168 hours. No results for input(s): AMMONIA in the last 168 hours. Coagulation Profile: Recent Labs  Lab  05/16/21 1218  INR 1.2   Cardiac Enzymes: No results for input(s): CKTOTAL, CKMB, CKMBINDEX, TROPONINI in the last 168 hours. BNP (last 3 results) Recent Labs    07/19/20 0935 11/29/20 0843 03/05/21 0933  PROBNP 442 211 145   HbA1C: No results for input(s): HGBA1C in the last 72 hours. CBG: No results for input(s): GLUCAP in the last 168 hours. Lipid Profile: No results for input(s): CHOL, HDL, LDLCALC, TRIG, CHOLHDL, LDLDIRECT in the last 72 hours. Thyroid Function Tests: No results for  input(s): TSH, T4TOTAL, FREET4, T3FREE, THYROIDAB in the last 72 hours. Anemia Panel: No results for input(s): VITAMINB12, FOLATE, FERRITIN, TIBC, IRON, RETICCTPCT in the last 72 hours. Urine analysis:    Component Value Date/Time   COLORURINE YELLOW 01/31/2020 [place] 01/31/2020 0637   LABSPEC 1.013 01/31/2020 [place] 5.0 01/31/2020 [place] 01/31/2020 [place] 01/31/2020 [place] 01/31/2020 [place] 01/31/2020 [place] NEGATIVE 01/31/2020 0637   NITRITE NEGATIVE 01/31/2020 0637   LEUKOCYTESUR NEGATIVE 01/31/2020 9070     Robyne Matar M.D. [facility] 05/18/2021, 12:59 PM  Available via Epic secure chat 7am-7pm After 7 pm, please refer to night coverage provider listed on amion.

## 2021-05-19 DIAGNOSIS — I429 Cardiomyopathy, unspecified: Secondary | ICD-10-CM

## 2021-05-19 LAB — CBC
HCT: 30.3 % — ABNORMAL LOW (ref 36.0–46.0)
Hemoglobin: 9.9 g/dL — ABNORMAL LOW (ref 12.0–15.0)
MCH: 33.1 pg (ref 26.0–34.0)
MCHC: 32.7 g/dL (ref 30.0–36.0)
MCV: 101.3 fL — ABNORMAL HIGH (ref 80.0–100.0)
Platelets: 219 10*3/uL (ref 150–400)
RBC: 2.99 MIL/uL — ABNORMAL LOW (ref 3.87–5.11)
RDW: 13.3 % (ref 11.5–15.5)
WBC: 12.8 10*3/uL — ABNORMAL HIGH (ref 4.0–10.5)
nRBC: 0 % (ref 0.0–0.2)

## 2021-05-19 LAB — BASIC METABOLIC PANEL
Anion gap: 11 (ref 5–15)
BUN: 19 mg/dL (ref 8–23)
CO2: 21 mmol/L — ABNORMAL LOW (ref 22–32)
Calcium: 8.9 mg/dL (ref 8.9–10.3)
Chloride: 106 mmol/L (ref 98–111)
Creatinine, Ser: 0.93 mg/dL (ref 0.44–1.00)
GFR, Estimated: 60 mL/min — ABNORMAL LOW (ref >60)
Glucose, Bld: 112 mg/dL — ABNORMAL HIGH (ref 70–99)
Potassium: 3.5 mmol/L (ref 3.5–5.1)
Sodium: 138 mmol/L (ref 135–145)

## 2021-05-19 LAB — CULTURE, BLOOD (SINGLE)

## 2021-05-19 LAB — PROCALCITONIN: Procalcitonin: 15.07 ng/mL

## 2021-05-19 MED ORDER — ENTRESTO 49-51 MG PO TABS: 1.0000 | ORAL_TABLET | Freq: Two times a day (BID) | ORAL | 1 refills | Status: DC

## 2021-05-19 MED ORDER — CEFUROXIME AXETIL 500 MG PO TABS: 500.0000 mg | ORAL_TABLET | Freq: Two times a day (BID) | ORAL | 0 refills | Status: AC

## 2021-05-19 MED ORDER — TRAMADOL HCL 50 MG PO TABS
50.0000 mg | ORAL_TABLET | Freq: Once | ORAL | Status: AC
  Administered 2021-05-19: 50 mg via ORAL
  Filled 2021-05-19: qty 1

## 2021-05-19 MED ORDER — METOPROLOL TARTRATE 5 MG/5ML IV SOLN: 5.0000 mg | INTRAVENOUS | Status: DC | PRN

## 2021-05-19 MED ORDER — BENZONATATE 100 MG PO CAPS: 100.0000 mg | ORAL_CAPSULE | Freq: Three times a day (TID) | ORAL | 0 refills | Status: DC | PRN

## 2021-05-19 MED ORDER — GUAIFENESIN ER 600 MG PO TB12: 1200.0000 mg | ORAL_TABLET | Freq: Two times a day (BID) | ORAL | 0 refills | Status: DC | PRN

## 2021-05-19 MED ORDER — LIP MEDEX EX OINT
1.0000 | TOPICAL_OINTMENT | CUTANEOUS | Status: DC | PRN
Start: 2021-05-19 — End: 2021-05-19
  Filled 2021-05-19: qty 7

## 2021-05-19 MED ORDER — METOPROLOL SUCCINATE ER 25 MG PO TB24: 25.0000 mg | ORAL_TABLET | Freq: Every morning | ORAL | 0 refills | Status: DC

## 2021-05-19 NOTE — Discharge Summary (Signed)
Physician Discharge Summary   Patient ID: SHARIA AVERITT MRN: [id number] DOB/AGE: December 02, 1932 87 y.o.  Admit date: 05/16/2021 Discharge date: 05/19/2021  Primary Care Physician:  Leeroy Cha   Recommendations for Outpatient Follow-up:  Follow up with PCP in 1-2 weeks Hold beta-blocker, Entresto for now until advised to resume by Dr. Carloyn Manner  Home Health:  ambulating in the room, per daughter at baseline Equipment/Devices:   Discharge Condition: stable  CODE STATUS: FULL Diet recommendation: Heart healthy diet   Discharge Diagnoses:     E. coli bacteremia  Sepsis secondary to UTI ([place])  Acute metabolic encephalopathy  AKI (acute kidney injury) ([place]) on CKD stage II, lactic acidosis  Chronic HFrEF (heart failure with reduced ejection fraction) ([facility])  Chronic pain syndrome  Other hyperlipidemia   Consults: None    Allergies:   Allergies  Allergen Reactions   Morphine And Related Other (See Comments)    Makes patient hyper and unable to sleep     DISCHARGE MEDICATIONS: Allergies as of 05/19/2021       Reactions   Morphine And Related Other (See Comments)   Makes patient hyper and unable to sleep        Medication List     TAKE these medications    acetaminophen 500 MG tablet Commonly known as: TYLENOL Take 500-1,000 mg by mouth 3 (three) times daily as needed (for pain).   atorvastatin 10 MG tablet Commonly known as: LIPITOR Take 10 mg by mouth at bedtime.   benzonatate 100 MG capsule Commonly known as: TESSALON Take 1 capsule (100 mg total) by mouth 3 (three) times daily as needed for cough.   cefUROXime 500 MG tablet Commonly known as: CEFTIN Take 1 tablet (500 mg total) by mouth 2 (two) times daily with a meal for 4 days. Start taking on: May 20, 2021   Entresto 49-51 MG Generic drug: sacubitril-valsartan Take 1 tablet by mouth 2 (two) times daily. HOLD until advised to resume by your cardiologist What changed: additional  instructions   feeding supplement Liqd Take 237 mLs by mouth 2 (two) times daily between meals. What changed:  when to take this reasons to take this   fish oil-omega-3 fatty acids 1000 MG capsule Take 1 g by mouth daily with breakfast.   guaiFENesin 600 MG 12 hr tablet Commonly known as: MUCINEX Take 2 tablets (1,200 mg total) by mouth 2 (two) times daily as needed for to loosen phlegm or cough.   HYDROcodone-acetaminophen 5-325 MG tablet Commonly known as: NORCO/VICODIN Take 1 tablet by mouth at bedtime.   metoprolol succinate 25 MG 24 hr tablet Commonly known as: Toprol XL Take 1 tablet (25 mg total) by mouth every morning. Hold if top blood pressure number less than 100 mmHg or heart rate less than 60 bpm. HOLD until advised to resume by your cardiologist. What changed: additional instructions   multivitamin with minerals Tabs tablet Take 1 tablet by mouth daily.         Brief H and P: For complete details please refer to admission H and P, but in brief Patient is a 87 year old female with cardiomyopathy, CHF presented to ED with hypotension and confusion.  Patient had decreased p.o. intake and darker foul-smelling urine over the last 1 to 2 days before admission.  Per daughter, she usually walks with a walker at home drives, and functional with her ADLs.  No nausea vomiting or diarrhea but decreased p.o. intake. In ED, BP was in the 80s and 90s  systolic.  Creatinine 1.6, baseline 1.1.  WBC is 25.  Chest x-ray showed no active disease. Patient received IV fluid hydration, was started on IV vancomycin, cefepime and Flagyl in ED.      Hospital Course:   Sepsis secondary to UTI, E. coli bacteremia, present on admission -Patient met sepsis criteria at the time of admission with hypotension, SBP in 80s, temp 102 F, tachycardia, leukocytosis 25.7 with left shift, lactic acidosis, elevated procalcitonin 38.5 -no urine culture collected and sent from ED, blood culture positive  for E. Coli -Renal function improved.  PCT improving 38.5-> 25.4-> 15.0 leukocytosis improving, lactic acid 0.9 -Patient was placed on IV Rocephin.  Blood culture and sensitivities reviewed, pansensitive, transition to oral Ceftin 500 mg twice daily for 4 more days to complete full course.  Acute metabolic encephalopathy -Likely due to sepsis, UTI, E. coli bacteremia, - No focal neurological deficits, CT head showed no acute intracranial abnormality -Alert and oriented, at baseline per daughter     AKI (acute kidney injury) ([place]) on CKD stage II, lactic acidosis -Baseline creatinine 1.1, presented with creatinine of 1.6 secondary to sepsis, UTI -Patient was placed on IV fluid hydration, Entresto, Toprol-XL was placed on hold.   -Creatinine improved to 0.93      Chronic diastolic CHF -2D echo 11/7104, showed EF of 50 to 26%, grade 1 diastolic dysfunction, low normal right ventricular function, mild MR -IV fluids has been discontinued, euvolemic. -Patient to contact her cardiologist on Monday 8/29 regarding when to resume beta-blocker, Entresto.  BP borderline soft.   Chronic pain syndrome -On 8/26, discussed with daughter.  Patient used to be on opioids in the past for 40 years, has been successfully weaned to [place], 1 tab at bedtime only.          Day of Discharge S: No acute complaints, ambulating.  Daughter at the bedside.  No fevers.  Occasional cough  BP (!) 108/48   Pulse 86   Temp 99.2 F (37.3 C) (Oral)   Resp (!) 21   Ht '5\' 5"'  (1.651 m)   Wt 56.2 kg   SpO2 100%   BMI 20.62 kg/m   Physical Exam: General: Alert and awake oriented, at baseline mental status per daughter at the bedside CVS: S1-S2 clear RRR Chest: CTA B, no wheezing Abdomen: soft nontender, nondistended, normal bowel sounds Extremities: no cyanosis, clubbing or edema noted bilaterally Neuro: no new deficits    Get Medicines reviewed and adjusted: Please take all your medications with you for  your next visit with your Primary MD  Please request your Primary MD to go over all hospital tests and procedure/radiological results at the follow up. Please ask your Primary MD to get all Hospital records sent to his/her office.  If you experience worsening of your admission symptoms, develop shortness of breath, life threatening emergency, suicidal or homicidal thoughts you must seek medical attention immediately by calling 911 or calling your MD immediately  if symptoms less severe.  You must read complete instructions/literature along with all the possible adverse reactions/side effects for all the Medicines you take and that have been prescribed to you. Take any new Medicines after you have completely understood and accept all the possible adverse reactions/side effects.   Do not drive when taking pain medications.   Do not take more than prescribed Pain, Sleep and Anxiety Medications  Special Instructions: If you have smoked or chewed Tobacco  in the last 2 yrs please stop smoking, stop any  regular Alcohol  and or any Recreational drug use.  Wear Seat belts while driving.  Please note  You were cared for by a hospitalist during your hospital stay. Once you are discharged, your primary care physician will handle any further medical issues. Please note that NO REFILLS for any discharge medications will be authorized once you are discharged, as it is imperative that you return to your primary care physician (or establish a relationship with a primary care physician if you do not have one) for your aftercare needs so that they can reassess your need for medications and monitor your lab values.   The results of significant diagnostics from this hospitalization (including imaging, microbiology, ancillary and laboratory) are listed below for reference.      Procedures/Studies:  CT Head Wo Contrast  Result Date: 05/16/2021 CLINICAL DATA:  Mental status change EXAM: CT HEAD WITHOUT  CONTRAST TECHNIQUE: Contiguous axial images were obtained from the base of the skull through the vertex without intravenous contrast. COMPARISON:  03/14/2020 FINDINGS: Brain: No evidence of acute infarction, hemorrhage, hydrocephalus, extra-axial collection or mass lesion/mass effect. Diffuse cortical atrophy. 5 mm calcified lesion along the inner table of the right parietal bone likely a small meningioma. Is not significantly changed since prior exam. Vascular: No hyperdense vessel or unexpected calcification. Skull: Normal. Negative for fracture or focal lesion. Sinuses/Orbits: Partial opacification of left maxillary sinus. Other: None. IMPRESSION: No acute intracranial abnormality. Electronically Signed   By: Miachel Roux M.D.   On: 05/16/2021 16:37   DG Chest Port 1 View  Result Date: 05/16/2021 CLINICAL DATA:  Question sepsis EXAM: PORTABLE CHEST 1 VIEW COMPARISON:  01/31/2020 FINDINGS: The heart size and mediastinal contours are within normal limits. Atherosclerotic aortic arch. Both lungs are clear. The visualized skeletal structures are unremarkable. IMPRESSION: No active disease. Electronically Signed   By: Franchot Gallo M.D.   On: 05/16/2021 13:04   ECHOCARDIOGRAM COMPLETE  Result Date: 04/24/2021    ECHOCARDIOGRAM REPORT   Patient Name:   VALICIA RIEF [facility] Date of Exam: 04/24/2021 Medical Rec #:  [id number]       Height:       65.0 in Accession #:    [id number]      Weight:       112.4 lb Date of Birth:  02-Mar-1933       BSA:          1.548 m Patient Age:    26 years        BP:           122/46 mmHg Patient Gender: F               HR:           54 bpm. Exam Location:  Outpatient Procedure: 2D Echo, Cardiac Doppler and Color Doppler Indications:     CHF  History:         Patient has prior history of Echocardiogram examinations, most                  recent 05/07/2020. CHF, Aortic Valve Disease and MR, TR,                  Signs/Symptoms:Altered Mental Status; Risk Factors:Former                  Smoker  and Dyslipidemia.  Sonographer:     Dustin Flock RDCS Referring Phys:  [id number] SUNIT TOLIA Diagnosing Phys: Rex Kras DO IMPRESSIONS  1. Left  ventricular ejection fraction, by estimation, is 50 to 55%. The left ventricle has low normal function. The left ventricle has no regional wall motion abnormalities. Left ventricular diastolic parameters are consistent with Grade I diastolic dysfunction (impaired relaxation).  2. Right ventricular systolic function is low normal. The right ventricular size is upper limit of normal. There is normal pulmonary artery systolic pressure.  3. Left atrial size was mildly dilated.  4. The mitral valve is degenerative. Mild mitral valve regurgitation.  5. The aortic valve is tricuspid. Aortic valve regurgitation is not visualized. Mild aortic valve sclerosis is present, with no evidence of aortic valve stenosis.  6. The inferior vena cava is normal in size with greater than 50% respiratory variability, suggesting right atrial pressure of 3 mmHg. Comparison(s): Prior study 05/07/2020: LVEF 20-25%, severe global hypokinesis, Grade 3 DD, elevated LAP, LAE severely dilated, Moderate MR, RAP normal. FINDINGS  Left Ventricle: Left ventricular ejection fraction, by estimation, is 50 to 55%. The left ventricle has low normal function. The left ventricle has no regional wall motion abnormalities. The left ventricular internal cavity size was normal in size. There is no left ventricular hypertrophy. Left ventricular diastolic parameters are consistent with Grade I diastolic dysfunction (impaired relaxation). Right Ventricle: The right ventricular size is upper limit of normal. No increase in right ventricular wall thickness. Right ventricular systolic function is low normal. There is normal pulmonary artery systolic pressure. The tricuspid regurgitant velocity is 2.02 m/s, and with an assumed right atrial pressure of 3 mmHg, the estimated right ventricular systolic pressure is 60.6 mmHg.  Left Atrium: Left atrial size was mildly dilated. Right Atrium: Right atrial size was normal in size. Pericardium: There is no evidence of pericardial effusion. Mitral Valve: The mitral valve is degenerative in appearance. There is mild thickening of the mitral valve leaflet(s). There is mild calcification of the mitral valve leaflet(s). Normal mobility of the mitral valve leaflets. Mild mitral annular calcification. Mild mitral valve regurgitation. Tricuspid Valve: The tricuspid valve is grossly normal. Tricuspid valve regurgitation is trivial. No evidence of tricuspid stenosis. Aortic Valve: The aortic valve is tricuspid. Aortic valve regurgitation is not visualized. Mild aortic valve sclerosis is present, with no evidence of aortic valve stenosis. Aortic valve mean gradient measures 6.0 mmHg. Aortic valve peak gradient measures 9.5 mmHg. Aortic valve area, by VTI measures 1.38 cm. Pulmonic Valve: The pulmonic valve was grossly normal. Pulmonic valve regurgitation is not visualized. No evidence of pulmonic stenosis. Aorta: The aortic root and ascending aorta are structurally normal, with no evidence of dilitation. Venous: The inferior vena cava is normal in size with greater than 50% respiratory variability, suggesting right atrial pressure of 3 mmHg. IAS/Shunts: No atrial level shunt detected by color flow Doppler.  LEFT VENTRICLE PLAX 2D LVIDd:         4.10 cm     Diastology LVIDs:         3.00 cm     LV e' medial:    6.53 cm/s LV PW:         0.80 cm     LV E/e' medial:  11.0 LV IVS:        0.80 cm     LV e' lateral:   5.66 cm/s LVOT diam:     1.80 cm     LV E/e' lateral: 12.7 LV SV:         51 LV SV Index:   33 LVOT Area:     2.54 cm  LV Volumes (  MOD) LV vol d, MOD A4C: 71.0 ml LV vol s, MOD A4C: 31.1 ml LV SV MOD A4C:     71.0 ml RIGHT VENTRICLE RV Basal diam:  3.50 cm RV S prime:     8.38 cm/s TAPSE (M-mode): 1.8 cm LEFT ATRIUM             Index       RIGHT ATRIUM           Index LA diam:        3.30 cm  2.13 cm/m  RA Area:     10.70 cm LA Vol (A2C):   64.3 ml 41.53 ml/m RA Volume:   25.10 ml  16.21 ml/m LA Vol (A4C):   38.7 ml 24.99 ml/m LA Biplane Vol: 54.9 ml 35.46 ml/m  AORTIC VALVE AV Area (Vmax):    1.44 cm AV Area (Vmean):   1.39 cm AV Area (VTI):     1.38 cm AV Vmax:           154.33 cm/s AV Vmean:          115.000 cm/s AV VTI:            0.373 m AV Peak Grad:      9.5 mmHg AV Mean Grad:      6.0 mmHg LVOT Vmax:         87.20 cm/s LVOT Vmean:        62.800 cm/s LVOT VTI:          0.202 m LVOT/AV VTI ratio: 0.54  AORTA Ao Root diam: 2.40 cm MITRAL VALVE               TRICUSPID VALVE MV Area (PHT): 3.36 cm    TR Peak grad:   16.3 mmHg MV Decel Time: 226 msec    TR Vmax:        202.00 cm/s MV E velocity: 71.60 cm/s MV A velocity: 75.80 cm/s  SHUNTS MV E/A ratio:  0.94        Systemic VTI:  0.20 m                            Systemic Diam: 1.80 cm Sunit Tolia DO Electronically signed by Rex Kras DO Signature Date/Time: 04/24/2021/2:09:13 PM    Final       LAB RESULTS: Basic Metabolic Panel: Recent Labs  Lab 05/18/21 0309 05/19/21 0301  NA 135 138  K 3.8 3.5  CL 107 106  CO2 19* 21*  GLUCOSE 100* 112*  BUN 22 19  CREATININE 1.02* 0.93  CALCIUM 8.4* 8.9   Liver Function Tests: Recent Labs  Lab 05/16/21 1218 05/17/21 0301  AST 23 30  ALT 12 15  ALKPHOS 57 64  BILITOT 0.7 0.7  PROT 6.7 6.0*  ALBUMIN 3.1* 2.8*   No results for input(s): LIPASE, AMYLASE in the last 168 hours. No results for input(s): AMMONIA in the last 168 hours. CBC: Recent Labs  Lab 05/17/21 0301 05/18/21 0309 05/19/21 0301  WBC 21.7* 18.1* 12.8*  NEUTROABS 18.4*  --   --   HGB 8.1* 9.2* 9.9*  HCT 25.6* 28.2* 30.3*  MCV 105.3* 101.4* 101.3*  PLT 166 183 219   Cardiac Enzymes: No results for input(s): CKTOTAL, CKMB, CKMBINDEX, TROPONINI in the last 168 hours. BNP: Invalid input(s): POCBNP CBG: No results for input(s): GLUCAP in the last 168 hours.     Disposition and  Follow-up: Discharge Instructions  Diet - low sodium heart healthy   Complete by: As directed    Discharge instructions   Complete by: As directed    Please hold Toprol-XL and Entresto.  Please check blood pressure daily and call Dr. Terri Skains for recommendations when to resume both medications.   Increase activity slowly   Complete by: As directed         DISPOSITION: Home   DISCHARGE FOLLOW-UP  Follow-up Information     Varadarajan, Rupashree. Schedule an appointment as soon as possible for a visit in 2 week(s).   Why: for hospital follow-up        Tolia, Sunit, DO. Call on 05/20/2021.   Specialties: Cardiology, Vascular Surgery Why: Regarding when to resume Toprol-XL and [employer] information: [place] [phone]                  Time coordinating discharge:  35 minutes  Signed:   Estill Cotta M.D. [facility] 05/19/2021, 11:58 AM

## 2021-05-19 NOTE — Plan of Care (Signed)
  Problem: Education: Goal: Knowledge of General Education information will improve Description: Including pain rating scale, medication(s)/side effects and non-pharmacologic comfort measures Outcome: Progressing   Problem: Health Behavior/Discharge Planning: Goal: Ability to manage health-related needs will improve Outcome: Progressing   Problem: Clinical Measurements: Goal: Ability to maintain clinical measurements within normal limits will improve Outcome: Progressing Goal: Will remain free from infection Outcome: Progressing   Problem: Activity: Goal: Risk for activity intolerance will decrease Outcome: Progressing   Problem: Nutrition: Goal: Adequate nutrition will be maintained Outcome: Progressing   Problem: Coping: Goal: Level of anxiety will decrease Outcome: Progressing   Problem: Elimination: Goal: Will not experience complications related to bowel motility Outcome: Progressing Goal: Will not experience complications related to urinary retention Outcome: Progressing   Problem: Safety: Goal: Ability to remain free from injury will improve Outcome: Progressing   Problem: Skin Integrity: Goal: Risk for impaired skin integrity will decrease Outcome: Progressing

## 2021-05-19 NOTE — Progress Notes (Signed)
Gave patient's daughter, Patricia Pesa, her discharge instructions. Daughter verbalized understanding of instructions. Took patient off of the monitor and IV was removed. Daughter got patient dressed and I took patient out of hospital on a wheelchair @ 1200 where daughter picked her up to take her home.

## 2021-05-19 NOTE — Evaluation (Signed)
Physical Therapy Evaluation Patient Details Name: Anita Martinez MRN: [id number] DOB: Jan 07, 1933 Today's Date: 05/19/2021   History of Present Illness  Pt admitted from home with AMS and hypotension and dx with sepsis and acute metabolic encephalopathy 2* UTI.  Pt with hx of CHF and cardiomyopathy.  Clinical Impression  Pt admitted as above and presenting with functional mobility limitations 2* generalized weakness, fatigue and mild ambulatory balance deficits.  Pt should progress to dc home with family assist.    Follow Up Recommendations No PT follow up    Equipment Recommendations  None recommended by PT    Recommendations for Other Services       Precautions / Restrictions Precautions Precautions: Fall Restrictions Weight Bearing Restrictions: No      Mobility  Bed Mobility Overal bed mobility: Modified Independent                  Transfers Overall transfer level: Needs assistance Equipment used: None Transfers: Sit to/from Stand Sit to Stand: Min guard;Supervision         General transfer comment: min steady assist only  Ambulation/Gait Ambulation/Gait assistance: Min guard Gait Distance (Feet): 400 Feet Assistive device: None Gait Pattern/deviations: Step-through pattern;Decreased step length - right;Decreased step length - left;Shuffle;Trunk flexed     General Gait Details: Mild intermittent instability but no over LOB  Stairs            Wheelchair Mobility    Modified Rankin (Stroke Patients Only)       Balance Overall balance assessment: Needs assistance Sitting-balance support: No upper extremity supported;Feet supported Sitting balance-Leahy Scale: Good     Standing balance support: No upper extremity supported Standing balance-Leahy Scale: Good                               Pertinent Vitals/Pain Pain Assessment: No/denies pain    Home Living Family/patient expects to be discharged to:: Private  residence Living Arrangements: Alone Available Help at Discharge: Available PRN/intermittently Type of Home: Mobile home Home Access: Stairs to enter Entrance Stairs-Rails: Right Entrance Stairs-Number of Steps: 4 Home Layout: One level Home Equipment: Walker - 4 wheels;Cane - single point;Shower seat;Hand held shower head Additional Comments: Dtr present states she is with mom every day for meds and whatever else she needs    Prior Function Level of Independence: Independent               Hand Dominance   Dominant Hand: Right    Extremity/Trunk Assessment   Upper Extremity Assessment Upper Extremity Assessment: Overall WFL for tasks assessed    Lower Extremity Assessment Lower Extremity Assessment: Overall WFL for tasks assessed       Communication   Communication: HOH  Cognition Arousal/Alertness: Awake/alert Behavior During Therapy: Flat affect Overall Cognitive Status: Within Functional Limits for tasks assessed                                        General Comments      Exercises     Assessment/Plan    PT Assessment Patient needs continued PT services  PT Problem List Decreased strength;Decreased activity tolerance;Decreased balance;Decreased mobility       PT Treatment Interventions DME instruction;Functional mobility training;Therapeutic activities;Patient/family education    PT Goals (Current goals can be found in the Care Plan section)  Acute Rehab  PT Goals Patient Stated Goal: HOME PT Goal Formulation: With patient/family Time For Goal Achievement: 05/26/21 Potential to Achieve Goals: Good    Frequency Min 1X/week   Barriers to discharge        Co-evaluation               AM-PAC PT "6 Clicks" Mobility  Outcome Measure Help needed turning from your back to your side while in a flat bed without using bedrails?: None Help needed moving from lying on your back to sitting on the side of a flat bed without using  bedrails?: None Help needed moving to and from a bed to a chair (including a wheelchair)?: None Help needed standing up from a chair using your arms (e.g., wheelchair or bedside chair)?: A Little Help needed to walk in hospital room?: A Little Help needed climbing 3-5 steps with a railing? : A Little 6 Click Score: 21    End of Session Equipment Utilized During Treatment: Gait belt Activity Tolerance: Patient tolerated treatment well Patient left: in chair;with call bell/phone within reach;with family/visitor present Nurse Communication: Mobility status PT Visit Diagnosis: Unsteadiness on feet (R26.81);Muscle weakness (generalized) (M62.81)    Time: 4287-6811 PT Time Calculation (min) (ACUTE ONLY): 16 min   Charges:   PT Evaluation $PT Eval Low Complexity: 1 Low          Mauro Kaufmann PT Acute Rehabilitation Services Pager [phone] Office [phone]   [name] 05/19/2021, 1:02 PM

## 2021-05-20 ENCOUNTER — Telehealth: Payer: Self-pay

## 2021-05-21 ENCOUNTER — Other Ambulatory Visit: Payer: Self-pay | Admitting: Cardiology

## 2021-05-21 DIAGNOSIS — I5022 Chronic systolic (congestive) heart failure: Secondary | ICD-10-CM

## 2021-05-21 DIAGNOSIS — I429 Cardiomyopathy, unspecified: Secondary | ICD-10-CM

## 2021-05-29 DIAGNOSIS — N390 Urinary tract infection, site not specified: Secondary | ICD-10-CM | POA: Diagnosis not present

## 2021-05-29 DIAGNOSIS — E46 Unspecified protein-calorie malnutrition: Secondary | ICD-10-CM | POA: Diagnosis not present

## 2021-06-11 ENCOUNTER — Encounter: Payer: Self-pay | Admitting: Cardiology

## 2021-06-11 ENCOUNTER — Other Ambulatory Visit: Payer: Self-pay

## 2021-06-11 ENCOUNTER — Ambulatory Visit: Payer: Medicare Other | Admitting: Cardiology

## 2021-06-11 VITALS — BP 132/73 | HR 63 | Temp 97.2°F | Resp 16 | Ht 65.0 in | Wt 110.6 lb

## 2021-06-11 DIAGNOSIS — E782 Mixed hyperlipidemia: Secondary | ICD-10-CM

## 2021-06-11 DIAGNOSIS — I429 Cardiomyopathy, unspecified: Secondary | ICD-10-CM

## 2021-06-11 DIAGNOSIS — G4700 Insomnia, unspecified: Secondary | ICD-10-CM | POA: Diagnosis not present

## 2021-06-11 DIAGNOSIS — Z79891 Long term (current) use of opiate analgesic: Secondary | ICD-10-CM | POA: Diagnosis not present

## 2021-06-11 DIAGNOSIS — Z87891 Personal history of nicotine dependence: Secondary | ICD-10-CM | POA: Diagnosis not present

## 2021-06-11 DIAGNOSIS — M961 Postlaminectomy syndrome, not elsewhere classified: Secondary | ICD-10-CM | POA: Diagnosis not present

## 2021-06-11 DIAGNOSIS — I5032 Chronic diastolic (congestive) heart failure: Secondary | ICD-10-CM | POA: Diagnosis not present

## 2021-06-11 DIAGNOSIS — G894 Chronic pain syndrome: Secondary | ICD-10-CM | POA: Diagnosis not present

## 2021-06-11 NOTE — Progress Notes (Signed)
Anita Martinez Date of Birth: [birth year] MRN: [id number] Primary Care Provider:Varadarajan, Rupashree  Date: 06/11/21 Last Office Visit: 03/12/2021  Chief Complaint  Patient presents with   heart failure with reduced ejection fraction   Results   Follow-up    HPI  Anita Martinez is a 87 y.o.  female who presents to the office with a chief complaint of " 10-month follow-up for congestive heart failure." Patient's past medical history and cardiovascular risk factors include: Newly diagnosed heart failure with reduced EF, cardiomyopathy, severe mitral regurgitation, moderate to severe TR, hyperlipidemia, chronic back pain, anxiety, advanced age, postmenopausal female.  Patient is accompanied by her daughter, Anita Martinez ([id number]), at today's office visit.    Back in May 2021 patient was noted to be in biventricular heart failure with acute decompensation and patient and family refused to undergo invasive cardiovascular testing and wanted to treat her medically.  They have been very compliant with medical therapy and regular office follow-up.  Her medications has been uptitrated in a stepwise fashion.  She recently had an echocardiogram in August 2022 which notes significant improvement in LVEF as noted below.  Unfortunately, she recently had a hospitalization for E. coli bacteremia causing acute kidney injury sepsis and acute metabolic encephalopathy.  Hospitalization records reviewed.  Since discharge patient's symptoms have improved significantly.  She is now back on Toprol-XL and Entresto. Most recent echocardiogram results note significant improvement in her LVEF.  Initially back in August 2021 LVEF was 20-25% and now is closer to 50-55%.  Patient is congratulated on her efforts with regards to medication compliance and regular follow-up visits.  ALLERGIES: Allergies  Allergen Reactions   Morphine And Related Other (See Comments)    Makes patient hyper and unable to sleep     MEDICATION LIST PRIOR TO VISIT: Current Outpatient Medications on File Prior to Visit  Medication Sig Dispense Refill   acetaminophen (TYLENOL) 500 MG tablet Take 500-1,000 mg by mouth 3 (three) times daily as needed (for pain).     atorvastatin (LIPITOR) 10 MG tablet Take 10 mg by mouth at bedtime.     feeding supplement, ENSURE ENLIVE, (ENSURE ENLIVE) LIQD Take 237 mLs by mouth 2 (two) times daily between meals. (Patient taking differently: Take 237 mLs by mouth 2 (two) times daily as needed (for nutritional supplementation).) 237 mL 12   fish oil-omega-3 fatty acids 1000 MG capsule Take 1 g by mouth daily with breakfast.     HYDROcodone-acetaminophen (NORCO/VICODIN) 5-325 MG tablet Take 1 tablet by mouth at bedtime.     metoprolol succinate (TOPROL-XL) 25 MG 24 hr tablet TAKE 1 TABLET BY MOUTH BY MOUTH EVERY EVERY MORNING 90 tablet 0   Multiple Vitamin (MULTIVITAMIN WITH MINERALS) TABS tablet Take 1 tablet by mouth daily. 30 tablet 1   sacubitril-valsartan (ENTRESTO) 49-51 MG Take 1 tablet by mouth 2 (two) times daily. HOLD until advised to resume by your cardiologist 180 tablet 1   No current facility-administered medications on file prior to visit.    PAST MEDICAL HISTORY: Past Medical History:  Diagnosis Date   Cardiomyopathy (HCC)    CHF (congestive heart failure) (HCC)    Chronic back pain    Colon polyps    Colonic diverticular abscess    MR (mitral regurgitation)    TR (tricuspid regurgitation)     PAST SURGICAL HISTORY: Past Surgical History:  Procedure Laterality Date   ABDOMINAL HYSTERECTOMY     BLADDER SUSPENSION      FAMILY HISTORY: The patient's  family history is not on file. No family history of premature coronary disease or sudden cardiac death.   SOCIAL HISTORY:  The patient  reports that she quit smoking about 52 years ago. Her smoking use included cigarettes. She started smoking about 66 years ago. She has a 14.00 pack-year smoking history. She has  never used smokeless tobacco. She reports that she does not drink alcohol and does not use drugs.  Review of Systems  Constitutional: Negative for chills, fever, malaise/fatigue and weight gain.  HENT:  Positive for hearing loss and tinnitus (chronic and stable.). Negative for hoarse voice and nosebleeds.   Eyes:  Negative for discharge, double vision and pain.  Cardiovascular:  Negative for chest pain, claudication, dyspnea on exertion, leg swelling, near-syncope, orthopnea, palpitations, paroxysmal nocturnal dyspnea and syncope.  Respiratory:  Negative for hemoptysis and shortness of breath.   Musculoskeletal:  Negative for muscle cramps and myalgias.  Gastrointestinal:  Negative for abdominal pain, constipation, diarrhea, hematemesis, hematochezia, melena, nausea and vomiting.  Neurological:  Negative for dizziness and light-headedness.   PHYSICAL EXAM: Vitals with BMI 06/11/2021 05/19/2021 05/19/2021  Height 5\' 5"  - -  Weight 110 lbs 10 oz - -  BMI 18.4 - -  Systolic 132 - 108  Diastolic 73 - 48  Pulse 63 86 56    CONSTITUTIONAL: Frail-appearing elderly female.  No acute distress, hemodynamically stable.    SKIN: Skin is warm and dry. No rash noted. No cyanosis. No pallor. No jaundice HEAD: Normocephalic and atraumatic.  EYES: No scleral icterus MOUTH/THROAT: Moist oral membranes.  NECK: No JVD present. No thyromegaly noted. No carotid bruits  LYMPHATIC: No visible cervical adenopathy.  CHEST Normal respiratory effort. No intercostal retractions  LUNGS: Clear to auscultation bilaterally.  No stridor. No wheezes. No rales.  CARDIOVASCULAR: Regular, positive S1-S2, holosystolic murmur heard at the apex, no gallops or rubs ABDOMINAL: No apparent ascites, abdominal hernia.  EXTREMITIES: No peripheral edema   HEMATOLOGIC: No significant bruising. NEUROLOGIC: Oriented to person, place, and time. Nonfocal. Normal muscle tone.  PSYCHIATRIC: Normal mood and affect. Normal behavior.  Cooperative  CARDIAC DATABASE: EKG: 12/04/2020: Normal sinus rhythm, 62 bpm, frequent PACs, consider old anteroseptal infarct, without underlying injury pattern.  Echocardiogram: 04/24/2021:  1. Left ventricular ejection fraction, by estimation, is 50 to 55%. The  left ventricle has low normal function. The left ventricle has no regional  wall motion abnormalities. Left ventricular diastolic parameters are  consistent with Grade I diastolic  dysfunction (impaired relaxation).   2. Right ventricular systolic function is low normal. The right  ventricular size is upper limit of normal. There is normal pulmonary  artery systolic pressure.   3. Left atrial size was mildly dilated.   4. The mitral valve is degenerative. Mild mitral valve regurgitation.   5. The aortic valve is tricuspid. Aortic valve regurgitation is not  visualized. Mild aortic valve sclerosis is present, with no evidence of  aortic valve stenosis.   6. The inferior vena cava is normal in size with greater than 50%  respiratory variability, suggesting right atrial pressure of 3 mmHg.   Comparison(s): Prior study 05/07/2020: LVEF 20-25%, severe global  hypokinesis, Grade 3 DD, elevated LAP, LAE severely dilated, Moderate MR,  RAP normal.   LABORATORY DATA: CBC Latest Ref Rng & Units 05/19/2021 05/18/2021 05/17/2021  WBC 4.0 - 10.5 K/uL 12.8(H) 18.1(H) 21.7(H)  Hemoglobin 12.0 - 15.0 g/dL 05/19/2021) 0.6(Y) 8.1(L)  Hematocrit 36.0 - 46.0 % 30.3(L) 28.2(L) 25.6(L)  Platelets 150 - 400  K/uL 219 183 166    CMP Latest Ref Rng & Units 05/19/2021 05/18/2021 05/17/2021  Glucose 70 - 99 mg/dL 696(E) 952(W) 413(K)  BUN 8 - 23 mg/dL 19 22 44(W)  Creatinine 0.44 - 1.00 mg/dL 1.02 7.25(D) 6.64(Q)  Sodium 135 - 145 mmol/L 138 135 136  Potassium 3.5 - 5.1 mmol/L 3.5 3.8 3.6  Chloride 98 - 111 mmol/L 106 107 110  CO2 22 - 32 mmol/L 21(L) 19(L) 21(L)  Calcium 8.9 - 10.3 mg/dL 8.9 0.3(K) 8.1(L)  Total Protein 6.5 - 8.1 g/dL - - 6.0(L)   Total Bilirubin 0.3 - 1.2 mg/dL - - 0.7  Alkaline Phos 38 - 126 U/L - - 64  AST 15 - 41 U/L - - 30  ALT 0 - 44 U/L - - 15    Lipid Panel  Lab Results  Component Value Date   CHOL 169 03/05/2021   HDL 43 03/05/2021   LDLCALC 91 03/05/2021   LDLDIRECT 78 03/05/2021   TRIG 204 (H) 03/05/2021    No results found for: HGBA1C No components found for: NTPROBNP Lab Results  Component Value Date   TSH 1.554 01/30/2020    Cardiac Panel (last 3 results) No results for input(s): CKTOTAL, CKMB, TROPONINIHS, RELINDX in the last 72 hours.  IMPRESSION:    ICD-10-CM   1. Chronic heart failure with preserved ejection fraction (HFpEF) (HCC)  I50.32 Pro b natriuretic peptide (BNP)    Basic metabolic panel    2. Cardiomyopathy, recovered  I42.9     3. Mixed hyperlipidemia  E78.2     4. Former smoker  Z87.891        RECOMMENDATIONS: JARIANA SHUMARD is a 87 y.o. female whose past medical history and cardiovascular risk factors include: biventricular heart failure, stage C, NYHA class II, cardiomyopathy, mixed hyperlipidemia, former smoker, advanced age, postmenopausal female.  Chronic HFpEF, stage C, NYHA class II: History of biventricular heart failure noted during her hospitalization May 2021 (LVEF 20-25% and grade 3 DD). Since up titration of GDMT LVEF has improved as per the most recent echocardiogram 04/24/2021-LVEF 50-55%.   No recurrent hospitalizations for congestive heart failure Medications reconciled. In the past patient is daughter has been reluctant to [employer] and or add Aldactone as she is remained relatively stable.  And since the LVEF has improved would like to continue the current medical therapy. Patient's daughter is informed to call the office if she starts to gain weight or has symptoms of shortness of breath at rest or with effort related activities. Recommend daily weight check, strict I/O's Fluid restriction to <2L per day, Na restriction < 2g per  day Will check labs prior to next office visit.   Mixed hyperlipidemia:  Continue statin therapy.   Most recent lipid profile reviewed patient does not endorse myalgias.   Hypertriglyceridemia: Patient would like to continue with lifestyle changes as opposed to addition of pharmacological therapy.  Former smoker: Educated on importance of continued smoking cessation.  FINAL MEDICATION LIST END OF ENCOUNTER: Medications Discontinued During This Encounter  Medication Reason   benzonatate (TESSALON) 100 MG capsule Error   guaiFENesin (MUCINEX) 600 MG 12 hr tablet Error    Current Outpatient Medications:    acetaminophen (TYLENOL) 500 MG tablet, Take 500-1,000 mg by mouth 3 (three) times daily as needed (for pain)., Disp: , Rfl:    atorvastatin (LIPITOR) 10 MG tablet, Take 10 mg by mouth at bedtime., Disp: , Rfl:    feeding supplement, ENSURE ENLIVE, (ENSURE  ENLIVE) LIQD, Take 237 mLs by mouth 2 (two) times daily between meals. (Patient taking differently: Take 237 mLs by mouth 2 (two) times daily as needed (for nutritional supplementation).), Disp: 237 mL, Rfl: 12   fish oil-omega-3 fatty acids 1000 MG capsule, Take 1 g by mouth daily with breakfast., Disp: , Rfl:    HYDROcodone-acetaminophen (NORCO/VICODIN) 5-325 MG tablet, Take 1 tablet by mouth at bedtime., Disp: , Rfl:    metoprolol succinate (TOPROL-XL) 25 MG 24 hr tablet, TAKE 1 TABLET BY MOUTH BY MOUTH EVERY EVERY MORNING, Disp: 90 tablet, Rfl: 0   Multiple Vitamin (MULTIVITAMIN WITH MINERALS) TABS tablet, Take 1 tablet by mouth daily., Disp: 30 tablet, Rfl: 1   sacubitril-valsartan (ENTRESTO) 49-51 MG, Take 1 tablet by mouth 2 (two) times daily. HOLD until advised to resume by your cardiologist, Disp: 180 tablet, Rfl: 1  Orders Placed This Encounter  Procedures   Pro b natriuretic peptide (BNP)   Basic metabolic panel   --Continue cardiac medications as reconciled in final medication list. --Return in about 6 months (around  12/09/2021) for Follow up, heart failure management.. Or sooner if needed. --Continue follow-up with your primary care physician regarding the management of your other chronic comorbid conditions.  Patient's questions and concerns were addressed to her satisfaction. She voices understanding of the instructions provided during this encounter.   This note was created using a voice recognition software as a result there may be grammatical errors inadvertently enclosed that do not reflect the nature of this encounter. Every attempt is made to correct such errors.  Tessa Lerner, [place], [facility]  Pager: [phone] Office: [phone]

## 2021-06-13 ENCOUNTER — Ambulatory Visit: Payer: Medicare Other | Admitting: Cardiology

## 2021-06-14 ENCOUNTER — Other Ambulatory Visit: Payer: Self-pay

## 2021-06-14 DIAGNOSIS — I5032 Chronic diastolic (congestive) heart failure: Secondary | ICD-10-CM

## 2021-06-18 ENCOUNTER — Ambulatory Visit: Payer: Medicare Other | Admitting: Cardiology

## 2021-06-23 ENCOUNTER — Other Ambulatory Visit: Payer: Self-pay | Admitting: Cardiology

## 2021-06-24 ENCOUNTER — Other Ambulatory Visit: Payer: Self-pay

## 2021-06-24 MED ORDER — ENTRESTO 49-51 MG PO TABS
1.0000 | ORAL_TABLET | Freq: Two times a day (BID) | ORAL | 1 refills | Status: DC
Start: 2021-06-24 — End: 2021-06-24

## 2021-06-24 MED ORDER — ENTRESTO 49-51 MG PO TABS: 1.0000 | ORAL_TABLET | Freq: Two times a day (BID) | ORAL | 1 refills | Status: DC

## 2021-07-12 DIAGNOSIS — G43009 Migraine without aura, not intractable, without status migrainosus: Secondary | ICD-10-CM | POA: Diagnosis not present

## 2021-07-12 DIAGNOSIS — I5020 Unspecified systolic (congestive) heart failure: Secondary | ICD-10-CM | POA: Diagnosis not present

## 2021-07-12 DIAGNOSIS — G8929 Other chronic pain: Secondary | ICD-10-CM | POA: Diagnosis not present

## 2021-07-12 DIAGNOSIS — E785 Hyperlipidemia, unspecified: Secondary | ICD-10-CM | POA: Diagnosis not present

## 2021-08-18 ENCOUNTER — Other Ambulatory Visit: Payer: Self-pay | Admitting: Cardiology

## 2021-08-18 DIAGNOSIS — I5022 Chronic systolic (congestive) heart failure: Secondary | ICD-10-CM

## 2021-08-18 DIAGNOSIS — I429 Cardiomyopathy, unspecified: Secondary | ICD-10-CM

## 2021-08-29 IMAGING — CT CT HEAD W/O CM
3 series · 15 of 45 positions shown, 18 images · non-contrast
Comparison: 05/25/2006

CLINICAL DATA: Fatigue, dizziness, weakness

EXAM:
CT HEAD WITHOUT CONTRAST
TECHNIQUE: Contiguous axial images were obtained from the base of the skull
through the vertex without intravenous contrast.

[Series 2: head wo · axial · 0.43mm/px · z∈[-153,-38]mm · 9 of 28 slices shown, 12 images]
[im 3/28  brain]
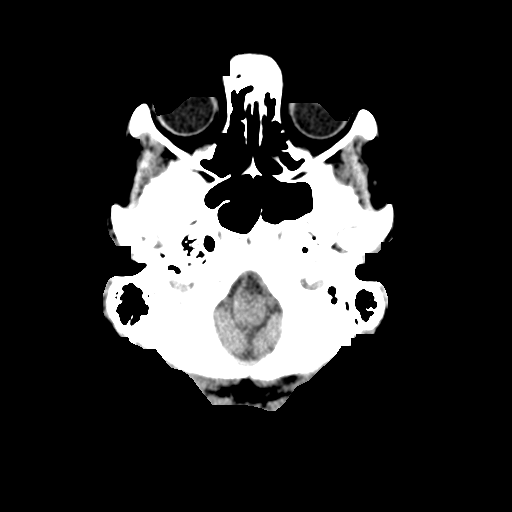
[im 3/28  bone]
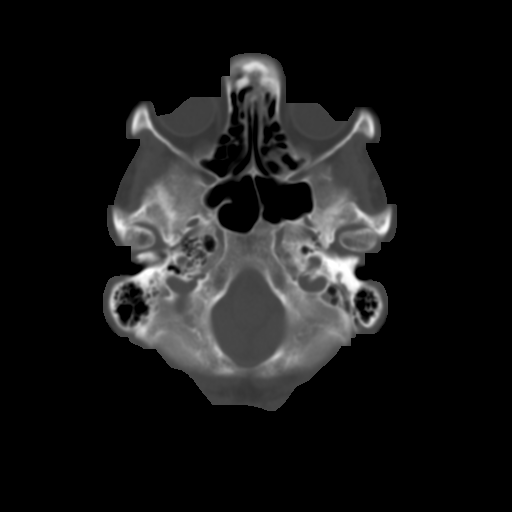
[im 6/28  brain]
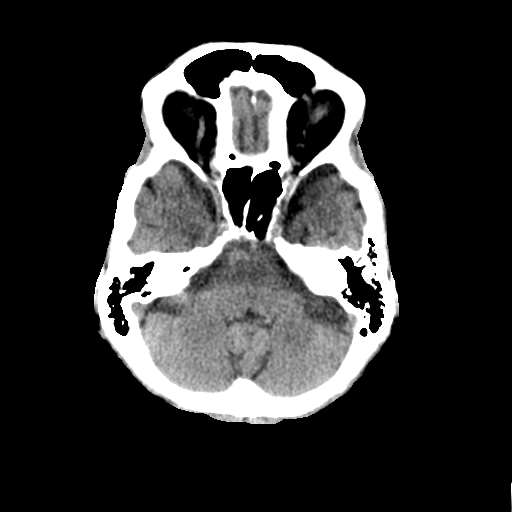
[im 9/28  brain]
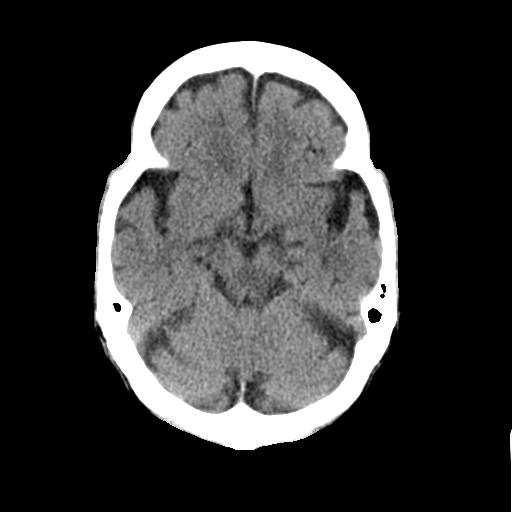
[im 12/28  brain]
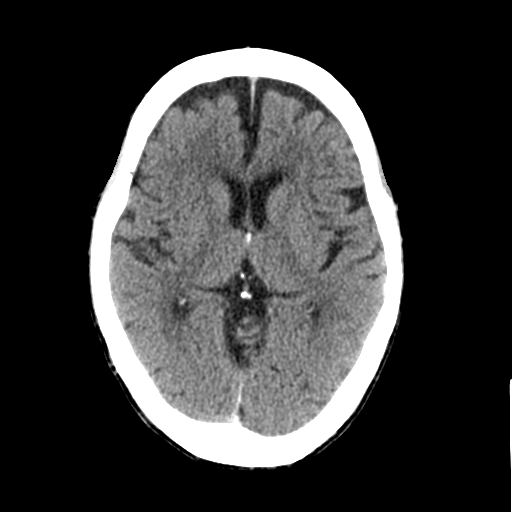
[im 15/28  brain]
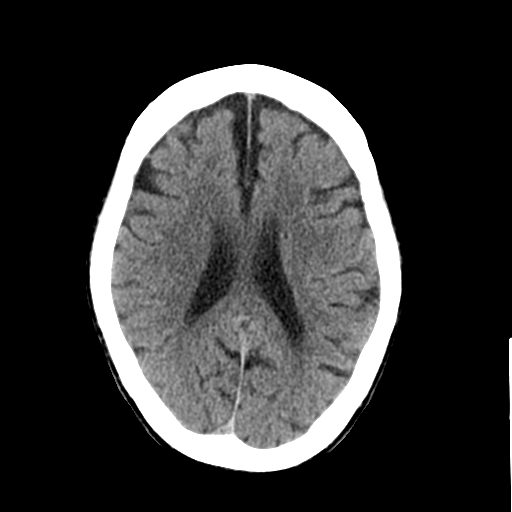
[im 15/28  bone]
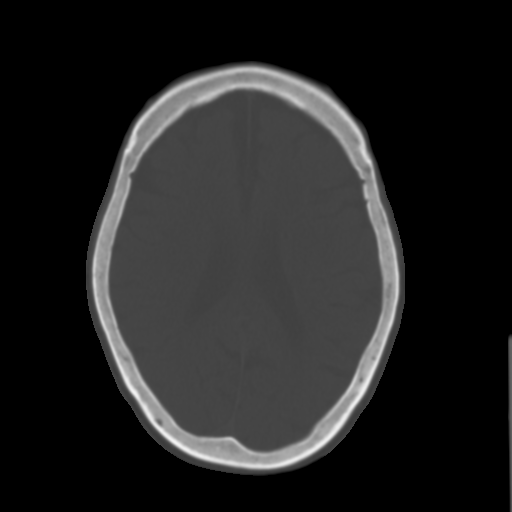
[im 17/28  brain]
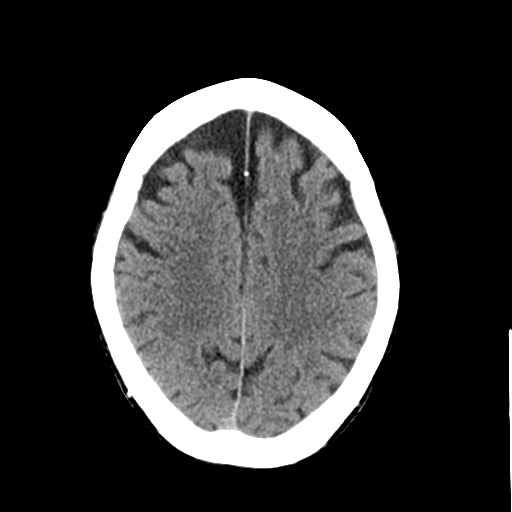
[im 20/28  brain]
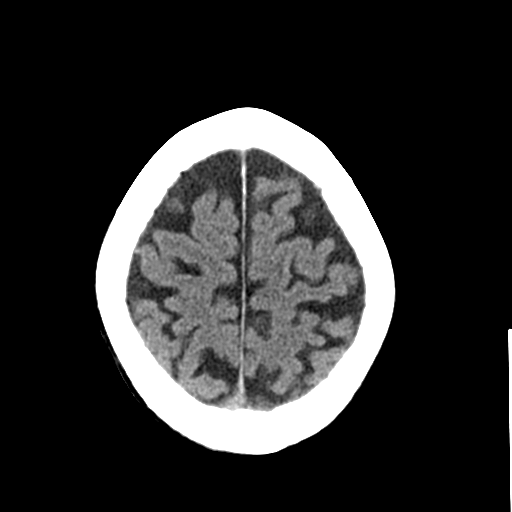
[im 23/28  brain]
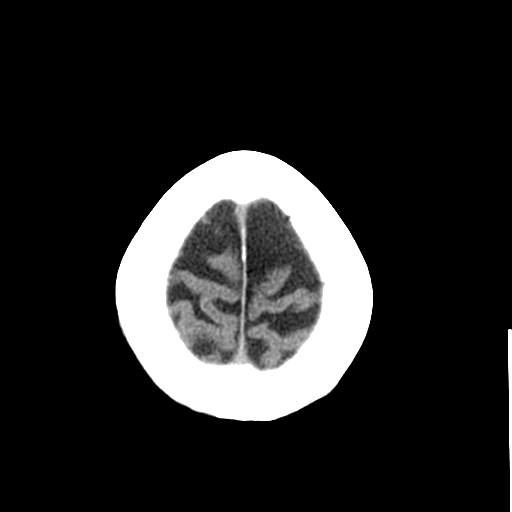
[im 26/28  brain]
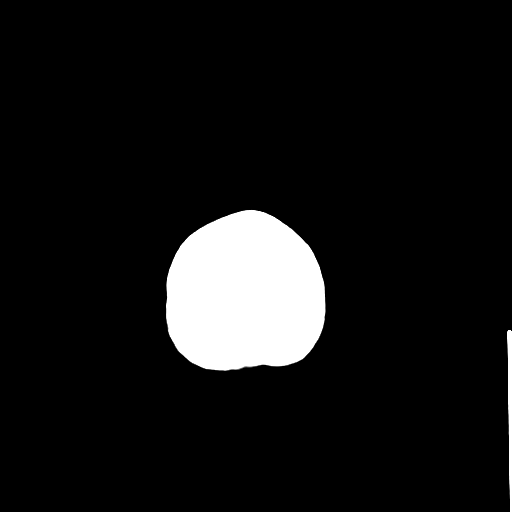
[im 26/28  bone]
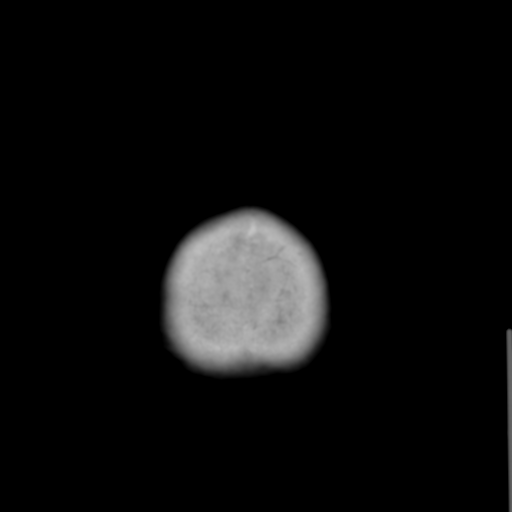

[Series 5: coronal soft tissue · coronal · 0.27mm/px · 3 of 78 slices shown]
[im 26/78  brain]
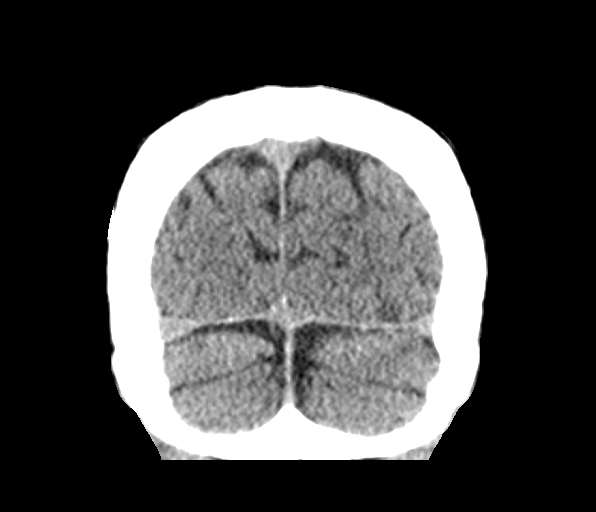
[im 35/78  brain]
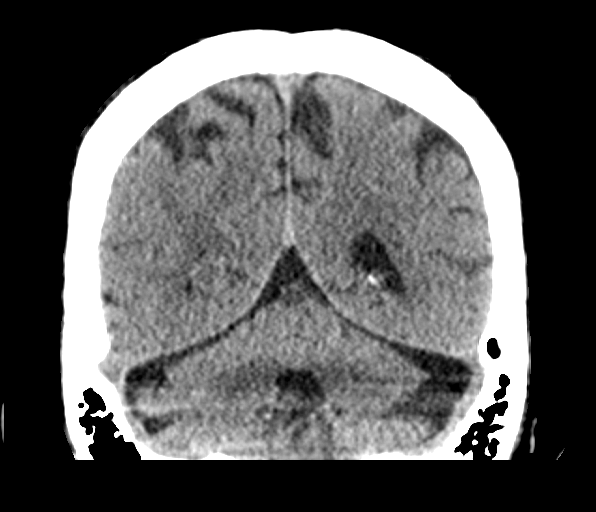
[im 43/78  brain]
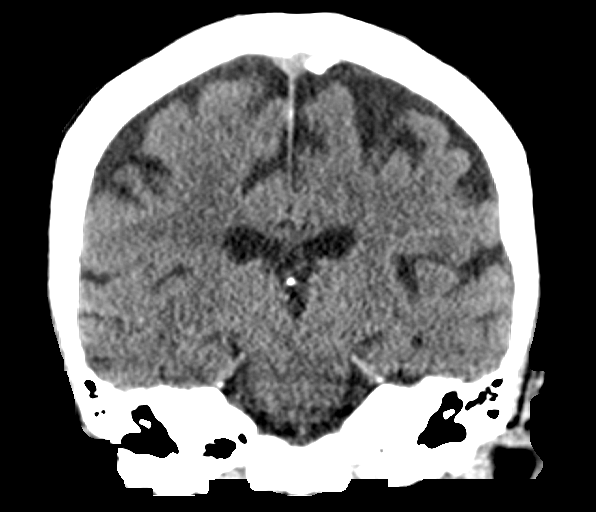

[Series 6: sagittal soft tissue · sagittal · 0.27mm/px · 3 of 56 slices shown]
[im 19/56  brain]
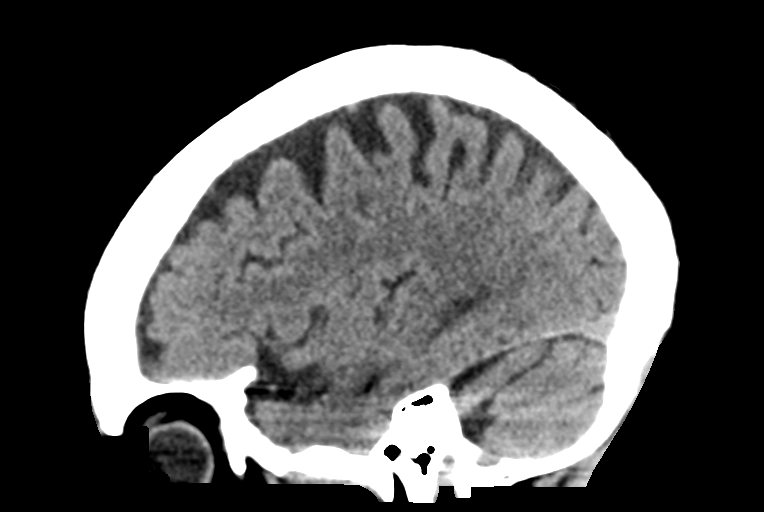
[im 28/56  brain]
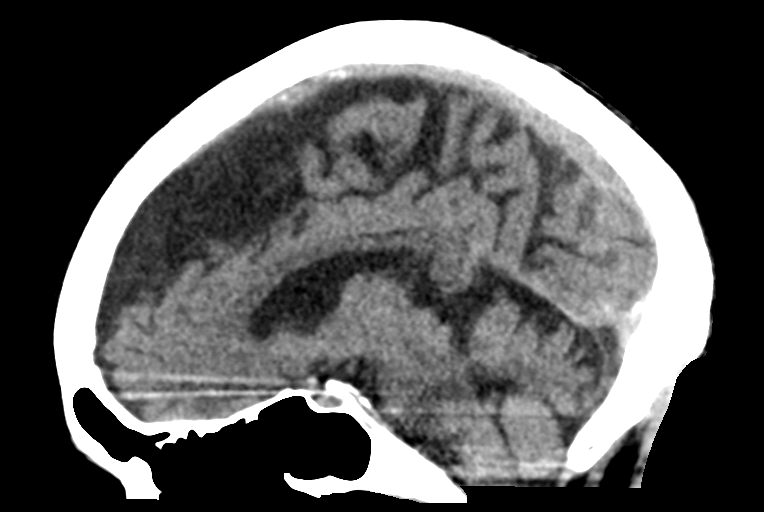
[im 37/56  brain]
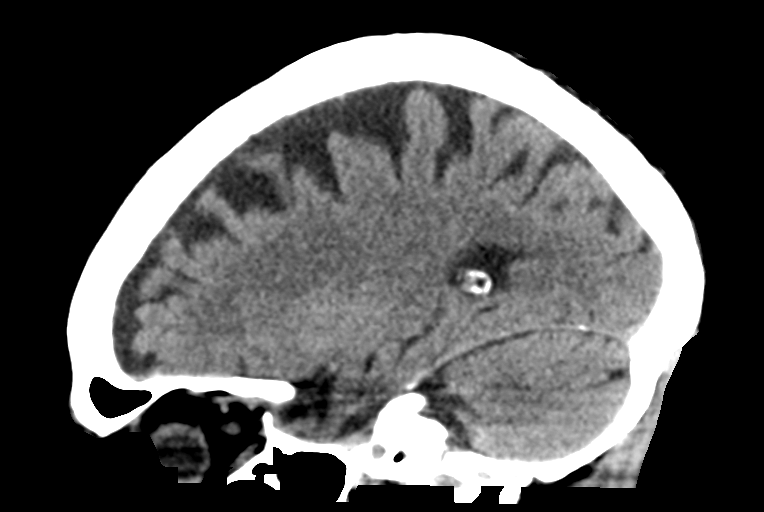

[15 of 45 positions shown; findings below may reference images not displayed]

FINDINGS: Brain: No evidence of acute infarction, hemorrhage, hydrocephalus,
extra-axial collection or mass lesion/mass effect. Scattered
low-density changes within the periventricular and subcortical white
matter compatible with chronic microvascular ischemic change. Mild
diffuse cerebral volume loss.

Vascular: Mild atherosclerotic calcifications involving the large
vessels of the skull base. No unexpected hyperdense vessel.

Skull: Normal. Negative for fracture or focal lesion.

Sinuses/Orbits: No acute finding.

Other: None.
IMPRESSION: 1.  No acute intracranial findings.

2.  Chronic microvascular ischemic change and cerebral volume loss.

## 2021-09-02 DIAGNOSIS — Z79891 Long term (current) use of opiate analgesic: Secondary | ICD-10-CM | POA: Diagnosis not present

## 2021-09-02 DIAGNOSIS — G4700 Insomnia, unspecified: Secondary | ICD-10-CM | POA: Diagnosis not present

## 2021-09-02 DIAGNOSIS — M961 Postlaminectomy syndrome, not elsewhere classified: Secondary | ICD-10-CM | POA: Diagnosis not present

## 2021-09-02 DIAGNOSIS — G894 Chronic pain syndrome: Secondary | ICD-10-CM | POA: Diagnosis not present

## 2021-10-15 DIAGNOSIS — G43009 Migraine without aura, not intractable, without status migrainosus: Secondary | ICD-10-CM | POA: Diagnosis not present

## 2021-10-15 DIAGNOSIS — G8929 Other chronic pain: Secondary | ICD-10-CM | POA: Diagnosis not present

## 2021-10-15 DIAGNOSIS — E785 Hyperlipidemia, unspecified: Secondary | ICD-10-CM | POA: Diagnosis not present

## 2021-10-15 DIAGNOSIS — I5020 Unspecified systolic (congestive) heart failure: Secondary | ICD-10-CM | POA: Diagnosis not present

## 2021-11-13 ENCOUNTER — Other Ambulatory Visit: Payer: Self-pay | Admitting: Cardiology

## 2021-11-13 DIAGNOSIS — I5022 Chronic systolic (congestive) heart failure: Secondary | ICD-10-CM

## 2021-11-13 DIAGNOSIS — I429 Cardiomyopathy, unspecified: Secondary | ICD-10-CM

## 2021-11-25 DIAGNOSIS — G4700 Insomnia, unspecified: Secondary | ICD-10-CM | POA: Diagnosis not present

## 2021-11-25 DIAGNOSIS — Z79891 Long term (current) use of opiate analgesic: Secondary | ICD-10-CM | POA: Diagnosis not present

## 2021-11-25 DIAGNOSIS — M961 Postlaminectomy syndrome, not elsewhere classified: Secondary | ICD-10-CM | POA: Diagnosis not present

## 2021-11-25 DIAGNOSIS — G894 Chronic pain syndrome: Secondary | ICD-10-CM | POA: Diagnosis not present

## 2021-12-02 DIAGNOSIS — I5032 Chronic diastolic (congestive) heart failure: Secondary | ICD-10-CM | POA: Diagnosis not present

## 2021-12-03 LAB — BASIC METABOLIC PANEL
BUN/Creatinine Ratio: 21 (ref 12–28)
BUN: 23 mg/dL (ref 8–27)
CO2: 23 mmol/L (ref 20–29)
Calcium: 10.3 mg/dL (ref 8.7–10.3)
Chloride: 103 mmol/L (ref 96–106)
Creatinine, Ser: 1.09 mg/dL — ABNORMAL HIGH (ref 0.57–1.00)
Glucose: 100 mg/dL — ABNORMAL HIGH (ref 70–99)
Potassium: 4.8 mmol/L (ref 3.5–5.2)
Sodium: 141 mmol/L (ref 134–144)
eGFR: 49 mL/min/{1.73_m2} — ABNORMAL LOW (ref >59)

## 2021-12-03 LAB — PRO B NATRIURETIC PEPTIDE: NT-Pro BNP: 176 pg/mL (ref 0–738)

## 2021-12-09 ENCOUNTER — Encounter: Payer: Self-pay | Admitting: Cardiology

## 2021-12-09 ENCOUNTER — Ambulatory Visit: Payer: Medicare Other | Admitting: Cardiology

## 2021-12-09 ENCOUNTER — Other Ambulatory Visit: Payer: Self-pay

## 2021-12-09 VITALS — BP 124/71 | HR 61 | Temp 98.0°F | Resp 17 | Ht 65.0 in | Wt 131.0 lb

## 2021-12-09 DIAGNOSIS — I5032 Chronic diastolic (congestive) heart failure: Secondary | ICD-10-CM | POA: Diagnosis not present

## 2021-12-09 DIAGNOSIS — Z87891 Personal history of nicotine dependence: Secondary | ICD-10-CM | POA: Diagnosis not present

## 2021-12-09 DIAGNOSIS — I429 Cardiomyopathy, unspecified: Secondary | ICD-10-CM | POA: Diagnosis not present

## 2021-12-09 DIAGNOSIS — E782 Mixed hyperlipidemia: Secondary | ICD-10-CM

## 2021-12-09 NOTE — Progress Notes (Signed)
? ?Anita Martinez ?Date of Birth: 1933/02/08 ?MRN: [id number] ?Primary Care Provider:Varadarajan, Rupashree ? ?Date: 12/09/21 ?Last Office Visit: June 19, 2021 ? ?Chief Complaint  ?Patient presents with  ? Follow-up  ?  6 month  ? Congestive Heart Failure  ? ? ?HPI  ?Anita Martinez is a 88 y.o.  female whose past medical history and cardiovascular risk factors include: Chronic HFpEF stage C, NYHA class II, recovered cardiomyopathy, mixed hyperlipidemia, former smoker, advanced age, postmenopausal female. ? ?Patient is accompanied by her daughter, Jackelyn Poling ([phone]), at today's office visit.   ? ?In May 2021 patient had biventricular heart failure with acute decompensation and given her advanced age and comorbid conditions and frailty family refused invasive cardiovascular testing and wanted to treat her medically.  As outpatient she followed up regularly and GDMT was uptitrated and repeat echocardiogram noted improvement in LVEF.  She still being treated for chronic HFpEF.  She presents today for 19-month follow-up visit. ? ?She recently had labs in March 2023 which were independently reviewed and noted below for further reference.  Renal function and NT proBNP within normal limits.  Clinically patient remains stable from a cardiovascular standpoint.  She denies orthopnea, paroxysmal nocturnal dyspnea or lower extremity swelling.  Her weight has gone up over the holidays likely due to increased caloric intake. ? ?ALLERGIES: ?Allergies  ?Allergen Reactions  ? Morphine And Related Other (See Comments)  ?  Makes patient hyper and unable to sleep  ? ? ?MEDICATION LIST PRIOR TO VISIT: ?Current Outpatient Medications on File Prior to Visit  ?Medication Sig Dispense Refill  ? acetaminophen (TYLENOL) 500 MG tablet Take 500-1,000 mg by mouth 3 (three) times daily as needed (for pain).    ? atorvastatin (LIPITOR) 10 MG tablet Take 10 mg by mouth at bedtime.    ? feeding supplement, ENSURE ENLIVE, (ENSURE ENLIVE) LIQD  Take 237 mLs by mouth 2 (two) times daily between meals. (Patient taking differently: Take 237 mLs by mouth 2 (two) times daily as needed (for nutritional supplementation).) 237 mL 12  ? fish oil-omega-3 fatty acids 1000 MG capsule Take 1 g by mouth daily with breakfast.    ? HYDROcodone-acetaminophen (NORCO/VICODIN) 5-325 MG tablet Take 1 tablet by mouth at bedtime.    ? metoprolol succinate (TOPROL-XL) 25 MG 24 hr tablet TAKE 1 TABLET BY MOUTH EVERY MORNING 90 tablet 0  ? Multiple Vitamin (MULTIVITAMIN WITH MINERALS) TABS tablet Take 1 tablet by mouth daily. 30 tablet 1  ? sacubitril-valsartan (ENTRESTO) 49-51 MG Take 1 tablet by mouth 2 (two) times daily. HOLD until advised to resume by your cardiologist 180 tablet 1  ? ?No current facility-administered medications on file prior to visit.  ? ? ?PAST MEDICAL HISTORY: ?Past Medical History:  ?Diagnosis Date  ? Cardiomyopathy ([place])   ? CHF (congestive heart failure) ([place])   ? Chronic back pain   ? Colon polyps   ? Colonic diverticular abscess   ? MR (mitral regurgitation)   ? TR (tricuspid regurgitation)   ? ? ?PAST SURGICAL HISTORY: ?Past Surgical History:  ?Procedure Laterality Date  ? ABDOMINAL HYSTERECTOMY    ? BLADDER SUSPENSION    ? ? ?FAMILY HISTORY: ?No family history of premature coronary disease or sudden cardiac death. ?  ?SOCIAL HISTORY:  ?The patient  reports that she quit smoking about 53 years ago. Her smoking use included cigarettes. She started smoking about 67 years ago. She has a 14.00 pack-year smoking history. She has never used smokeless tobacco. She reports  that she does not drink alcohol and does not use drugs. ? ?Review of Systems  ?Constitutional: Negative for chills, fever, malaise/fatigue and weight gain.  ?HENT:  Positive for hearing loss and tinnitus (chronic and stable.). Negative for hoarse voice and nosebleeds.   ?Eyes:  Negative for discharge, double vision and pain.  ?Cardiovascular:  Negative for chest pain, claudication, dyspnea  on exertion, leg swelling, near-syncope, orthopnea, palpitations, paroxysmal nocturnal dyspnea and syncope.  ?Respiratory:  Negative for hemoptysis and shortness of breath.   ?Musculoskeletal:  Negative for muscle cramps and myalgias.  ?Gastrointestinal:  Negative for abdominal pain, constipation, diarrhea, hematemesis, hematochezia, melena, nausea and vomiting.  ?Neurological:  Negative for dizziness and light-headedness.  ? ?PHYSICAL EXAM: ?Vitals with BMI 12/09/2021 06/11/2021 05/19/2021  ?Height 5\' 5"  5\' 5"  -  ?Weight 131 lbs 110 lbs 10 oz -  ?BMI 21.8 18.4 -  ?Systolic [id number] -  ?Diastolic 71 73 -  ?Pulse 61 63 86  ? ? ?CONSTITUTIONAL: Frail-appearing elderly female.  No acute distress, hemodynamically stable.    ?SKIN: Skin is warm and dry. No rash noted. No cyanosis. No pallor. No jaundice ?HEAD: Normocephalic and atraumatic.  ?EYES: No scleral icterus ?MOUTH/THROAT: Moist oral membranes.  ?NECK: No JVD present. No thyromegaly noted. No carotid bruits  ?LYMPHATIC: No visible cervical adenopathy.  ?CHEST Normal respiratory effort. No intercostal retractions  ?LUNGS: Clear to auscultation bilaterally.  No stridor. No wheezes. No rales.  ?CARDIOVASCULAR: Regular, positive Q000111Q, holosystolic murmur heard at the apex, no gallops or rubs ?ABDOMINAL: No apparent ascites, abdominal hernia.  ?EXTREMITIES: No peripheral edema, warm to touch, varicose veins present bilaterally. ?HEMATOLOGIC: No significant bruising. ?NEUROLOGIC: Oriented to person, place, and time. Nonfocal. Normal muscle tone.  ?PSYCHIATRIC: Normal mood and affect. Normal behavior. Cooperative ? ?CARDIAC DATABASE: ?EKG: ?12/09/2021: NSR, 60 bpm, old anteroseptal infarct, without underlying injury pattern. ? ?Echocardiogram: ?04/24/2021: ? 1. Left ventricular ejection fraction, by estimation, is 50 to 55%. The  ?left ventricle has low normal function. The left ventricle has no regional  ?wall motion abnormalities. Left ventricular diastolic parameters are   ?consistent with Grade I diastolic  ?dysfunction (impaired relaxation).  ? 2. Right ventricular systolic function is low normal. The right  ?ventricular size is upper limit of normal. There is normal pulmonary  ?artery systolic pressure.  ? 3. Left atrial size was mildly dilated.  ? 4. The mitral valve is degenerative. Mild mitral valve regurgitation.  ? 5. The aortic valve is tricuspid. Aortic valve regurgitation is not  ?visualized. Mild aortic valve sclerosis is present, with no evidence of  ?aortic valve stenosis.  ? 6. The inferior vena cava is normal in size with greater than 50%  ?respiratory variability, suggesting right atrial pressure of 3 mmHg.  ? ?Comparison(s): Prior study 05/07/2020: LVEF 20-25%, severe global  ?hypokinesis, Grade 3 DD, elevated LAP, LAE severely dilated, Moderate MR,  ?RAP normal.  ? ?LABORATORY DATA: ?CBC Latest Ref Rng & Units 05/19/2021 05/18/2021 05/17/2021  ?WBC 4.0 - 10.5 K/uL 12.8(H) 18.1(H) 21.7(H)  ?Hemoglobin 12.0 - 15.0 g/dL 9.9(L) 9.2(L) 8.1(L)  ?Hematocrit 36.0 - 46.0 % 30.3(L) 28.2(L) 25.6(L)  ?Platelets 150 - 400 K/uL 219 183 166  ? ? ?CMP Latest Ref Rng & Units 12/02/2021 05/19/2021 05/18/2021  ?Glucose 70 - 99 mg/dL 100(H) 112(H) 100(H)  ?BUN 8 - 27 mg/dL 23 19 22   ?Creatinine 0.57 - 1.00 mg/dL 1.09(H) 0.93 1.02(H)  ?Sodium 134 - 144 mmol/L 141 138 135  ?Potassium 3.5 - 5.2 mmol/L 4.8  3.5 3.8  ?Chloride 96 - 106 mmol/L 103 106 107  ?CO2 20 - 29 mmol/L 23 21(L) 19(L)  ?Calcium 8.7 - 10.3 mg/dL 10.3 8.9 8.4(L)  ?Total Protein 6.5 - 8.1 g/dL - - -  ?Total Bilirubin 0.3 - 1.2 mg/dL - - -  ?Alkaline Phos 38 - 126 U/L - - -  ?AST 15 - 41 U/L - - -  ?ALT 0 - 44 U/L - - -  ? ? ?Lipid Panel  ?Lab Results  ?Component Value Date  ? CHOL 169 03/05/2021  ? HDL 43 03/05/2021  ? [place] 91 03/05/2021  ? LDLDIRECT 78 03/05/2021  ? TRIG 204 (H) 03/05/2021  ? ? ?No results found for: HGBA1C ?No components found for: NTPROBNP ?Lab Results  ?Component Value Date  ? TSH 1.554 01/30/2020   ? ? ?Cardiac Panel (last 3 results) ?No results for input(s): CKTOTAL, CKMB, TROPONINIHS, RELINDX in the last 72 hours. ? ?IMPRESSION: ? ?  ICD-10-CM   ?1. Chronic heart failure with preserved ejection fracti

## 2021-12-23 ENCOUNTER — Other Ambulatory Visit: Payer: Self-pay

## 2021-12-23 DIAGNOSIS — I5032 Chronic diastolic (congestive) heart failure: Secondary | ICD-10-CM

## 2021-12-31 ENCOUNTER — Other Ambulatory Visit: Payer: Self-pay | Admitting: Cardiology

## 2022-02-10 ENCOUNTER — Other Ambulatory Visit: Payer: Self-pay | Admitting: Cardiology

## 2022-02-10 DIAGNOSIS — I429 Cardiomyopathy, unspecified: Secondary | ICD-10-CM

## 2022-02-10 DIAGNOSIS — I5022 Chronic systolic (congestive) heart failure: Secondary | ICD-10-CM

## 2022-02-14 ENCOUNTER — Encounter (HOSPITAL_COMMUNITY): Payer: Self-pay

## 2022-02-14 ENCOUNTER — Emergency Department (HOSPITAL_COMMUNITY)
Admission: EM | Admit: 2022-02-14 | Discharge: 2022-02-14 | Disposition: A | Discharge status: Home / Self Care | Payer: Medicare Other | Attending: Emergency Medicine | Admitting: Emergency Medicine

## 2022-02-14 ENCOUNTER — Other Ambulatory Visit: Payer: Self-pay

## 2022-02-14 DIAGNOSIS — M546 Pain in thoracic spine: Secondary | ICD-10-CM | POA: Insufficient documentation

## 2022-02-14 DIAGNOSIS — I509 Heart failure, unspecified: Secondary | ICD-10-CM | POA: Insufficient documentation

## 2022-02-14 DIAGNOSIS — R5381 Other malaise: Secondary | ICD-10-CM | POA: Diagnosis not present

## 2022-02-14 DIAGNOSIS — R519 Headache, unspecified: Secondary | ICD-10-CM | POA: Insufficient documentation

## 2022-02-14 DIAGNOSIS — Z20822 Contact with and (suspected) exposure to covid-19: Secondary | ICD-10-CM | POA: Insufficient documentation

## 2022-02-14 DIAGNOSIS — K469 Unspecified abdominal hernia without obstruction or gangrene: Secondary | ICD-10-CM | POA: Insufficient documentation

## 2022-02-14 DIAGNOSIS — M40209 Unspecified kyphosis, site unspecified: Secondary | ICD-10-CM | POA: Insufficient documentation

## 2022-02-14 DIAGNOSIS — R531 Weakness: Secondary | ICD-10-CM | POA: Insufficient documentation

## 2022-02-14 DIAGNOSIS — R9431 Abnormal electrocardiogram [ECG] [EKG]: Secondary | ICD-10-CM | POA: Diagnosis not present

## 2022-02-14 LAB — CBC WITH DIFFERENTIAL/PLATELET
Abs Immature Granulocytes: 0.04 10*3/uL (ref 0.00–0.07)
Basophils Absolute: 0.1 10*3/uL (ref 0.0–0.1)
Basophils Relative: 1 %
Eosinophils Absolute: 0.1 10*3/uL (ref 0.0–0.5)
Eosinophils Relative: 1 %
HCT: 38.3 % (ref 36.0–46.0)
Hemoglobin: 12.4 g/dL (ref 12.0–15.0)
Immature Granulocytes: 0 %
Lymphocytes Relative: 25 %
Lymphs Abs: 2.4 10*3/uL (ref 0.7–4.0)
MCH: 32.8 pg (ref 26.0–34.0)
MCHC: 32.4 g/dL (ref 30.0–36.0)
MCV: 101.3 fL — ABNORMAL HIGH (ref 80.0–100.0)
Monocytes Absolute: 0.7 10*3/uL (ref 0.1–1.0)
Monocytes Relative: 7 %
Neutro Abs: 6.5 10*3/uL (ref 1.7–7.7)
Neutrophils Relative %: 66 %
Platelets: 173 10*3/uL (ref 150–400)
RBC: 3.78 MIL/uL — ABNORMAL LOW (ref 3.87–5.11)
RDW: 12.9 % (ref 11.5–15.5)
WBC: 9.7 10*3/uL (ref 4.0–10.5)
nRBC: 0 % (ref 0.0–0.2)

## 2022-02-14 LAB — URINALYSIS, ROUTINE W REFLEX MICROSCOPIC
Bilirubin Urine: NEGATIVE
Glucose, UA: NEGATIVE mg/dL
Ketones, ur: NEGATIVE mg/dL
Leukocytes,Ua: NEGATIVE
Nitrite: NEGATIVE
Protein, ur: NEGATIVE mg/dL
Specific Gravity, Urine: 1.014 (ref 1.005–1.030)
pH: 5 (ref 5.0–8.0)

## 2022-02-14 LAB — COMPREHENSIVE METABOLIC PANEL
ALT: 13 U/L (ref 0–44)
AST: 19 U/L (ref 15–41)
Albumin: 4.2 g/dL (ref 3.5–5.0)
Alkaline Phosphatase: 47 U/L (ref 38–126)
Anion gap: 8 (ref 5–15)
BUN: 24 mg/dL — ABNORMAL HIGH (ref 8–23)
CO2: 25 mmol/L (ref 22–32)
Calcium: 9.7 mg/dL (ref 8.9–10.3)
Chloride: 103 mmol/L (ref 98–111)
Creatinine, Ser: 1.3 mg/dL — ABNORMAL HIGH (ref 0.44–1.00)
GFR, Estimated: 40 mL/min — ABNORMAL LOW (ref >60)
Glucose, Bld: 113 mg/dL — ABNORMAL HIGH (ref 70–99)
Potassium: 4.2 mmol/L (ref 3.5–5.1)
Sodium: 136 mmol/L (ref 135–145)
Total Bilirubin: 0.6 mg/dL (ref 0.3–1.2)
Total Protein: 8.1 g/dL (ref 6.5–8.1)

## 2022-02-14 LAB — BRAIN NATRIURETIC PEPTIDE: B Natriuretic Peptide: 200.2 pg/mL — ABNORMAL HIGH (ref 0.0–100.0)

## 2022-02-14 LAB — LIPASE, BLOOD: Lipase: 40 U/L (ref 11–51)

## 2022-02-14 LAB — TSH: TSH: 0.841 u[IU]/mL (ref 0.350–4.500)

## 2022-02-14 LAB — TROPONIN I (HIGH SENSITIVITY): Troponin I (High Sensitivity): 12 ng/L (ref <18)

## 2022-02-14 LAB — T4, FREE: Free T4: 0.75 ng/dL (ref 0.61–1.12)

## 2022-02-14 LAB — SARS CORONAVIRUS 2 BY RT PCR: SARS Coronavirus 2 by RT PCR: NEGATIVE

## 2022-02-14 NOTE — ED Triage Notes (Addendum)
Patient's daughter reports that the patient has had a headache x 2 days. Patient also c/o nausea, but denies blurred vision.  Patient also c/o intermittent lower back pain and has had for some time. Patient's daughter reports more frequent episodes.  Patient's daughter also reports that the patient has been lying in the bed and doing normal activities such as take the garbage out.

## 2022-02-14 NOTE — ED Notes (Signed)
Pt ambulatory to restroom with family standby assistance.

## 2022-02-14 NOTE — ED Provider Notes (Signed)
[facility]-EMERGENCY DEPT Provider Note   CSN: [id number] Arrival date & time: 02/14/22  0915     History  Chief Complaint  Patient presents with   Headache   Back Pain    Anita Martinez is a 88 y.o. female present emergency department with general malaise.  She is here with her daughter who is her caretaker provides supplemental history.  The patient does have a history of class IV congestive heart failure, she is on torsemide as needed but not daily at home, also low-dose metoprolol 25 mg daily, and a statin for high cholesterol, and her daughter reports that the patient has had generalized weakness for 2 days.  She does typically the patient is quite active and getting around the house, but has been too weak to get up and walk around the past 2 days.  She has also had a poor appetite the past 2 days, is drinking small amounts of fluid with medicine but does not want to eat anything.  No diarrhea or constipation.  No complaints of chest pain.  The patient was complaining of midthoracic back pain, where she suffers from chronic back pain, as well as a headache.  She does suffer from intermittent headaches but this 1 seems worse than normal, though she cannot explain why.  The patient is very hard of hearing.  Her daughter checks the patient's blood pressure and weights every day.  There have been no change or gain in weight or leg swelling repeated the past several days.  SBP usually ranges 100-120 mmhg.  HPI     Home Medications Prior to Admission medications   Medication Sig Start Date End Date Taking? Authorizing Provider  acetaminophen (TYLENOL) 500 MG tablet Take 500-1,000 mg by mouth 3 (three) times daily as needed (for pain).    [provider]  atorvastatin (LIPITOR) 10 MG tablet Take 10 mg by mouth at bedtime. 07/19/20   [provider]  ENTRESTO 49-51 MG TAKE 1 TABLET BY MOUTH TWICE DAILY 01/01/22   Tolia, Sunit, DO  feeding supplement,  ENSURE ENLIVE, (ENSURE ENLIVE) LIQD Take 237 mLs by mouth 2 (two) times daily between meals. Patient taking differently: Take 237 mLs by mouth 2 (two) times daily as needed (for nutritional supplementation). 02/08/20   Alessandra Bevels, MD  fish oil-omega-3 fatty acids 1000 MG capsule Take 1 g by mouth daily with breakfast.    [provider]  HYDROcodone-acetaminophen (NORCO/VICODIN) 5-325 MG tablet Take 1 tablet by mouth at bedtime.    [provider]  metoprolol succinate (TOPROL-XL) 25 MG 24 hr tablet TAKE 1 TABLET BY MOUTH EVERY MORNING 02/10/22   Tolia, Sunit, DO  Multiple Vitamin (MULTIVITAMIN WITH MINERALS) TABS tablet Take 1 tablet by mouth daily. 02/09/20   Alessandra Bevels, MD      Allergies    Morphine and related    Review of Systems   Review of Systems  Physical Exam Updated Vital Signs BP 113/86   Pulse 67   Temp 98.3 F (36.8 C) (Oral)   Resp (!) 21   Ht 5\' 5"  (1.651 m)   Wt 57.9 kg   SpO2 100%   BMI 21.23 kg/m  Physical Exam Constitutional:      General: She is not in acute distress. HENT:     Head: Normocephalic and atraumatic.  Eyes:     Conjunctiva/sclera: Conjunctivae normal.     Pupils: Pupils are equal, round, and reactive to light.  Cardiovascular:  Rate and Rhythm: Normal rate and regular rhythm.  Pulmonary:     Effort: Pulmonary effort is normal. No respiratory distress.  Abdominal:     General: There is no distension.     Tenderness: There is no abdominal tenderness.     Comments: Large, soft reducible abdominal hernia  Skin:    General: Skin is warm and dry.  Neurological:     General: No focal deficit present.     Mental Status: She is alert. Mental status is at baseline.     GCS: GCS eye subscore is 4. GCS verbal subscore is 5. GCS motor subscore is 6.     Comments: Kyphosis of the spine, no spinal midline tenderness, patient reporting pain at approximately mid thoracic spine midline    ED Results / Procedures /  Treatments   Labs (all labs ordered are listed, but only abnormal results are displayed) Labs Reviewed  COMPREHENSIVE METABOLIC PANEL - Abnormal; Notable for the following components:      Result Value   Glucose, Bld 113 (*)    BUN 24 (*)    Creatinine, Ser 1.30 (*)    GFR, Estimated 40 (*)    All other components within normal limits  CBC WITH DIFFERENTIAL/PLATELET - Abnormal; Notable for the following components:   RBC 3.78 (*)    MCV 101.3 (*)    All other components within normal limits  BRAIN NATRIURETIC PEPTIDE - Abnormal; Notable for the following components:   B Natriuretic Peptide 200.2 (*)    All other components within normal limits  URINALYSIS, ROUTINE W REFLEX MICROSCOPIC - Abnormal; Notable for the following components:   APPearance HAZY (*)    Hgb urine dipstick SMALL (*)    Bacteria, UA MANY (*)    All other components within normal limits  SARS CORONAVIRUS 2 BY RT PCR  LIPASE, BLOOD  TSH  T4, FREE  TROPONIN I (HIGH SENSITIVITY)    EKG EKG Interpretation  Date/Time:  Friday Feb 14 2022 09:47:13 EDT Ventricular Rate:  69 PR Interval:  181 QRS Duration: 91 QT Interval:  395 QTC Calculation: 424 R Axis:   46 Text Interpretation: Sinus rhythm Confirmed by Alvester Chou [phone]) on 02/14/2022 10:24:59 AM  Radiology No results found.  Procedures Procedures    Medications Ordered in ED Medications - No data to display  ED Course/ Medical Decision Making/ A&P Clinical Course as of 02/14/22 1424  Fri Feb 14, 2022  1157 Nurse assessing pt for UA sample or else will cath [MT]  1315 Patient's work-up was reassuring and unremarkable, reviewed with her family.  Troponin is unremarkable with multiple hours of symptoms, no active chest pain, this seems less likely an ACS issue.  The family and she strongly want to go home, which I think is reasonable.  I denies any localizing symptoms of stroke at this time, the low suspicion for brain bleed. [MT]    Clinical  Course User Index [MT] Ziaire Hagos, Kermit Balo, MD                           Medical Decision Making Amount and/or Complexity of Data Reviewed Labs: ordered. ECG/medicine tests: ordered.   This patient presents to the ED with concern for generalized weakness. This involves an extensive number of treatment options, and is a complaint that carries with it a high risk of complications and morbidity.  The differential diagnosis includes anemia versus dehydration versus infection including UTI versus  COVID versus atypical ACS versus other  Co-morbidities that complicate the patient evaluation: History of congestive heart failure raises concern for recurring congestive heart failure exacerbation  Additional history obtained from family members and daughter at bedside  External records from outside source obtained and reviewed including most recent echo Aug 2022 EF 50-55%, low normal left ventricular function.  Per medical record review, the patient was hospitalized for E. coli bacteremia and sepsis secondary to UTI in August 2022  I ordered and personally interpreted labs.  The pertinent results include: UA without sign of infection, thyroid levels unremarkable, COVID-negative, BNP very mildly elevated near baseline at 200.  Troponin unremarkable.  No acute anemia.  The patient was maintained on a cardiac monitor.  I personally viewed and interpreted the cardiac monitored which showed an underlying rhythm of: Regular heart rate  Per my interpretation the patient's ECG shows regular heart rate, no acute ischemic finding  Patient very very comfortable on exam, not requiring medications in the ED for pain.  Test Considered: Lower suspicion for acute PE or acute stroke, do not feel that emergent CT imaging was indicated at this time.   After the interventions noted above, I reevaluated the patient and found that they have: improved  Dispostion:  After consideration of the diagnostic results and  the patients response to treatment, I feel that the patent would benefit from outpatient PCP follow-up.         Final Clinical Impression(s) / ED Diagnoses Final diagnoses:  Nonintractable headache, unspecified chronicity pattern, unspecified headache type    Rx / DC Orders ED Discharge Orders     None         Terald Sleeper, MD 02/14/22 1424

## 2022-02-18 DIAGNOSIS — E785 Hyperlipidemia, unspecified: Secondary | ICD-10-CM | POA: Diagnosis not present

## 2022-02-18 DIAGNOSIS — I5020 Unspecified systolic (congestive) heart failure: Secondary | ICD-10-CM | POA: Diagnosis not present

## 2022-02-18 DIAGNOSIS — G8929 Other chronic pain: Secondary | ICD-10-CM | POA: Diagnosis not present

## 2022-02-18 DIAGNOSIS — G43009 Migraine without aura, not intractable, without status migrainosus: Secondary | ICD-10-CM | POA: Diagnosis not present

## 2022-02-24 DIAGNOSIS — G894 Chronic pain syndrome: Secondary | ICD-10-CM | POA: Diagnosis not present

## 2022-02-24 DIAGNOSIS — Z79891 Long term (current) use of opiate analgesic: Secondary | ICD-10-CM | POA: Diagnosis not present

## 2022-02-24 DIAGNOSIS — G4700 Insomnia, unspecified: Secondary | ICD-10-CM | POA: Diagnosis not present

## 2022-02-24 DIAGNOSIS — M961 Postlaminectomy syndrome, not elsewhere classified: Secondary | ICD-10-CM | POA: Diagnosis not present

## 2022-03-05 DIAGNOSIS — E46 Unspecified protein-calorie malnutrition: Secondary | ICD-10-CM | POA: Diagnosis not present

## 2022-03-05 DIAGNOSIS — N1831 Chronic kidney disease, stage 3a: Secondary | ICD-10-CM | POA: Diagnosis not present

## 2022-03-05 DIAGNOSIS — I110 Hypertensive heart disease with heart failure: Secondary | ICD-10-CM | POA: Diagnosis not present

## 2022-03-05 DIAGNOSIS — G8929 Other chronic pain: Secondary | ICD-10-CM | POA: Diagnosis not present

## 2022-03-05 DIAGNOSIS — D6869 Other thrombophilia: Secondary | ICD-10-CM | POA: Diagnosis not present

## 2022-03-05 DIAGNOSIS — I428 Other cardiomyopathies: Secondary | ICD-10-CM | POA: Diagnosis not present

## 2022-03-05 DIAGNOSIS — E785 Hyperlipidemia, unspecified: Secondary | ICD-10-CM | POA: Diagnosis not present

## 2022-03-05 DIAGNOSIS — I5022 Chronic systolic (congestive) heart failure: Secondary | ICD-10-CM | POA: Diagnosis not present

## 2022-03-05 DIAGNOSIS — R6883 Chills (without fever): Secondary | ICD-10-CM | POA: Diagnosis not present

## 2022-03-06 DIAGNOSIS — N1831 Chronic kidney disease, stage 3a: Secondary | ICD-10-CM | POA: Diagnosis not present

## 2022-03-28 DIAGNOSIS — G8929 Other chronic pain: Secondary | ICD-10-CM | POA: Diagnosis not present

## 2022-03-28 DIAGNOSIS — N1831 Chronic kidney disease, stage 3a: Secondary | ICD-10-CM | POA: Diagnosis not present

## 2022-03-28 DIAGNOSIS — E785 Hyperlipidemia, unspecified: Secondary | ICD-10-CM | POA: Diagnosis not present

## 2022-03-28 DIAGNOSIS — I5020 Unspecified systolic (congestive) heart failure: Secondary | ICD-10-CM | POA: Diagnosis not present

## 2022-03-28 DIAGNOSIS — I110 Hypertensive heart disease with heart failure: Secondary | ICD-10-CM | POA: Diagnosis not present

## 2022-04-23 ENCOUNTER — Other Ambulatory Visit: Payer: Self-pay

## 2022-04-23 ENCOUNTER — Emergency Department (HOSPITAL_COMMUNITY): Payer: Medicare Other

## 2022-04-23 ENCOUNTER — Observation Stay (HOSPITAL_COMMUNITY)
Admission: EM | Admit: 2022-04-23 | Discharge: 2022-04-24 | Disposition: A | Discharge status: Home / Self Care | Payer: Medicare Other | Attending: Internal Medicine | Admitting: Internal Medicine

## 2022-04-23 ENCOUNTER — Encounter (HOSPITAL_COMMUNITY): Payer: Self-pay | Admitting: *Deleted

## 2022-04-23 DIAGNOSIS — N1831 Chronic kidney disease, stage 3a: Secondary | ICD-10-CM | POA: Diagnosis not present

## 2022-04-23 DIAGNOSIS — Z87891 Personal history of nicotine dependence: Secondary | ICD-10-CM | POA: Diagnosis not present

## 2022-04-23 DIAGNOSIS — Z20822 Contact with and (suspected) exposure to covid-19: Secondary | ICD-10-CM | POA: Insufficient documentation

## 2022-04-23 DIAGNOSIS — I959 Hypotension, unspecified: Secondary | ICD-10-CM | POA: Diagnosis not present

## 2022-04-23 DIAGNOSIS — I83891 Varicose veins of right lower extremities with other complications: Secondary | ICD-10-CM | POA: Diagnosis not present

## 2022-04-23 DIAGNOSIS — D649 Anemia, unspecified: Secondary | ICD-10-CM | POA: Diagnosis not present

## 2022-04-23 DIAGNOSIS — I83892 Varicose veins of left lower extremities with other complications: Secondary | ICD-10-CM | POA: Insufficient documentation

## 2022-04-23 DIAGNOSIS — I5022 Chronic systolic (congestive) heart failure: Secondary | ICD-10-CM | POA: Diagnosis present

## 2022-04-23 DIAGNOSIS — E861 Hypovolemia: Secondary | ICD-10-CM | POA: Insufficient documentation

## 2022-04-23 DIAGNOSIS — R404 Transient alteration of awareness: Secondary | ICD-10-CM | POA: Diagnosis not present

## 2022-04-23 DIAGNOSIS — I9589 Other hypotension: Secondary | ICD-10-CM | POA: Diagnosis not present

## 2022-04-23 DIAGNOSIS — I499 Cardiac arrhythmia, unspecified: Secondary | ICD-10-CM | POA: Diagnosis not present

## 2022-04-23 DIAGNOSIS — M25572 Pain in left ankle and joints of left foot: Secondary | ICD-10-CM | POA: Diagnosis not present

## 2022-04-23 DIAGNOSIS — Z79899 Other long term (current) drug therapy: Secondary | ICD-10-CM | POA: Diagnosis not present

## 2022-04-23 DIAGNOSIS — G8929 Other chronic pain: Secondary | ICD-10-CM | POA: Diagnosis present

## 2022-04-23 DIAGNOSIS — M79672 Pain in left foot: Secondary | ICD-10-CM | POA: Diagnosis not present

## 2022-04-23 DIAGNOSIS — Z743 Need for continuous supervision: Secondary | ICD-10-CM | POA: Diagnosis not present

## 2022-04-23 DIAGNOSIS — I130 Hypertensive heart and chronic kidney disease with heart failure and stage 1 through stage 4 chronic kidney disease, or unspecified chronic kidney disease: Secondary | ICD-10-CM | POA: Insufficient documentation

## 2022-04-23 DIAGNOSIS — R531 Weakness: Secondary | ICD-10-CM | POA: Diagnosis not present

## 2022-04-23 DIAGNOSIS — I83899 Varicose veins of unspecified lower extremities with other complications: Secondary | ICD-10-CM

## 2022-04-23 DIAGNOSIS — I5020 Unspecified systolic (congestive) heart failure: Secondary | ICD-10-CM | POA: Diagnosis not present

## 2022-04-23 DIAGNOSIS — D62 Acute posthemorrhagic anemia: Principal | ICD-10-CM | POA: Insufficient documentation

## 2022-04-23 DIAGNOSIS — E7849 Other hyperlipidemia: Secondary | ICD-10-CM | POA: Diagnosis present

## 2022-04-23 DIAGNOSIS — R6889 Other general symptoms and signs: Secondary | ICD-10-CM | POA: Diagnosis not present

## 2022-04-23 DIAGNOSIS — W19XXXA Unspecified fall, initial encounter: Secondary | ICD-10-CM | POA: Diagnosis not present

## 2022-04-23 LAB — CBC WITH DIFFERENTIAL/PLATELET
Abs Immature Granulocytes: 0.05 10*3/uL (ref 0.00–0.07)
Basophils Absolute: 0 10*3/uL (ref 0.0–0.1)
Basophils Relative: 0 %
Eosinophils Absolute: 0.1 10*3/uL (ref 0.0–0.5)
Eosinophils Relative: 1 %
HCT: 25.6 % — ABNORMAL LOW (ref 36.0–46.0)
Hemoglobin: 8.2 g/dL — ABNORMAL LOW (ref 12.0–15.0)
Immature Granulocytes: 1 %
Lymphocytes Relative: 22 %
Lymphs Abs: 2.2 10*3/uL (ref 0.7–4.0)
MCH: 33.1 pg (ref 26.0–34.0)
MCHC: 32 g/dL (ref 30.0–36.0)
MCV: 103.2 fL — ABNORMAL HIGH (ref 80.0–100.0)
Monocytes Absolute: 0.6 10*3/uL (ref 0.1–1.0)
Monocytes Relative: 6 %
Neutro Abs: 7 10*3/uL (ref 1.7–7.7)
Neutrophils Relative %: 70 %
Platelets: 141 10*3/uL — ABNORMAL LOW (ref 150–400)
RBC: 2.48 MIL/uL — ABNORMAL LOW (ref 3.87–5.11)
RDW: 12.5 % (ref 11.5–15.5)
WBC: 9.9 10*3/uL (ref 4.0–10.5)
nRBC: 0 % (ref 0.0–0.2)

## 2022-04-23 LAB — RESP PANEL BY RT-PCR (FLU A&B, COVID) ARPGX2
Influenza A by PCR: NEGATIVE
Influenza B by PCR: NEGATIVE
SARS Coronavirus 2 by RT PCR: NEGATIVE

## 2022-04-23 LAB — CBC
HCT: 25.2 % — ABNORMAL LOW (ref 36.0–46.0)
HCT: 23.4 % — ABNORMAL LOW (ref 36.0–46.0)
Hemoglobin: 7.9 g/dL — ABNORMAL LOW (ref 12.0–15.0)
Hemoglobin: 7.6 g/dL — ABNORMAL LOW (ref 12.0–15.0)
MCH: 32.9 pg (ref 26.0–34.0)
MCH: 33.2 pg (ref 26.0–34.0)
MCHC: 31.3 g/dL (ref 30.0–36.0)
MCHC: 32.5 g/dL (ref 30.0–36.0)
MCV: 105 fL — ABNORMAL HIGH (ref 80.0–100.0)
MCV: 102.2 fL — ABNORMAL HIGH (ref 80.0–100.0)
Platelets: 141 10*3/uL — ABNORMAL LOW (ref 150–400)
Platelets: 144 10*3/uL — ABNORMAL LOW (ref 150–400)
RBC: 2.4 MIL/uL — ABNORMAL LOW (ref 3.87–5.11)
RBC: 2.29 MIL/uL — ABNORMAL LOW (ref 3.87–5.11)
RDW: 12.5 % (ref 11.5–15.5)
RDW: 12.6 % (ref 11.5–15.5)
WBC: 11.3 10*3/uL — ABNORMAL HIGH (ref 4.0–10.5)
WBC: 11 10*3/uL — ABNORMAL HIGH (ref 4.0–10.5)
nRBC: 0 % (ref 0.0–0.2)
nRBC: 0 % (ref 0.0–0.2)

## 2022-04-23 LAB — FOLATE: Folate: >40 ng/mL (ref >5.9)

## 2022-04-23 LAB — FERRITIN: Ferritin: 168 ng/mL (ref 11–307)

## 2022-04-23 LAB — COMPREHENSIVE METABOLIC PANEL
ALT: 12 U/L (ref 0–44)
AST: 20 U/L (ref 15–41)
Albumin: 3.3 g/dL — ABNORMAL LOW (ref 3.5–5.0)
Alkaline Phosphatase: 32 U/L — ABNORMAL LOW (ref 38–126)
Anion gap: 6 (ref 5–15)
BUN: 19 mg/dL (ref 8–23)
CO2: 24 mmol/L (ref 22–32)
Calcium: 9 mg/dL (ref 8.9–10.3)
Chloride: 106 mmol/L (ref 98–111)
Creatinine, Ser: 1.26 mg/dL — ABNORMAL HIGH (ref 0.44–1.00)
GFR, Estimated: 41 mL/min — ABNORMAL LOW (ref >60)
Glucose, Bld: 104 mg/dL — ABNORMAL HIGH (ref 70–99)
Potassium: 4 mmol/L (ref 3.5–5.1)
Sodium: 136 mmol/L (ref 135–145)
Total Bilirubin: 0.7 mg/dL (ref 0.3–1.2)
Total Protein: 6.2 g/dL — ABNORMAL LOW (ref 6.5–8.1)

## 2022-04-23 LAB — VITAMIN B12: Vitamin B-12: 363 pg/mL (ref 180–914)

## 2022-04-23 LAB — I-STAT CHEM 8, ED
BUN: 20 mg/dL (ref 8–23)
Calcium, Ion: 1.24 mmol/L (ref 1.15–1.40)
Chloride: 104 mmol/L (ref 98–111)
Creatinine, Ser: 1.3 mg/dL — ABNORMAL HIGH (ref 0.44–1.00)
Glucose, Bld: 100 mg/dL — ABNORMAL HIGH (ref 70–99)
HCT: 25 % — ABNORMAL LOW (ref 36.0–46.0)
Hemoglobin: 8.5 g/dL — ABNORMAL LOW (ref 12.0–15.0)
Potassium: 4.1 mmol/L (ref 3.5–5.1)
Sodium: 138 mmol/L (ref 135–145)
TCO2: 21 mmol/L — ABNORMAL LOW (ref 22–32)

## 2022-04-23 LAB — MAGNESIUM: Magnesium: 2.1 mg/dL (ref 1.7–2.4)

## 2022-04-23 LAB — URINALYSIS, ROUTINE W REFLEX MICROSCOPIC
Bilirubin Urine: NEGATIVE
Glucose, UA: NEGATIVE mg/dL
Hgb urine dipstick: NEGATIVE
Ketones, ur: NEGATIVE mg/dL
Leukocytes,Ua: NEGATIVE
Nitrite: NEGATIVE
Protein, ur: NEGATIVE mg/dL
Specific Gravity, Urine: 1.012 (ref 1.005–1.030)
pH: 5 (ref 5.0–8.0)

## 2022-04-23 LAB — PROTIME-INR
INR: 1.1 (ref 0.8–1.2)
Prothrombin Time: 13.8 seconds (ref 11.4–15.2)

## 2022-04-23 LAB — TROPONIN I (HIGH SENSITIVITY)
Troponin I (High Sensitivity): 8 ng/L (ref <18)
Troponin I (High Sensitivity): 9 ng/L (ref <18)

## 2022-04-23 LAB — IRON AND TIBC
Iron: 34 ug/dL (ref 28–170)
Saturation Ratios: 13 % (ref 10.4–31.8)
TIBC: 272 ug/dL (ref 250–450)
UIBC: 238 ug/dL

## 2022-04-23 LAB — LACTIC ACID, PLASMA
Lactic Acid, Venous: 1 mmol/L (ref 0.5–1.9)
Lactic Acid, Venous: 1.2 mmol/L (ref 0.5–1.9)

## 2022-04-23 LAB — ABO/RH: ABO/RH(D): O POS

## 2022-04-23 LAB — TSH: TSH: 0.663 u[IU]/mL (ref 0.350–4.500)

## 2022-04-23 LAB — PREPARE RBC (CROSSMATCH)

## 2022-04-23 MED ORDER — ONDANSETRON HCL 4 MG/2ML IJ SOLN: 4.0000 mg | Freq: Four times a day (QID) | INTRAMUSCULAR | Status: DC | PRN

## 2022-04-23 MED ORDER — SODIUM CHLORIDE 0.9 % IV SOLN: 250.0000 mL | INTRAVENOUS | Status: DC | PRN

## 2022-04-23 MED ORDER — SODIUM CHLORIDE 0.9 % IV SOLN
10.0000 mL/h | Freq: Once | INTRAVENOUS | Status: AC
  Administered 2022-04-24: 10 mL/h via INTRAVENOUS

## 2022-04-23 MED ORDER — ACETAMINOPHEN 325 MG PO TABS: 650.0000 mg | ORAL_TABLET | Freq: Four times a day (QID) | ORAL | Status: DC | PRN

## 2022-04-23 MED ORDER — SODIUM CHLORIDE 0.9% FLUSH: 3.0000 mL | INTRAVENOUS | Status: DC | PRN

## 2022-04-23 MED ORDER — HYDROCODONE-ACETAMINOPHEN 5-325 MG PO TABS: 1.0000 | ORAL_TABLET | Freq: Every evening | ORAL | Status: DC | PRN

## 2022-04-23 MED ORDER — SODIUM CHLORIDE 0.9% FLUSH
3.0000 mL | Freq: Two times a day (BID) | INTRAVENOUS | Status: DC
  Administered 2022-04-23 – 2022-04-24 (×2): 3 mL via INTRAVENOUS

## 2022-04-23 MED ORDER — SODIUM CHLORIDE 0.9 % IV BOLUS
500.0000 mL | Freq: Once | INTRAVENOUS | Status: AC
  Administered 2022-04-23: 500 mL via INTRAVENOUS

## 2022-04-23 MED ORDER — ONDANSETRON HCL 4 MG PO TABS: 4.0000 mg | ORAL_TABLET | Freq: Four times a day (QID) | ORAL | Status: DC | PRN

## 2022-04-23 MED ORDER — ATORVASTATIN CALCIUM 10 MG PO TABS
10.0000 mg | ORAL_TABLET | Freq: Every day | ORAL | Status: DC
  Administered 2022-04-23: 10 mg via ORAL
  Filled 2022-04-23: qty 1

## 2022-04-23 MED ORDER — ACETAMINOPHEN 650 MG RE SUPP: 650.0000 mg | Freq: Four times a day (QID) | RECTAL | Status: DC | PRN

## 2022-04-23 NOTE — ED Notes (Signed)
Put patient on the bed pan  

## 2022-04-23 NOTE — ED Notes (Signed)
Go patient up to the bedside toilet patient did well patient has call bell in reach

## 2022-04-23 NOTE — ED Provider Notes (Signed)
[facility] EMERGENCY DEPARTMENT Provider Note   CSN: [id number] Arrival date & time: 04/23/22  1110     History  Chief Complaint  Patient presents with   [name]    Anita Martinez is a 88 y.o. female.  HPI Patient daughter reports that she goes over every morning to take care of her mother.  She reports she goes at the same time and makes breakfast and helps her with all of her morning activities.  Patient daughter reports that her mother has to come to the front door to open the screen door which she keeps locked.  She reports it took her longer to come this morning and once she got to the door and got it opened she crumpled down beside the door and was weak.  Her daughter denies that she fell or had an injury but just collapsed a little bit.  She was able to help her up and get her into a chair.  She reports she seemed a little confused but was all right.  She then needed to go the bathroom so she was going to take her to the bathroom but identified that the bathroom had extensive amounts of blood all over the floor.  Daughter reports her son was blood she had to clean it up before they could walk into the bathroom.  She reports she then noted that she had blood all over her feet but there was no blood in the patient's depends or elsewhere in the house.  Patient reports she only has pain in her foot but does not have any other chest pain shortness of breath or abdominal pain.  She has been eating per usual.  Her daughter reports she always has a good appetite she has not had vomiting diarrhea fever or chills.  When medics arrived they identified a very small varicose vein rupture wound on the medial left ankle and applied a gauze and Coban.  Bleeding had stopped by the time they evaluated it.    Home Medications Prior to Admission medications   Medication Sig Start Date End Date Taking? Authorizing Provider  acetaminophen (TYLENOL) 500 MG tablet Take 500-1,000 mg by mouth 3  (three) times daily as needed for headache.   Yes [provider]  atorvastatin (LIPITOR) 10 MG tablet Take 10 mg by mouth at bedtime. 07/19/20  Yes [provider]  ENTRESTO 49-51 MG TAKE 1 TABLET BY MOUTH TWICE DAILY Patient taking differently: Take 1 tablet by mouth 2 (two) times daily. 01/01/22  Yes Tolia, Sunit, DO  fish oil-omega-3 fatty acids 1000 MG capsule Take 1 g by mouth daily with breakfast.   Yes [provider]  HYDROcodone-acetaminophen (NORCO/VICODIN) 5-325 MG tablet Take 1 tablet by mouth at bedtime as needed (pain).   Yes [provider]  metoprolol succinate (TOPROL-XL) 25 MG 24 hr tablet TAKE 1 TABLET BY MOUTH EVERY MORNING Patient taking differently: Take 25 mg by mouth every morning. 02/10/22  Yes Tolia, Sunit, DO  Multiple Vitamin (MULTIVITAMIN WITH MINERALS) TABS tablet Take 1 tablet by mouth daily. 02/09/20  Yes Alessandra Bevels, MD  feeding supplement, ENSURE ENLIVE, (ENSURE ENLIVE) LIQD Take 237 mLs by mouth 2 (two) times daily between meals. Patient not taking: Reported on 04/23/2022 02/08/20   Alessandra Bevels, MD      Allergies    Morphine and related    Review of Systems   Review of Systems 10 systems reviewed negative except as per HPI Physical Exam Updated Vital  Signs BP (!) 116/32 (BP Location: Right Arm)   Pulse 63   Temp (!) 96.4 F (35.8 C) (Temporal)   Resp 16   Ht 5\' 5"  (1.651 m)   Wt 57.6 kg   SpO2 100%   BMI 21.13 kg/m  Physical Exam Constitutional:      Comments: Patient is alert.  No respiratory distress.  She is pale in appearance.  HENT:     Head: Normocephalic and atraumatic.     Nose: Nose normal.     Mouth/Throat:     Pharynx: Oropharynx is clear.  Eyes:     Extraocular Movements: Extraocular movements intact.  Cardiovascular:     Rate and Rhythm: Normal rate and regular rhythm.  Pulmonary:     Effort: Pulmonary effort is normal.  Abdominal:     General: There is no distension.      Palpations: Abdomen is soft.     Tenderness: There is no abdominal tenderness. There is no guarding.  Musculoskeletal:     Comments: Lower extremities are symmetric.  No peripheral edema.  Lower legs are very thin.  Feet and lower legs have extensive amounts of superficial varicosities.  There is a lot of dried blood on the feet.  EMS has applied a one by one gauze to the medial left ankle and has a 1 inch Coban around it.  [place] pedis pulses are 1-2+ and symmetric.  No appearance of deformity of the lower leg or foot  Neurological:     General: No focal deficit present.     Mental Status: She is oriented to person, place, and time.     Motor: No weakness.     Coordination: Coordination normal.  Psychiatric:        Mood and Affect: Mood normal.     ED Results / Procedures / Treatments   Labs (all labs ordered are listed, but only abnormal results are displayed) Labs Reviewed  CBC WITH DIFFERENTIAL/PLATELET - Abnormal; Notable for the following components:      Result Value   RBC 2.48 (*)    Hemoglobin 8.2 (*)    HCT 25.6 (*)    MCV 103.2 (*)    Platelets 141 (*)    All other components within normal limits  COMPREHENSIVE METABOLIC PANEL - Abnormal; Notable for the following components:   Glucose, Bld 104 (*)    Creatinine, Ser 1.26 (*)    Total Protein 6.2 (*)    Albumin 3.3 (*)    Alkaline Phosphatase 32 (*)    GFR, Estimated 41 (*)    All other components within normal limits  I-STAT CHEM 8, ED - Abnormal; Notable for the following components:   Creatinine, Ser 1.30 (*)    Glucose, Bld 100 (*)    TCO2 21 (*)    Hemoglobin 8.5 (*)    HCT 25.0 (*)    All other components within normal limits  RESP PANEL BY RT-PCR (FLU A&B, COVID) ARPGX2  LACTIC ACID, PLASMA  PROTIME-INR  URINALYSIS, ROUTINE W REFLEX MICROSCOPIC  LACTIC ACID, PLASMA  CBC  CBC  CBC  MAGNESIUM  TSH  FERRITIN  IRON AND TIBC  FOLATE  VITAMIN B12  TYPE AND SCREEN  PREPARE RBC (CROSSMATCH)  ABO/RH   TROPONIN I (HIGH SENSITIVITY)  TROPONIN I (HIGH SENSITIVITY)    EKG EKG Interpretation  Date/Time:  Wednesday April 23 2022 11:19:10 EDT Ventricular Rate:  59 PR Interval:  192 QRS Duration: 93 QT Interval:  417 QTC Calculation: 414  R Axis:   87 Text Interpretation: Sinus rhythm no sig change from older tracings Confirmed by Arby Barrette [phone]) on 04/23/2022 1:19:34 PM  Radiology DG Foot Complete Left  Result Date: 04/23/2022 CLINICAL DATA:  Fall, pain EXAM: LEFT FOOT - COMPLETE 3+ VIEW COMPARISON:  None Available. FINDINGS: Osteopenia. There is no evidence of fracture or dislocation. There is no evidence of arthropathy or other focal bone abnormality. Soft tissues are unremarkable. IMPRESSION: Osteopenia.  No displaced fracture or dislocation of the left foot. Electronically Signed   By: Jearld Lesch M.D.   On: 04/23/2022 12:17   DG Tibia/Fibula Left  Result Date: 04/23/2022 CLINICAL DATA:  Left ankle pain and bleeding. EXAM: LEFT TIBIA AND FIBULA - 2 VIEW COMPARISON:  None Available. FINDINGS: There is no evidence of fracture or other focal bone lesions. No significant soft tissue swelling. IMPRESSION: Negative. Electronically Signed   By: Ted Mcalpine M.D.   On: 04/23/2022 12:16   DG Chest Port 1 View  Result Date: 04/23/2022 CLINICAL DATA:  Weakness. EXAM: PORTABLE CHEST 1 VIEW COMPARISON:  April 26, 2021 FINDINGS: Normal cardiac silhouette. Heavy calcific atherosclerotic disease and tortuosity of the aorta. Clear lungs.  No evidence of pleural effusion on this frontal view. IMPRESSION: 1. No active disease. 2. Heavy calcific atherosclerotic disease and tortuosity of the aorta. Electronically Signed   By: Ted Mcalpine M.D.   On: 04/23/2022 12:15    Procedures Procedures   CRITICAL CARE Performed by: Arby Barrette   Total critical care time: 30 minutes  Critical care time was exclusive of separately billable procedures and treating other  patients.  Critical care was necessary to treat or prevent imminent or life-threatening deterioration.  Critical care was time spent personally by me on the following activities: development of treatment plan with patient and/or surrogate as well as nursing, discussions with consultants, evaluation of patient's response to treatment, examination of patient, obtaining history from patient or surrogate, ordering and performing treatments and interventions, ordering and review of laboratory studies, ordering and review of radiographic studies, pulse oximetry and re-evaluation of patient's condition.  Medications Ordered in ED Medications  0.9 %  sodium chloride infusion (has no administration in time range)  HYDROcodone-acetaminophen (NORCO/VICODIN) 5-325 MG per tablet 1 tablet (has no administration in time range)  atorvastatin (LIPITOR) tablet 10 mg (has no administration in time range)  sodium chloride flush (NS) 0.9 % injection 3 mL (has no administration in time range)  sodium chloride flush (NS) 0.9 % injection 3 mL (has no administration in time range)  0.9 %  sodium chloride infusion (has no administration in time range)  acetaminophen (TYLENOL) tablet 650 mg (has no administration in time range)    Or  acetaminophen (TYLENOL) suppository 650 mg (has no administration in time range)  ondansetron (ZOFRAN) tablet 4 mg (has no administration in time range)    Or  ondansetron (ZOFRAN) injection 4 mg (has no administration in time range)  sodium chloride 0.9 % bolus 500 mL (0 mLs Intravenous Stopped 04/23/22 1257)  sodium chloride 0.9 % bolus 500 mL (500 mLs Intravenous New Bag/Given 04/23/22 1311)    ED Course/ Medical Decision Making/ A&P                           Medical Decision Making Amount and/or Complexity of Data Reviewed Labs: ordered. Radiology: ordered.  Risk Prescription drug management. Decision regarding hospitalization.   Patient presents as outlined.  She had  extensive amount of bleeding at home but it was controlled by the time EMS arrived.  Source is a small ruptured varicosity on the ankle.  It is unclear how this occurred.  The patient's daughter checks her every day.  She was well at night.  Patient's blood pressures have been consistently low in the 80s systolic.  Will obtain CBC, type and screen, EKG and other screening lab work for general weakness as well as blood loss.   500 cc normal saline initiated for hypotension.  Patient is not toxic or septic in appearance.  Will obtain lactic acid  , Urinalysis and chest x-ray but at this time do not think any empiric antibiotics are indicated.  Hypovolemia due to blood loss appears to be more likely source for hypotension.  However patient is tolerating this well with clear mental status, no respiratory distress.  After 500 cc bolus of fluids, pressures remain soft.  Patient's mental status however is improved.  She is asking for food and is becoming very verbally active.  Patient's daughter reports when the patient is well she will complain about many things but that is very normal and to be expected.  She reports that means she is improving.  With soft blood pressures we will add another 500 cc normal saline and patient may start oral intake.  Blood pressures have persistent in the 80s and looking at blood counts, hemoglobin in May has an isolated value of 12.5.  Remainder of blood counts from 2022 are in the 8-9 range which is much closer to the patient's baseline.  Due to soft blood pressures I had ordered to units of packed red blood cells however after completing about 750 cc normal saline, patient's blood pressures have now trended up and she is in the 110s to 120s systolic.  She is much more active and talking.  She is showing no signs of distress.  Clinically improved.  At this time we will hold on the blood transfusion and plan for admission for observation and trending hemoglobins.  There is no source  of active or ongoing bleeding.  There is not been any more bleeding through or around the dressing on the ankle  Ankle, foot and lower leg x-rays obtained and interpreted by radiology is negative for any acute fractures  Consult: Reviewed with Dr. Sheppard Penton [facility] hospitalist for admission.        Final Clinical Impression(s) / ED Diagnoses Final diagnoses:  Bleeding from varicose vein  Hypotension due to hypovolemia    Rx / DC Orders ED Discharge Orders     None         Arby Barrette, MD 04/23/22 1439

## 2022-04-23 NOTE — Plan of Care (Signed)

## 2022-04-23 NOTE — ED Notes (Signed)
Did ekg shown to Dr Donnald Garre

## 2022-04-23 NOTE — Assessment & Plan Note (Addendum)
88 year old female presenting with acute on chronic symptomatic anemia with hgb down to 8.1 with dizziness, lightheadedness, weakness and hypotension likely from bleeding varicosity.  -obs to telemetry -hgb 2 months ago was 12.4, but baseline over the past year appears to be around 9.5-9.9. no hx of blood transfusions.  -serial cbc q 6 hours. Transfuse if hgb <7.0  -check iron studies, b12/folate -after 1L IVF she is feeling much better. Varicosity is not bleeding/stable.  -check orthostatics -hold entresto/toprol for now  -TED hose-recommend for home as well  -PT to eval as she lives alone at home

## 2022-04-23 NOTE — Assessment & Plan Note (Signed)
Continue lipitor 10mg daily  

## 2022-04-23 NOTE — Plan of Care (Signed)

## 2022-04-23 NOTE — Assessment & Plan Note (Addendum)
Continue norco prn QHS pmp website checked and verified. Fills and takes as prescribed

## 2022-04-23 NOTE — ED Notes (Addendum)
Green and lavendar tube sent to lab for repeat trop and cbc. Dr. Artis Flock admitting at [facility]. Pt on BSC. PT alert, NAD, calm, interactive. Daughter at [facility].

## 2022-04-23 NOTE — ED Triage Notes (Addendum)
BIB [facility] from home for L ankle pain, and L ankle varicose vein bleeding, as well as possible fall. Pt lives alone, nearby daughter visits frequently, found in living room with evidence in b/r of recent bleeding, pt with active bleeding from L ankle varicose vein, family returned pt to b/r where she was upon EMS arrival. C/o ankle pain. Bleeding has stopped. No active bleeding. C/o ankle pain. Pt alert, NAD, calm, interactive, some confusion. Oriented to person, place, and circumstances. Disoriented to time. EMS started NSL and gave NS 500cc. Estimated blood loss 300-500cc. EMS reports per daughter possible fall.

## 2022-04-23 NOTE — ED Notes (Signed)
ED TO INPATIENT HANDOFF REPORT  ED Nurse Name and Phone #: Baxter Flattery, RN  S Name/Age/Gender Anita Martinez 88 y.o. female Room/Bed: 038C/038C  Code Status   Code Status: Full Code  Home/SNF/Other Home Patient oriented to: self, place, and time Is this baseline? Yes   Triage Complete: Triage complete  Chief Complaint Symptomatic anemia [D64.9]  Triage Note BIB [facility] from home for L ankle pain, and L ankle varicose vein bleeding, as well as possible fall. Pt lives alone, nearby daughter visits frequently, found in living room with evidence in b/r of recent bleeding, pt with active bleeding from L ankle varicose vein, family returned pt to b/r where she was upon EMS arrival. C/o ankle pain. Bleeding has stopped. No active bleeding. C/o ankle pain. Pt alert, NAD, calm, interactive, some confusion. Oriented to person, place, and circumstances. Disoriented to time. EMS started NSL and gave NS 500cc. Estimated blood loss 300-500cc. EMS reports per daughter possible fall.    Allergies Allergies  Allergen Reactions   Morphine And Related Other (See Comments)    Makes patient hyper and unable to sleep    Level of Care/Admitting Diagnosis ED Disposition     ED Disposition  Admit   Condition  --   [place]: [place] [[id number]]  Level of Care: Telemetry Medical [104]  May place patient in observation at [facility] or [facility] if equivalent level of care is available:: Yes  Covid Evaluation: Asymptomatic - no recent exposure (last 10 days) testing not required  Diagnosis: Symptomatic anemia [id number]  Admitting Physician: Orma Flaming [id number]  Attending Physician: Orma Flaming [id number]          B Medical/Surgery History Past Medical History:  Diagnosis Date   Cardiomyopathy ([place])    CHF (congestive heart failure) ([place])    Chronic back pain    Colon polyps    Colonic diverticular abscess    MR (mitral regurgitation)    TR  (tricuspid regurgitation)    Past Surgical History:  Procedure Laterality Date   ABDOMINAL HYSTERECTOMY     BLADDER SUSPENSION       A IV Location/Drains/Wounds Patient Lines/Drains/Airways Status     Active Line/Drains/Airways     Name Placement date Placement time Site Days   Peripheral IV 04/23/22 22 G 1" Left Antecubital 04/23/22  1120  Antecubital  less than 1   Pressure Injury 01/30/20 Sacrum Medial Stage 2 -  Partial thickness loss of dermis presenting as a shallow open injury with a red, pink wound bed without slough. 01/30/20  1746  -- 814   Pressure Injury 02/06/20 Back Medial;Upper Stage 1 -  Intact skin with non-blanchable redness of a localized area usually over a bony prominence. red over bone 02/06/20  1100  -- 807   Pressure Injury 02/06/20 Sacrum Medial;Lower Stage 1 -  Intact skin with non-blanchable redness of a localized area usually over a bony prominence. redness, no broken skin 02/06/20  1100  -- 807            Intake/Output Last 24 hours  Intake/Output Summary (Last 24 hours) at 04/23/2022 1519 Last data filed at 04/23/2022 1257 Gross per 24 hour  Intake 1000 ml  Output 500 ml  Net 500 ml    Labs/Imaging Results for orders placed or performed during the hospital encounter of 04/23/22 (from the past 48 hour(s))  CBC with Differential     Status: Abnormal   Collection Time: 04/23/22 11:15 AM  Result Value Ref Range   WBC 9.9 4.0 - 10.5 K/uL   RBC 2.48 (L) 3.87 - 5.11 MIL/uL   Hemoglobin 8.2 (L) 12.0 - 15.0 g/dL   HCT 75.6 (L) 43.3 - 29.5 %   MCV 103.2 (H) 80.0 - 100.0 fL   MCH 33.1 26.0 - 34.0 pg   MCHC 32.0 30.0 - 36.0 g/dL   RDW 18.8 41.6 - 60.6 %   Platelets 141 (L) 150 - 400 K/uL    Comment: REPEATED TO VERIFY   nRBC 0.0 0.0 - 0.2 %   Neutrophils Relative % 70 %   Neutro Abs 7.0 1.7 - 7.7 K/uL   Lymphocytes Relative 22 %   Lymphs Abs 2.2 0.7 - 4.0 K/uL   Monocytes Relative 6 %   Monocytes Absolute 0.6 0.1 - 1.0 K/uL   Eosinophils  Relative 1 %   Eosinophils Absolute 0.1 0.0 - 0.5 K/uL   Basophils Relative 0 %   Basophils Absolute 0.0 0.0 - 0.1 K/uL   Immature Granulocytes 1 %   Abs Immature Granulocytes 0.05 0.00 - 0.07 K/uL    Comment: Performed at [facility], [address], [place], [place] [address]  Comprehensive metabolic panel     Status: Abnormal   Collection Time: 04/23/22 11:15 AM  Result Value Ref Range   Sodium 136 135 - 145 mmol/L   Potassium 4.0 3.5 - 5.1 mmol/L   Chloride 106 98 - 111 mmol/L   CO2 24 22 - 32 mmol/L   Glucose, Bld 104 (H) 70 - 99 mg/dL    Comment: Glucose reference range applies only to samples taken after fasting for at least 8 hours.   BUN 19 8 - 23 mg/dL   Creatinine, Ser 1.09 (H) 0.44 - 1.00 mg/dL   Calcium 9.0 8.9 - 32.3 mg/dL   Total Protein 6.2 (L) 6.5 - 8.1 g/dL   Albumin 3.3 (L) 3.5 - 5.0 g/dL   AST 20 15 - 41 U/L   ALT 12 0 - 44 U/L   Alkaline Phosphatase 32 (L) 38 - 126 U/L   Total Bilirubin 0.7 0.3 - 1.2 mg/dL   GFR, Estimated 41 (L) >60 mL/min    Comment: (NOTE) Calculated using the CKD-EPI Creatinine Equation (2021)    Anion gap 6 5 - 15    Comment: Performed at [facility], [address], [place], [place] [address]  Troponin I (High Sensitivity)     Status: None   Collection Time: 04/23/22 11:15 AM  Result Value Ref Range   Troponin I (High Sensitivity) 8 <18 ng/L    Comment: (NOTE) Elevated high sensitivity troponin I (hsTnI) values and significant  changes across serial measurements may suggest ACS but many other  chronic and acute conditions are known to elevate hsTnI results.  Refer to the "Links" section for chest pain algorithms and additional  guidance. Performed at [facility], [address] [place], [place] [address]   Protime-INR     Status: None   Collection Time: 04/23/22 11:15 AM  Result Value Ref Range   Prothrombin Time 13.8 11.4 - 15.2 seconds   INR 1.1 0.8 - 1.2    Comment: (NOTE) INR goal varies based on  device and disease states. Performed at [facility], [address], [place], [place] [address]   Type and screen [facility]     Status: None   Collection Time: 04/23/22 11:15 AM  Result Value  Ref Range   ABO/RH(D) O POS    Antibody Screen NEG    Sample Expiration      04/26/2022,2359 Performed at [facility], [address] [place], [place] [address]   Lactic acid, plasma     Status: None   Collection Time: 04/23/22 11:48 AM  Result Value Ref Range   Lactic Acid, Venous 1.0 0.5 - 1.9 mmol/L    Comment: Performed at [facility], [address], [place], [place] [address]  Resp Panel by RT-PCR (Flu A&B, Covid) Anterior Nasal Swab     Status: None   Collection Time: 04/23/22 11:49 AM   Specimen: Anterior Nasal Swab  Result Value Ref Range   SARS Coronavirus 2 by RT PCR NEGATIVE NEGATIVE    Comment: (NOTE) SARS-CoV-2 target nucleic acids are NOT DETECTED.  The SARS-CoV-2 RNA is generally detectable in upper respiratory specimens during the acute phase of infection. The lowest concentration of SARS-CoV-2 viral copies this assay can detect is 138 copies/mL. A negative result does not preclude SARS-Cov-2 infection and should not be used as the sole basis for treatment or other patient management decisions. A negative result may occur with  improper specimen collection/handling, submission of specimen other than nasopharyngeal swab, presence of viral mutation(s) within the areas targeted by this assay, and inadequate number of viral copies(<138 copies/mL). A negative result must be combined with clinical observations, patient history, and epidemiological information. The expected result is Negative.  Fact Sheet for Patients:  BloggerCourse.com  Fact Sheet for Healthcare Providers:  SeriousBroker.it  This test is no t yet approved or cleared by the [place] FDA and  has been authorized  for detection and/or diagnosis of SARS-CoV-2 by FDA under an Emergency Use Authorization (EUA). This EUA will remain  in effect (meaning this test can be used) for the duration of the COVID-19 declaration under Section 564(b)(1) of the Act, 21 U.S.C.section 360bbb-3(b)(1), unless the authorization is terminated  or revoked sooner.       Influenza A by PCR NEGATIVE NEGATIVE   Influenza B by PCR NEGATIVE NEGATIVE    Comment: (NOTE) The Xpert Xpress SARS-CoV-2/FLU/RSV plus assay is intended as an aid in the diagnosis of influenza from Nasopharyngeal swab specimens and should not be used as a sole basis for treatment. Nasal washings and aspirates are unacceptable for Xpert Xpress SARS-CoV-2/FLU/RSV testing.  Fact Sheet for Patients: BloggerCourse.com  Fact Sheet for Healthcare Providers: SeriousBroker.it  This test is not yet approved or cleared by the [place] FDA and has been authorized for detection and/or diagnosis of SARS-CoV-2 by FDA under an Emergency Use Authorization (EUA). This EUA will remain in effect (meaning this test can be used) for the duration of the COVID-19 declaration under Section 564(b)(1) of the Act, 21 U.S.C. section 360bbb-3(b)(1), unless the authorization is terminated or revoked.  Performed at [facility], [address], [place], [place] [address]   I-stat chem 8, ED     Status: Abnormal   Collection Time: 04/23/22 12:05 PM  Result Value Ref Range   Sodium 138 135 - 145 mmol/L   Potassium 4.1 3.5 - 5.1 mmol/L   Chloride 104 98 - 111 mmol/L   BUN 20 8 - 23 mg/dL   Creatinine, Ser 0.10 (H) 0.44 - 1.00 mg/dL   Glucose, Bld 272 (H) 70 - 99 mg/dL    Comment: Glucose reference range applies only to samples taken after fasting for at least 8 hours.  Calcium, Ion 1.24 1.15 - 1.40 mmol/L   TCO2 21 (L) 22 - 32 mmol/L   Hemoglobin 8.5 (L) 12.0 - 15.0 g/dL   HCT 25.0 (L) 36.0 - 46.0 %  ABO/Rh      Status: None   Collection Time: 04/23/22  1:08 PM  Result Value Ref Range   ABO/RH(D)      O POS Performed at [facility] [address] [place], [place] [address]   Prepare RBC (crossmatch)     Status: None   Collection Time: 04/23/22  1:21 PM  Result Value Ref Range   Order Confirmation      ORDER PROCESSED BY BLOOD BANK Performed at [facility], [place] [address], [place], [place] [address]   Urinalysis, Routine w reflex microscopic     Status: None   Collection Time: 04/23/22  1:27 PM  Result Value Ref Range   Color, Urine YELLOW YELLOW   APPearance CLEAR CLEAR   Specific Gravity, Urine 1.012 1.005 - 1.030   pH 5.0 5.0 - 8.0   Glucose, UA NEGATIVE NEGATIVE mg/dL   Hgb urine dipstick NEGATIVE NEGATIVE   Bilirubin Urine NEGATIVE NEGATIVE   Ketones, ur NEGATIVE NEGATIVE mg/dL   Protein, ur NEGATIVE NEGATIVE mg/dL   Nitrite NEGATIVE NEGATIVE   Leukocytes,Ua NEGATIVE NEGATIVE    Comment: Performed at [facility] [address] [place], [place] [address]  CBC     Status: Abnormal   Collection Time: 04/23/22  2:00 PM  Result Value Ref Range   WBC 11.3 (H) 4.0 - 10.5 K/uL   RBC 2.40 (L) 3.87 - 5.11 MIL/uL   Hemoglobin 7.9 (L) 12.0 - 15.0 g/dL   HCT 25.2 (L) 36.0 - 46.0 %   MCV 105.0 (H) 80.0 - 100.0 fL   MCH 32.9 26.0 - 34.0 pg   MCHC 31.3 30.0 - 36.0 g/dL   RDW 12.5 11.5 - 15.5 %   Platelets 141 (L) 150 - 400 K/uL    Comment: REPEATED TO VERIFY   nRBC 0.0 0.0 - 0.2 %    Comment: Performed at [facility], [place] [address], [place], [place] [address]   DG Foot Complete Left  Result Date: 04/23/2022 CLINICAL DATA:  Fall, pain EXAM: LEFT FOOT - COMPLETE 3+ VIEW COMPARISON:  None Available. FINDINGS: Osteopenia. There is no evidence of fracture or dislocation. There is no evidence of arthropathy or other focal bone abnormality. Soft tissues are unremarkable. IMPRESSION: Osteopenia.  No displaced fracture or dislocation of the left foot.  Electronically Signed   By: Delanna Ahmadi M.D.   On: 04/23/2022 12:17   DG Tibia/Fibula Left  Result Date: 04/23/2022 CLINICAL DATA:  Left ankle pain and bleeding. EXAM: LEFT TIBIA AND FIBULA - 2 VIEW COMPARISON:  None Available. FINDINGS: There is no evidence of fracture or other focal bone lesions. No significant soft tissue swelling. IMPRESSION: Negative. Electronically Signed   By: Fidela Salisbury M.D.   On: 04/23/2022 12:16   DG Chest Port 1 View  Result Date: 04/23/2022 CLINICAL DATA:  Weakness. EXAM: PORTABLE CHEST 1 VIEW COMPARISON:  April 26, 2021 FINDINGS: Normal cardiac silhouette. Heavy calcific atherosclerotic disease and tortuosity of the aorta. Clear lungs.  No evidence of pleural effusion on this frontal view. IMPRESSION: 1. No active disease. 2. Heavy calcific atherosclerotic disease and tortuosity of the aorta. Electronically Signed   By: Fidela Salisbury M.D.   On: 04/23/2022 12:15    Pending Labs Unresulted  Labs (From admission, onward)     Start     Ordered   04/24/22 [id number]  Basic metabolic panel  Tomorrow morning,   R        04/23/22 1415   04/23/22 1428  Ferritin  Once,   R        04/23/22 1427   04/23/22 1428  Iron and TIBC  Once,   R        04/23/22 1427   04/23/22 1428  Folate  Once,   R        04/23/22 1427   04/23/22 1428  Vitamin B12  Once,   R        04/23/22 1427   04/23/22 1415  Magnesium  Once,   R        04/23/22 1415   04/23/22 1415  TSH  Once,   R        04/23/22 1415   04/23/22 1353  CBC  Now then every 6 hours,   R (with TIMED occurrences)      04/23/22 1352   04/23/22 1148  Lactic acid, plasma  Now then every 2 hours,   R (with STAT occurrences)      04/23/22 1149            Vitals/Pain Today's Vitals   04/23/22 1245 04/23/22 1300 04/23/22 1335 04/23/22 1445  BP: (!) 81/42 (!) 106/36 (!) 116/32 (!) 109/46  Pulse: (!) 52 (!) 59 63 64  Resp: 14 16 16 18   Temp:      TempSrc:      SpO2: 100% 100% 100% 98%  Weight:      Height:         Isolation Precautions No active isolations  Medications Medications  0.9 %  sodium chloride infusion (has no administration in time range)  HYDROcodone-acetaminophen (NORCO/VICODIN) 5-325 MG per tablet 1 tablet (has no administration in time range)  atorvastatin (LIPITOR) tablet 10 mg (has no administration in time range)  sodium chloride flush (NS) 0.9 % injection 3 mL (3 mLs Intravenous Not Given 04/23/22 1456)  sodium chloride flush (NS) 0.9 % injection 3 mL (has no administration in time range)  0.9 %  sodium chloride infusion (has no administration in time range)  acetaminophen (TYLENOL) tablet 650 mg (has no administration in time range)    Or  acetaminophen (TYLENOL) suppository 650 mg (has no administration in time range)  ondansetron (ZOFRAN) tablet 4 mg (has no administration in time range)    Or  ondansetron (ZOFRAN) injection 4 mg (has no administration in time range)  sodium chloride 0.9 % bolus 500 mL (0 mLs Intravenous Stopped 04/23/22 1257)  sodium chloride 0.9 % bolus 500 mL (0 mLs Intravenous Stopped 04/23/22 1457)    Mobility walks with device High fall risk   Focused Assessments Cardiac Assessment Handoff:  Cardiac Rhythm: Normal sinus rhythm No results found for: "CKTOTAL", "CKMB", "CKMBINDEX", "TROPONINI" Lab Results  Component Value Date   DDIMER 6.55 (H) 01/27/2020   Does the Patient currently have chest pain? No    R Recommendations: See Admitting Provider Note  Report given to:   Additional Notes:

## 2022-04-23 NOTE — ED Notes (Signed)
Xray at [facility]. Family at [facility]. Med rec tech at [facility]

## 2022-04-23 NOTE — H&P (Signed)
History and Physical    Patient: Anita Martinez UUV:[id number] DOB: 1933/05/11 DOA: 04/23/2022 DOS: the patient was seen and examined on 04/23/2022 PCP: [name]  Patient coming from: Home - lives alone. Has walker/cane, but doesn't use. Daughter checks on her daily.    Chief Complaint: weakness   HPI: Anita Martinez is a 88 y.o. female with medical history significant of HLD, chronic pain syndrome, CKD stage 3a, HFpEF who presented to ED with weakness. Her daughter went to check on her this AM and she was weak and then slid down to the floor after she opened up the door for her daughter. She had dried blood all over her feet. No blood around her, but her daughter went to the bathroom found the bathroom covered in blood. She has varicosities and appeared to have bled from this. She doesn't recall hitting or leg or any trauma. Recalls this likely happened last night, but can't give much other history. She states this morning she was light headed and dizzy at times and just didn't feel normal. NO fever/chills, chest pain, palpitations, shortness of breath. She states she has been eating and drinking well. Her daughter does cook/bring her breakfast every morning and does her med/food for her.    Denies any fever/chills, vision changes/headaches, chest pain or palpitations, shortness of breath or cough, abdominal pain, N/V/D, dysuria or leg swelling.   She does not smoke or drink.    ER Course:  vitals: temp: 96.4, bp: 104/58, HR: 61, RR: 20, oxygen: 100%RA Pertinent labs: hgb: 8.2, creatinine: 1.26,  CXR: no active disease Left foot xray: no acute finding Left leg xray: no acute finding In ED: given 1L bolus. TRH asked to admit.    Review of Systems: As mentioned in the history of present illness. All other systems reviewed and are negative. Past Medical History:  Diagnosis Date   Cardiomyopathy (HCC)    CHF (congestive heart failure) (HCC)    Chronic back pain    Colon  polyps    Colonic diverticular abscess    MR (mitral regurgitation)    TR (tricuspid regurgitation)    Past Surgical History:  Procedure Laterality Date   ABDOMINAL HYSTERECTOMY     BLADDER SUSPENSION     Social History:  reports that she quit smoking about 53 years ago. Her smoking use included cigarettes. She started smoking about 67 years ago. She has a 14.00 pack-year smoking history. She has never used smokeless tobacco. She reports that she does not drink alcohol and does not use drugs.  Allergies  Allergen Reactions   Morphine And Related Other (See Comments)    Makes patient hyper and unable to sleep    History reviewed. No pertinent family history.  Prior to Admission medications   Medication Sig Start Date End Date Taking? Authorizing Provider  acetaminophen (TYLENOL) 500 MG tablet Take 500-1,000 mg by mouth 3 (three) times daily as needed for headache.   Yes [provider]  atorvastatin (LIPITOR) 10 MG tablet Take 10 mg by mouth at bedtime. 07/19/20  Yes [provider]  ENTRESTO 49-51 MG TAKE 1 TABLET BY MOUTH TWICE DAILY Patient taking differently: Take 1 tablet by mouth 2 (two) times daily. 01/01/22  Yes Tolia, Sunit, DO  fish oil-omega-3 fatty acids 1000 MG capsule Take 1 g by mouth daily with breakfast.   Yes [provider]  HYDROcodone-acetaminophen (NORCO/VICODIN) 5-325 MG tablet Take 1 tablet by mouth at bedtime as needed (pain).   Yes  [provider]  metoprolol succinate (TOPROL-XL) 25 MG 24 hr tablet TAKE 1 TABLET BY MOUTH EVERY MORNING Patient taking differently: Take 25 mg by mouth every morning. 02/10/22  Yes Tolia, Sunit, DO  Multiple Vitamin (MULTIVITAMIN WITH MINERALS) TABS tablet Take 1 tablet by mouth daily. 02/09/20  Yes Alessandra Bevels, MD  feeding supplement, ENSURE ENLIVE, (ENSURE ENLIVE) LIQD Take 237 mLs by mouth 2 (two) times daily between meals. Patient not taking: Reported on 04/23/2022 02/08/20   Alessandra Bevels, MD    Physical Exam: Vitals:   04/23/22 1230 04/23/22 1245 04/23/22 1300 04/23/22 1335  BP: (!) 89/41 (!) 81/42 (!) 106/36 (!) 116/32  Pulse: (!) 51 (!) 52 (!) 59 63  Resp: 18 14 16 16   Temp:      TempSrc:      SpO2: 100% 100% 100% 100%  Weight:      Height:       General:  Appears calm and comfortable and is in NAD Eyes:  PERRL, EOMI, normal lids, iris ENT:  HOH. lips & tongue, mmm; appropriate dentition Neck:  no LAD, masses or thyromegaly; no carotid bruits Cardiovascular:  RRR, +systolic murmur. No LE edema.  Respiratory:   CTA bilaterally with no wheezes/rales/rhonchi.  Normal respiratory effort. Abdomen:  soft, NT, ND, NABS Back:   normal alignment, no CVAT Skin:  no rash or induration seen on limited exam. Dried, splattered blood on her bilateral feet. No open wounds. Varicosity on left lower leg with coban wrapped around it. No active bleed.  Musculoskeletal:  grossly normal tone BUE/BLE, good ROM, no bony abnormality Lower extremity:  No LE edema.  Limited foot exam with no ulcerations.  2+ distal pulses. Psychiatric:  grossly normal mood and affect, speech fluent and appropriate, AOx3 Neurologic:  CN 2-12 grossly intact, moves all extremities in coordinated fashion, sensation intact   Radiological Exams on Admission: Independently reviewed - see discussion in A/P where applicable  DG Foot Complete Left  Result Date: 04/23/2022 CLINICAL DATA:  Fall, pain EXAM: LEFT FOOT - COMPLETE 3+ VIEW COMPARISON:  None Available. FINDINGS: Osteopenia. There is no evidence of fracture or dislocation. There is no evidence of arthropathy or other focal bone abnormality. Soft tissues are unremarkable. IMPRESSION: Osteopenia.  No displaced fracture or dislocation of the left foot. Electronically Signed   By: 06/23/2022 M.D.   On: 04/23/2022 12:17   DG Tibia/Fibula Left  Result Date: 04/23/2022 CLINICAL DATA:  Left ankle pain and bleeding. EXAM: LEFT TIBIA AND FIBULA - 2 VIEW  COMPARISON:  None Available. FINDINGS: There is no evidence of fracture or other focal bone lesions. No significant soft tissue swelling. IMPRESSION: Negative. Electronically Signed   By: 06/23/2022 M.D.   On: 04/23/2022 12:16   DG Chest Port 1 View  Result Date: 04/23/2022 CLINICAL DATA:  Weakness. EXAM: PORTABLE CHEST 1 VIEW COMPARISON:  April 26, 2021 FINDINGS: Normal cardiac silhouette. Heavy calcific atherosclerotic disease and tortuosity of the aorta. Clear lungs.  No evidence of pleural effusion on this frontal view. IMPRESSION: 1. No active disease. 2. Heavy calcific atherosclerotic disease and tortuosity of the aorta. Electronically Signed   By: April 28, 2021 M.D.   On: 04/23/2022 12:15    EKG: Independently reviewed.  NSR with rate 65; nonspecific ST changes with no evidence of acute ischemia   Labs on Admission: I have personally reviewed the available labs and imaging studies at the time of the admission.  Pertinent labs:   hgb: 8.2, creatinine:  1.26  Assessment and Plan: Principal Problem:   Symptomatic anemia Active Problems:   Chronic kidney disease, stage 3a (HCC)   Chronic HFrEF (heart failure with reduced ejection fraction) (HCC)   Chronic back pain   Other hyperlipidemia    Assessment and Plan: * Symptomatic anemia 88 year old female presenting with acute on chronic symptomatic anemia with hgb down to 8.1 with dizziness, lightheadedness, weakness and hypotension likely from bleeding varicosity.  -obs to telemetry -hgb 2 months ago was 12.4, but baseline over the past year appears to be around 9.5-9.9. no hx of blood transfusions.  -serial cbc q 6 hours. Transfuse if hgb <7.0  -check iron studies, b12/folate -after 1L IVF she is feeling much better. Varicosity is not bleeding/stable.  -check orthostatics -hold entresto/toprol for now  -TED hose-recommend for home as well  -PT to eval as she lives alone at home   Chronic kidney disease, stage 3a  ([place]) Baseline appears to be around 1.2-1.3 Stable, continue to monitor   Chronic HFrEF (heart failure with reduced ejection fraction) ([place]) Appears euvolemic Last echo: 04/2021: EF of 50-55%. Grade 1 DD.  Hold entresto and toprol with soft blood pressure Strict I/O  Chronic back pain Continue norco prn QHS pmp website checked and verified. Fills and takes as prescribed   Other hyperlipidemia Continue lipitor 10mg  daily     Advance Care Planning:   Code Status: Full Code   Consults: PT   DVT Prophylaxis: TED hose   Family Communication: daughter at bedside: Autumn Messing   Severity of Illness: The appropriate patient status for this patient is OBSERVATION. Observation status is judged to be reasonable and necessary in order to provide the required intensity of service to ensure the patient's safety. The patient's presenting symptoms, physical exam findings, and initial radiographic and laboratory data in the context of their medical condition is felt to place them at decreased risk for further clinical deterioration. Furthermore, it is anticipated that the patient will be medically stable for discharge from the hospital within 2 midnights of admission.   Author: Orma Flaming, MD 04/23/2022 2:30 PM  For on call review [email or web address].

## 2022-04-23 NOTE — ED Notes (Signed)
EDP and daughter at [facility]. PT alert, NAD, calm, interactive, at baseline behavior/ mentation per daughter at [facility]. BP 120/49, HR 67, Baseline hgb 9, current hgb 8.2, PRBC held for now per Dr. Donnald Garre.

## 2022-04-23 NOTE — Assessment & Plan Note (Signed)
Baseline appears to be around 1.2-1.3 Stable, continue to monitor

## 2022-04-23 NOTE — ED Notes (Signed)
L medial ankle varicose vein w/o active bleeding. L ankle with swelling and possible bruising. Dried blood noted to BLE.

## 2022-04-23 NOTE — ED Notes (Signed)
Got patient undressed on the monitor patient is resting with nurse at bedside got patient some warm blankets

## 2022-04-23 NOTE — Assessment & Plan Note (Signed)
Appears euvolemic Last echo: 04/2021: EF of 50-55%. Grade 1 DD.  Hold entresto and toprol with soft blood pressure Strict I/O

## 2022-04-24 DIAGNOSIS — D649 Anemia, unspecified: Secondary | ICD-10-CM | POA: Diagnosis not present

## 2022-04-24 DIAGNOSIS — D62 Acute posthemorrhagic anemia: Secondary | ICD-10-CM | POA: Diagnosis not present

## 2022-04-24 LAB — CBC
HCT: 21.5 % — ABNORMAL LOW (ref 36.0–46.0)
HCT: 21.7 % — ABNORMAL LOW (ref 36.0–46.0)
Hemoglobin: 7.1 g/dL — ABNORMAL LOW (ref 12.0–15.0)
Hemoglobin: 7 g/dL — ABNORMAL LOW (ref 12.0–15.0)
MCH: 33.5 pg (ref 26.0–34.0)
MCH: 33.2 pg (ref 26.0–34.0)
MCHC: 33 g/dL (ref 30.0–36.0)
MCHC: 32.3 g/dL (ref 30.0–36.0)
MCV: 101.4 fL — ABNORMAL HIGH (ref 80.0–100.0)
MCV: 102.8 fL — ABNORMAL HIGH (ref 80.0–100.0)
Platelets: 131 10*3/uL — ABNORMAL LOW (ref 150–400)
Platelets: 128 10*3/uL — ABNORMAL LOW (ref 150–400)
RBC: 2.12 MIL/uL — ABNORMAL LOW (ref 3.87–5.11)
RBC: 2.11 MIL/uL — ABNORMAL LOW (ref 3.87–5.11)
RDW: 12.6 % (ref 11.5–15.5)
RDW: 12.5 % (ref 11.5–15.5)
WBC: 7.2 10*3/uL (ref 4.0–10.5)
WBC: 6.6 10*3/uL (ref 4.0–10.5)
nRBC: 0 % (ref 0.0–0.2)
nRBC: 0 % (ref 0.0–0.2)

## 2022-04-24 LAB — BASIC METABOLIC PANEL
Anion gap: 5 (ref 5–15)
BUN: 14 mg/dL (ref 8–23)
CO2: 24 mmol/L (ref 22–32)
Calcium: 8.8 mg/dL — ABNORMAL LOW (ref 8.9–10.3)
Chloride: 111 mmol/L (ref 98–111)
Creatinine, Ser: 0.94 mg/dL (ref 0.44–1.00)
GFR, Estimated: 59 mL/min — ABNORMAL LOW (ref >60)
Glucose, Bld: 90 mg/dL (ref 70–99)
Potassium: 4 mmol/L (ref 3.5–5.1)
Sodium: 140 mmol/L (ref 135–145)

## 2022-04-24 LAB — HEMOGLOBIN AND HEMATOCRIT, BLOOD
HCT: 27.4 % — ABNORMAL LOW (ref 36.0–46.0)
Hemoglobin: 9.2 g/dL — ABNORMAL LOW (ref 12.0–15.0)

## 2022-04-24 LAB — PREPARE RBC (CROSSMATCH)

## 2022-04-24 MED ORDER — SODIUM CHLORIDE 0.9% IV SOLUTION: Freq: Once | INTRAVENOUS | Status: AC

## 2022-04-24 NOTE — Care Management Obs Status (Signed)
MEDICARE OBSERVATION STATUS NOTIFICATION   Patient Details  Name: Anita Martinez MRN: [id number] Date of Birth: 1933/07/11   Medicare Observation Status Notification Given:  Yes    Tom-Johnson, Hershal Coria, RN 04/24/2022, 3:22 PM

## 2022-04-24 NOTE — Discharge Summary (Signed)
Physician Discharge Summary  Anita Martinez GQQ:[id number] DOB: 1933/05/23 DOA: 04/23/2022  PCP: [name]  Admit date: 04/23/2022  Discharge date: 04/24/2022  Admitted From:Home  Disposition:  Home  Recommendations for Outpatient Follow-up:  Follow up with PCP in 1-2 weeks Continue on medications as prior  Home Health: None  Equipment/Devices: None  Discharge Condition:Stable  CODE STATUS: Full  Diet recommendation: Heart Healthy  Brief/Interim Summary: Anita Martinez is a 88 y.o. female with medical history significant of HLD, chronic pain syndrome, CKD stage 3a, HFpEF who presented to ED with weakness. Her daughter went to check on her this AM and she was weak and then slid down to the floor after she opened up the door for her daughter. She had dried blood all over her feet. No blood around her, but her daughter went to the bathroom found the bathroom covered in blood. She has varicosities and appeared to have bled from this.  She presented for symptomatic anemia and has had hemoglobin down to seven-point with recent baseline near 9-10.  She received 1 unit PRBC transfusion with improvement in hemoglobin levels.  PT evaluation performed and patient is stable for discharge today with no other bleeding episodes or concerns noted throughout the course of this brief admission.  Continue home medications as prior.  Discharge Diagnoses:  Principal Problem:   Symptomatic anemia Active Problems:   Chronic kidney disease, stage 3a (HCC)   Chronic HFrEF (heart failure with reduced ejection fraction) (HCC)   Chronic back pain   Other hyperlipidemia  Principal discharge diagnosis: Acute symptomatic blood loss anemia secondary to bleeding varicosities in lower extremities.  Discharge Instructions  Discharge Instructions     Diet - low sodium heart healthy   Complete by: As directed    Increase activity slowly   Complete by: As directed       Allergies as of 04/24/2022        Reactions   Morphine And Related Other (See Comments)   Makes patient hyper and unable to sleep        Medication List     TAKE these medications    acetaminophen 500 MG tablet Commonly known as: TYLENOL Take 500-1,000 mg by mouth 3 (three) times daily as needed for headache.   atorvastatin 10 MG tablet Commonly known as: LIPITOR Take 10 mg by mouth at bedtime.   Entresto 49-51 MG Generic drug: sacubitril-valsartan TAKE 1 TABLET BY MOUTH TWICE DAILY   feeding supplement Liqd Take 237 mLs by mouth 2 (two) times daily between meals.   fish oil-omega-3 fatty acids 1000 MG capsule Take 1 g by mouth daily with breakfast.   HYDROcodone-acetaminophen 5-325 MG tablet Commonly known as: NORCO/VICODIN Take 1 tablet by mouth at bedtime as needed (pain).   metoprolol succinate 25 MG 24 hr tablet Commonly known as: TOPROL-XL TAKE 1 TABLET BY MOUTH EVERY MORNING   multivitamin with minerals Tabs tablet Take 1 tablet by mouth daily.        Follow-up Information     Varadarajan, Rupashree. Schedule an appointment as soon as possible for a visit in 1 week(s).                 Allergies  Allergen Reactions   Morphine And Related Other (See Comments)    Makes patient hyper and unable to sleep    Consultations: None   Procedures/Studies: DG Foot Complete Left  Result Date: 04/23/2022 CLINICAL DATA:  Fall, pain EXAM: LEFT FOOT - COMPLETE 3+  VIEW COMPARISON:  None Available. FINDINGS: Osteopenia. There is no evidence of fracture or dislocation. There is no evidence of arthropathy or other focal bone abnormality. Soft tissues are unremarkable. IMPRESSION: Osteopenia.  No displaced fracture or dislocation of the left foot. Electronically Signed   By: Jearld Lesch M.D.   On: 04/23/2022 12:17   DG Tibia/Fibula Left  Result Date: 04/23/2022 CLINICAL DATA:  Left ankle pain and bleeding. EXAM: LEFT TIBIA AND FIBULA - 2 VIEW COMPARISON:  None Available. FINDINGS: There  is no evidence of fracture or other focal bone lesions. No significant soft tissue swelling. IMPRESSION: Negative. Electronically Signed   By: Ted Mcalpine M.D.   On: 04/23/2022 12:16   DG Chest Port 1 View  Result Date: 04/23/2022 CLINICAL DATA:  Weakness. EXAM: PORTABLE CHEST 1 VIEW COMPARISON:  April 26, 2021 FINDINGS: Normal cardiac silhouette. Heavy calcific atherosclerotic disease and tortuosity of the aorta. Clear lungs.  No evidence of pleural effusion on this frontal view. IMPRESSION: 1. No active disease. 2. Heavy calcific atherosclerotic disease and tortuosity of the aorta. Electronically Signed   By: Ted Mcalpine M.D.   On: 04/23/2022 12:15     Discharge Exam: Vitals:   04/24/22 0508 04/24/22 0931  BP: (!) 102/48 (!) 111/46  Pulse: 72 64  Resp: 17 18  Temp: 99 F (37.2 C) 98.7 F (37.1 C)  SpO2: 98% 97%   Vitals:   04/23/22 1653 04/23/22 2044 04/24/22 0508 04/24/22 0931  BP: (!) 111/40 (!) 117/49 (!) 102/48 (!) 111/46  Pulse: 79 81 72 64  Resp: 17 18 17 18   Temp: 98.4 F (36.9 C) 97.7 F (36.5 C) 99 F (37.2 C) 98.7 F (37.1 C)  TempSrc: Oral Oral Oral Oral  SpO2: 100% 100% 98% 97%  Weight:      Height:        General: Pt is alert, awake, not in acute distress Cardiovascular: RRR, S1/S2 +, no rubs, no gallops Respiratory: CTA bilaterally, no wheezing, no rhonchi Abdominal: Soft, NT, ND, bowel sounds + Extremities: no edema, no cyanosis    The results of significant diagnostics from this hospitalization (including imaging, microbiology, ancillary and laboratory) are listed below for reference.     Microbiology: Recent Results (from the past 240 hour(s))  Resp Panel by RT-PCR (Flu A&B, Covid) Anterior Nasal Swab     Status: None   Collection Time: 04/23/22 11:49 AM   Specimen: Anterior Nasal Swab  Result Value Ref Range Status   SARS Coronavirus 2 by RT PCR NEGATIVE NEGATIVE Final    Comment: (NOTE) SARS-CoV-2 target nucleic acids are NOT  DETECTED.  The SARS-CoV-2 RNA is generally detectable in upper respiratory specimens during the acute phase of infection. The lowest concentration of SARS-CoV-2 viral copies this assay can detect is 138 copies/mL. A negative result does not preclude SARS-Cov-2 infection and should not be used as the sole basis for treatment or other patient management decisions. A negative result may occur with  improper specimen collection/handling, submission of specimen other than nasopharyngeal swab, presence of viral mutation(s) within the areas targeted by this assay, and inadequate number of viral copies(<138 copies/mL). A negative result must be combined with clinical observations, patient history, and epidemiological information. The expected result is Negative.  Fact Sheet for Patients:  06/23/22  Fact Sheet for Healthcare Providers:  BloggerCourse.com  This test is no t yet approved or cleared by the [employer] FDA and  has been authorized for detection and/or diagnosis of SARS-CoV-2 by  FDA under an Emergency Use Authorization (EUA). This EUA will remain  in effect (meaning this test can be used) for the duration of the COVID-19 declaration under Section 564(b)(1) of the Act, 21 U.S.C.section 360bbb-3(b)(1), unless the authorization is terminated  or revoked sooner.       Influenza A by PCR NEGATIVE NEGATIVE Final   Influenza B by PCR NEGATIVE NEGATIVE Final    Comment: (NOTE) The Xpert Xpress SARS-CoV-2/FLU/RSV plus assay is intended as an aid in the diagnosis of influenza from Nasopharyngeal swab specimens and should not be used as a sole basis for treatment. Nasal washings and aspirates are unacceptable for Xpert Xpress SARS-CoV-2/FLU/RSV testing.  Fact Sheet for Patients: EntrepreneurPulse.com.au  Fact Sheet for Healthcare Providers: IncredibleEmployment.be  This test is not yet  approved or cleared by the [place] FDA and has been authorized for detection and/or diagnosis of SARS-CoV-2 by FDA under an Emergency Use Authorization (EUA). This EUA will remain in effect (meaning this test can be used) for the duration of the COVID-19 declaration under Section 564(b)(1) of the Act, 21 U.S.C. section 360bbb-3(b)(1), unless the authorization is terminated or revoked.  Performed at [facility], [place] [address], [place], [place] [address]      Labs: BNP (last 3 results) Recent Labs    02/14/22 1040  BNP [id number]*   Basic Metabolic Panel: Recent Labs  Lab 04/23/22 1115 04/23/22 1205 04/23/22 1851 04/24/22 0426  NA 136 138  --  140  K 4.0 4.1  --  4.0  CL 106 104  --  111  CO2 24  --   --  24  GLUCOSE 104* 100*  --  90  BUN 19 20  --  14  CREATININE 1.26* 1.30*  --  0.94  CALCIUM 9.0  --   --  8.8*  MG  --   --  2.1  --    Liver Function Tests: Recent Labs  Lab 04/23/22 1115  AST 20  ALT 12  ALKPHOS 32*  BILITOT 0.7  PROT 6.2*  ALBUMIN 3.3*   No results for input(s): "LIPASE", "AMYLASE" in the last 168 hours. No results for input(s): "AMMONIA" in the last 168 hours. CBC: Recent Labs  Lab 04/23/22 1115 04/23/22 1205 04/23/22 1400 04/23/22 1851 04/24/22 0426 04/24/22 0713  WBC 9.9  --  11.3* 11.0* 7.2 6.6  NEUTROABS 7.0  --   --   --   --   --   HGB 8.2* 8.5* 7.9* 7.6* 7.1* 7.0*  HCT 25.6* 25.0* 25.2* 23.4* 21.5* 21.7*  MCV 103.2*  --  105.0* 102.2* 101.4* 102.8*  PLT 141*  --  141* 144* 131* 128*   Cardiac Enzymes: No results for input(s): "CKTOTAL", "CKMB", "CKMBINDEX", "TROPONINI" in the last 168 hours. BNP: Invalid input(s): "POCBNP" CBG: No results for input(s): "GLUCAP" in the last 168 hours. D-Dimer No results for input(s): "DDIMER" in the last 72 hours. Hgb A1c No results for input(s): "HGBA1C" in the last 72 hours. Lipid Profile No results for input(s): "CHOL", "HDL", "LDLCALC", "TRIG", "CHOLHDL", "LDLDIRECT"  in the last 72 hours. Thyroid function studies Recent Labs    04/23/22 1851  TSH 0.663   Anemia work up Recent Labs    04/23/22 1851  VITAMINB12 363  FOLATE >40.0  FERRITIN 168  TIBC 272  IRON 34   Urinalysis    Component Value Date/Time   COLORURINE YELLOW 04/23/2022 [place] 04/23/2022 1327   LABSPEC 1.012 04/23/2022 1327  PHURINE 5.0 04/23/2022 1327   GLUCOSEU NEGATIVE 04/23/2022 1327   HGBUR NEGATIVE 04/23/2022 1327   [place] 04/23/2022 1327   KETONESUR NEGATIVE 04/23/2022 1327   PROTEINUR NEGATIVE 04/23/2022 1327   NITRITE NEGATIVE 04/23/2022 1327   LEUKOCYTESUR NEGATIVE 04/23/2022 1327   Sepsis Labs Recent Labs  Lab 04/23/22 1400 04/23/22 1851 04/24/22 0426 04/24/22 0713  WBC 11.3* 11.0* 7.2 6.6   Microbiology Recent Results (from the past 240 hour(s))  Resp Panel by RT-PCR (Flu A&B, Covid) Anterior Nasal Swab     Status: None   Collection Time: 04/23/22 11:49 AM   Specimen: Anterior Nasal Swab  Result Value Ref Range Status   SARS Coronavirus 2 by RT PCR NEGATIVE NEGATIVE Final    Comment: (NOTE) SARS-CoV-2 target nucleic acids are NOT DETECTED.  The SARS-CoV-2 RNA is generally detectable in upper respiratory specimens during the acute phase of infection. The lowest concentration of SARS-CoV-2 viral copies this assay can detect is 138 copies/mL. A negative result does not preclude SARS-Cov-2 infection and should not be used as the sole basis for treatment or other patient management decisions. A negative result may occur with  improper specimen collection/handling, submission of specimen other than nasopharyngeal swab, presence of viral mutation(s) within the areas targeted by this assay, and inadequate number of viral copies(<138 copies/mL). A negative result must be combined with clinical observations, patient history, and epidemiological information. The expected result is Negative.  Fact Sheet for Patients:   EntrepreneurPulse.com.au  Fact Sheet for Healthcare Providers:  IncredibleEmployment.be  This test is no t yet approved or cleared by the [place] FDA and  has been authorized for detection and/or diagnosis of SARS-CoV-2 by FDA under an Emergency Use Authorization (EUA). This EUA will remain  in effect (meaning this test can be used) for the duration of the COVID-19 declaration under Section 564(b)(1) of the Act, 21 U.S.C.section 360bbb-3(b)(1), unless the authorization is terminated  or revoked sooner.       Influenza A by PCR NEGATIVE NEGATIVE Final   Influenza B by PCR NEGATIVE NEGATIVE Final    Comment: (NOTE) The Xpert Xpress SARS-CoV-2/FLU/RSV plus assay is intended as an aid in the diagnosis of influenza from Nasopharyngeal swab specimens and should not be used as a sole basis for treatment. Nasal washings and aspirates are unacceptable for Xpert Xpress SARS-CoV-2/FLU/RSV testing.  Fact Sheet for Patients: EntrepreneurPulse.com.au  Fact Sheet for Healthcare Providers: IncredibleEmployment.be  This test is not yet approved or cleared by the [place] FDA and has been authorized for detection and/or diagnosis of SARS-CoV-2 by FDA under an Emergency Use Authorization (EUA). This EUA will remain in effect (meaning this test can be used) for the duration of the COVID-19 declaration under Section 564(b)(1) of the Act, 21 U.S.C. section 360bbb-3(b)(1), unless the authorization is terminated or revoked.  Performed at [facility], [place] [address], [place], [place] [address]      Time coordinating discharge: 35 minutes  SIGNED:   Rodena Goldmann, DO [facility] 04/24/2022, 10:17 AM  If 7PM-7AM, please contact night-coverage [email or web address]

## 2022-04-24 NOTE — Evaluation (Signed)
Physical Therapy Evaluation and Discharge Patient Details Name: MADISYNN PLAIR MRN: [id number] DOB: [birth year] Today's Date: 04/24/2022  History of Present Illness  88 y.o. female who presented to ED with weakness. Had bleeding from leg varicosities and Hgb 8.1 on admission PMH significant of HLD, chronic pain syndrome, CKD stage 3a, HFpEF  Clinical Impression   Patient evaluated by Physical Therapy with no further acute PT needs identified. All education has been completed and the patient has no further questions.  PT is signing off. Thank you for this referral.        Recommendations for follow up therapy are one component of a multi-disciplinary discharge planning process, led by the attending physician.  Recommendations may be updated based on patient status, additional functional criteria and insurance authorization.  Follow Up Recommendations No PT follow up      Assistance Recommended at Discharge PRN  Patient can return home with the following  Assistance with cooking/housework;Direct supervision/assist for medications management;Direct supervision/assist for financial management    Equipment Recommendations None recommended by PT  Recommendations for Other Services       Functional Status Assessment Patient has not had a recent decline in their functional status     Precautions / Restrictions Precautions Precautions: Fall Precaution Comments: pt reports she falls daily--daughter denies      Mobility  Bed Mobility Overal bed mobility: Independent                  Transfers Overall transfer level: Independent Equipment used: None               General transfer comment: from bed, brom standard toilet    Ambulation/Gait Ambulation/Gait assistance: Independent Gait Distance (Feet): 180 Feet Assistive device: None Gait Pattern/deviations: WFL(Within Functional Limits)   Gait velocity interpretation: 1.31 - 2.62 ft/sec, indicative of limited  community ambulator   General Gait Details: able to make sudden stops, 180 degree turns, head turns without imbalance  Stairs            Wheelchair Mobility    Modified Rankin (Stroke Patients Only)       Balance Overall balance assessment: Independent                                           Pertinent Vitals/Pain Pain Assessment Pain Assessment: No/denies pain    Home Living Family/patient expects to be discharged to:: Private residence Living Arrangements: Alone Available Help at Discharge: Family;Available PRN/intermittently Type of Home: Mobile home Home Access: Stairs to enter Entrance Stairs-Rails: Right Entrance Stairs-Number of Steps: 4   Home Layout: One level Home Equipment: Rollator (4 wheels);Cane - single point;Shower seat;Hand held shower head Additional Comments: Daughter present to confirm information    Prior Function Prior Level of Function : Needs assist             Mobility Comments: denies using rollator or cane "because I"m lazy and don't want to take them with me" ADLs Comments: daughter assists with meds, groceries, driving to appts "whatever she needs"     Hand Dominance        Extremity/Trunk Assessment   Upper Extremity Assessment Upper Extremity Assessment: Generalized weakness    Lower Extremity Assessment Lower Extremity Assessment: Generalized weakness    Cervical / Trunk Assessment Cervical / Trunk Assessment: Kyphotic  Communication   Communication: HOH  Cognition Arousal/Alertness: Awake/alert Behavior During  Therapy: WFL for tasks assessed/performed Overall Cognitive Status: Difficult to assess                                 General Comments: when she hears the question, she answers appropriately        General Comments General comments (skin integrity, edema, etc.): Daughter reports (and pt then confirms) that pt is able to go outside to take out her trash and to get  her mail. Denies falls as pt initially reports.    Exercises     Assessment/Plan    PT Assessment Patient does not need any further PT services  PT Problem List         PT Treatment Interventions      PT Goals (Current goals can be found in the Care Plan section)  Acute Rehab PT Goals Patient Stated Goal: go home today PT Goal Formulation: All assessment and education complete, DC therapy    Frequency       Co-evaluation               AM-PAC PT "6 Clicks" Mobility  Outcome Measure Help needed turning from your back to your side while in a flat bed without using bedrails?: None Help needed moving from lying on your back to sitting on the side of a flat bed without using bedrails?: None Help needed moving to and from a bed to a chair (including a wheelchair)?: None Help needed standing up from a chair using your arms (e.g., wheelchair or bedside chair)?: None Help needed to walk in hospital room?: None Help needed climbing 3-5 steps with a railing? : None 6 Click Score: 24    End of Session Equipment Utilized During Treatment: Gait belt Activity Tolerance: Patient tolerated treatment well Patient left: in bed;with call bell/phone within reach;with bed alarm set;with family/visitor present Nurse Communication: Mobility status;Other (comment) (no PT needs) PT Visit Diagnosis: Difficulty in walking, not elsewhere classified (R26.2)    Time: 3790-2409 PT Time Calculation (min) (ACUTE ONLY): 22 min   Charges:   PT Evaluation $PT Eval Low Complexity: 1 Low           Jerolyn Center, PT Acute Rehabilitation Services  Office [phone]   Zena Amos 04/24/2022, 3:01 PM

## 2022-04-24 NOTE — Progress Notes (Signed)
PT AVS reviewed with daughter of pt. She verbalized understanding of all DC teaching and instructions. IV removed without complications. All belongings are in pt possession.

## 2022-04-24 NOTE — TOC Transition Note (Signed)
Transition of Care [facility]) - CM/SW Discharge Note   Patient Details  Name: Anita Martinez MRN: [id number] Date of Birth: [birth year]  [facility]) CM/SW Contact:  Tom-Johnson, Hershal Coria, RN Phone Number: 04/24/2022, 3:08 PM   Clinical Narrative:     Patient is scheduled for discharge today. From home alone. Has three adult children. Has a cane, shower seat and rollator at home.  No PT f/u noted. 1 unit PRBC given today for hgb of 7.0. Daughter to transport at discharge. No further TOC needs noted.   Final next level of care: Home/Self Care Barriers to Discharge: Barriers Resolved   Patient Goals and CMS Choice Patient states their goals for this hospitalization and ongoing recovery are:: To return home CMS Medicare.gov Compare Post Acute Care list provided to:: Patient Choice offered to / list presented to : NA  Discharge Placement                Patient to be transferred to facility by: Daughter      Discharge Plan and Services                DME Arranged: N/A DME Agency: NA       HH Arranged: NA HH Agency: NA        Social Determinants of Health (SDOH) Interventions     Readmission Risk Interventions    02/08/2020    1:31 PM  Readmission Risk Prevention Plan  Transportation Screening Complete  PCP or Specialist Appt within 5-7 Days Complete  Home Care Screening Complete  Medication Review (RN CM) Complete

## 2022-04-24 NOTE — Progress Notes (Signed)
PT Cancellation Note  Patient Details Name: SAFIYYAH VASCONEZ MRN: [id number] DOB: 08/15/1933   Cancelled Treatment:    Reason Eval/Treat Not Completed: Other (comment)  Noted plans for transfusion. Will plan to see after transfusion completed.    Jerolyn Center, PT Acute Rehabilitation Services  Office [phone]  Zena Amos 04/24/2022, 9:50 AM

## 2022-04-25 LAB — TYPE AND SCREEN
ABO/RH(D): O POS
Antibody Screen: NEGATIVE
Unit division: 0

## 2022-04-25 LAB — BPAM RBC
Blood Product Expiration Date: 202308312359
ISSUE DATE / TIME: 202308031042
Unit Type and Rh: 5100

## 2022-05-07 DIAGNOSIS — E46 Unspecified protein-calorie malnutrition: Secondary | ICD-10-CM | POA: Diagnosis not present

## 2022-05-07 DIAGNOSIS — N1831 Chronic kidney disease, stage 3a: Secondary | ICD-10-CM | POA: Diagnosis not present

## 2022-05-07 DIAGNOSIS — I428 Other cardiomyopathies: Secondary | ICD-10-CM | POA: Diagnosis not present

## 2022-05-07 DIAGNOSIS — I5020 Unspecified systolic (congestive) heart failure: Secondary | ICD-10-CM | POA: Diagnosis not present

## 2022-05-07 DIAGNOSIS — D500 Iron deficiency anemia secondary to blood loss (chronic): Secondary | ICD-10-CM | POA: Diagnosis not present

## 2022-05-07 DIAGNOSIS — G894 Chronic pain syndrome: Secondary | ICD-10-CM | POA: Diagnosis not present

## 2022-05-07 DIAGNOSIS — G43009 Migraine without aura, not intractable, without status migrainosus: Secondary | ICD-10-CM | POA: Diagnosis not present

## 2022-05-07 DIAGNOSIS — E785 Hyperlipidemia, unspecified: Secondary | ICD-10-CM | POA: Diagnosis not present

## 2022-05-07 DIAGNOSIS — I110 Hypertensive heart disease with heart failure: Secondary | ICD-10-CM | POA: Diagnosis not present

## 2022-05-07 DIAGNOSIS — I9589 Other hypotension: Secondary | ICD-10-CM | POA: Diagnosis not present

## 2022-05-07 DIAGNOSIS — E861 Hypovolemia: Secondary | ICD-10-CM | POA: Diagnosis not present

## 2022-05-07 DIAGNOSIS — Z0000 Encounter for general adult medical examination without abnormal findings: Secondary | ICD-10-CM | POA: Diagnosis not present

## 2022-05-14 ENCOUNTER — Other Ambulatory Visit: Payer: Self-pay | Admitting: Cardiology

## 2022-05-14 DIAGNOSIS — I5022 Chronic systolic (congestive) heart failure: Secondary | ICD-10-CM

## 2022-05-14 DIAGNOSIS — I429 Cardiomyopathy, unspecified: Secondary | ICD-10-CM

## 2022-05-28 DIAGNOSIS — Z79891 Long term (current) use of opiate analgesic: Secondary | ICD-10-CM | POA: Diagnosis not present

## 2022-05-28 DIAGNOSIS — G4700 Insomnia, unspecified: Secondary | ICD-10-CM | POA: Diagnosis not present

## 2022-05-28 DIAGNOSIS — I5032 Chronic diastolic (congestive) heart failure: Secondary | ICD-10-CM | POA: Diagnosis not present

## 2022-05-28 DIAGNOSIS — M961 Postlaminectomy syndrome, not elsewhere classified: Secondary | ICD-10-CM | POA: Diagnosis not present

## 2022-05-28 DIAGNOSIS — G894 Chronic pain syndrome: Secondary | ICD-10-CM | POA: Diagnosis not present

## 2022-05-29 LAB — PRO B NATRIURETIC PEPTIDE: NT-Pro BNP: 166 pg/mL (ref 0–738)

## 2022-05-29 LAB — BASIC METABOLIC PANEL
BUN/Creatinine Ratio: 14 (ref 12–28)
BUN: 16 mg/dL (ref 8–27)
CO2: 23 mmol/L (ref 20–29)
Calcium: 10.1 mg/dL (ref 8.7–10.3)
Chloride: 102 mmol/L (ref 96–106)
Creatinine, Ser: 1.16 mg/dL — ABNORMAL HIGH (ref 0.57–1.00)
Glucose: 92 mg/dL (ref 70–99)
Potassium: 4.2 mmol/L (ref 3.5–5.2)
Sodium: 143 mmol/L (ref 134–144)
eGFR: 46 mL/min/{1.73_m2} — ABNORMAL LOW (ref >59)

## 2022-05-29 LAB — MAGNESIUM: Magnesium: 2.9 mg/dL — ABNORMAL HIGH (ref 1.6–2.3)

## 2022-06-04 NOTE — Progress Notes (Signed)
Called and spoke to patient's daughter she voiced understanding

## 2022-06-10 ENCOUNTER — Ambulatory Visit: Payer: Medicaid Other | Admitting: Cardiology

## 2022-06-10 ENCOUNTER — Encounter: Payer: Self-pay | Admitting: Cardiology

## 2022-06-10 VITALS — BP 115/38 | HR 56 | Temp 97.0°F | Resp 16 | Ht 65.0 in | Wt 112.2 lb

## 2022-06-10 DIAGNOSIS — Z87891 Personal history of nicotine dependence: Secondary | ICD-10-CM

## 2022-06-10 DIAGNOSIS — E782 Mixed hyperlipidemia: Secondary | ICD-10-CM | POA: Diagnosis not present

## 2022-06-10 DIAGNOSIS — I5032 Chronic diastolic (congestive) heart failure: Secondary | ICD-10-CM

## 2022-06-10 MED ORDER — ENTRESTO 24-26 MG PO TABS: 1.0000 | ORAL_TABLET | Freq: Two times a day (BID) | ORAL | 0 refills | Status: DC

## 2022-06-10 NOTE — Progress Notes (Signed)
Anita Martinez Date of Birth: 1933-08-22 MRN: [id number] [place]  Date: 06/10/22 Last Office Visit: 12/09/2021  Chief Complaint  Patient presents with   heart failure management   Follow-up    6 month follow up    HPI  Anita Martinez is a 88 y.o.  female whose past medical history and cardiovascular risk factors include: Chronic HFpEF stage C, NYHA class II, recovered cardiomyopathy, mixed hyperlipidemia, former smoker, advanced age, postmenopausal female.  Patient is accompanied by her daughter, Jackelyn Poling ([phone]), at today's office visit.    In May 2021 patient was diagnosed with biventricular heart failure and acute decompensation and admitted to [facility].  Due to advanced age and multiple comorbidities including frailty family refused any invasive cardiovascular testing and wanted her treated medically.  She has done well with up titration of GDMT and repeat echocardiogram has also noted improvement in LVEF.  She continues to be treated for chronic HFpEF.  She now presents for 57-month follow-up visit.  In the last 6 months she denies anginal discomfort or heart failure symptoms.  No hospitalizations or urgent care visits for cardiovascular symptoms.  Her daughter Jackelyn Poling keeps a very close eye on the blood pressures and weight regularly.  She has not been taking her Entresto at least 5 days out of the last 19 days of September due to soft blood pressures and has held 6 days of evening doses as well.  She has lost approximately 19 pounds since last office visit.   ALLERGIES: Allergies  Allergen Reactions   Morphine And Related Other (See Comments)    Makes patient hyper and unable to sleep    MEDICATION LIST PRIOR TO VISIT: Current Outpatient Medications on File Prior to Visit  Medication Sig Dispense Refill   acetaminophen (TYLENOL) 500 MG tablet Take 500-1,000 mg by mouth 3 (three) times daily as needed for headache.      atorvastatin (LIPITOR) 10 MG tablet Take 10 mg by mouth at bedtime.     feeding supplement, ENSURE ENLIVE, (ENSURE ENLIVE) LIQD Take 237 mLs by mouth 2 (two) times daily between meals. 237 mL 12   fish oil-omega-3 fatty acids 1000 MG capsule Take 1 g by mouth daily with breakfast.     HYDROcodone-acetaminophen (NORCO/VICODIN) 5-325 MG tablet Take 1 tablet by mouth at bedtime as needed (pain).     metoprolol succinate (TOPROL-XL) 25 MG 24 hr tablet TAKE 1 TABLET BY MOUTH EVERY MORNING 90 tablet 0   Multiple Vitamin (MULTIVITAMIN WITH MINERALS) TABS tablet Take 1 tablet by mouth daily. 30 tablet 1   No current facility-administered medications on file prior to visit.    PAST MEDICAL HISTORY: Past Medical History:  Diagnosis Date   Cardiomyopathy ([place])    CHF (congestive heart failure) (HCC)    Chronic back pain    Colon polyps    Colonic diverticular abscess    Hyperlipidemia    Hypertension    MR (mitral regurgitation)    TR (tricuspid regurgitation)     PAST SURGICAL HISTORY: Past Surgical History:  Procedure Laterality Date   ABDOMINAL HYSTERECTOMY     BLADDER SUSPENSION      FAMILY HISTORY: No family history of premature coronary disease or sudden cardiac death.   SOCIAL HISTORY:  The patient  reports that she quit smoking about 53 years ago. Her smoking use included cigarettes. She started smoking about 67 years ago. She has a 14.00 pack-year smoking history. She has never used smokeless  tobacco. She reports that she does not drink alcohol and does not use drugs.  Review of Systems  Constitutional: Positive for weight loss. Negative for chills, fever, malaise/fatigue and weight gain.  HENT:  Positive for hearing loss and tinnitus (chronic and stable.). Negative for hoarse voice and nosebleeds.   Eyes:  Negative for discharge, double vision and pain.  Cardiovascular:  Negative for chest pain, claudication, dyspnea on exertion, leg swelling, near-syncope, orthopnea,  palpitations, paroxysmal nocturnal dyspnea and syncope.  Respiratory:  Negative for hemoptysis and shortness of breath.   Musculoskeletal:  Negative for muscle cramps and myalgias.  Gastrointestinal:  Negative for abdominal pain, constipation, diarrhea, hematemesis, hematochezia, melena, nausea and vomiting.  Neurological:  Negative for dizziness and light-headedness.    PHYSICAL EXAM:    06/10/2022    9:50 AM 04/24/2022    1:25 PM 04/24/2022   11:01 AM  Vitals with BMI  Height 5\' 5"     Weight 112 lbs 3 oz    BMI 0000000    Systolic [id number]  Diastolic 38 65 50  Pulse 56 75 86    CONSTITUTIONAL: Frail-appearing elderly female.  No acute distress, hemodynamically stable.    SKIN: Skin is warm and dry. No rash noted. No cyanosis. No pallor. No jaundice HEAD: Normocephalic and atraumatic.  EYES: No scleral icterus MOUTH/THROAT: Moist oral membranes.  NECK: No JVD present. No thyromegaly noted. No carotid bruits  CHEST Normal respiratory effort. No intercostal retractions  LUNGS: Clear to auscultation bilaterally.  No stridor. No wheezes. No rales.  CARDIOVASCULAR: Regular, positive Q000111Q, holosystolic murmur heard at the apex, no gallops or rubs ABDOMINAL: No apparent ascites, abdominal hernia.  EXTREMITIES: No peripheral edema, warm to touch, varicose veins present bilaterally. HEMATOLOGIC: No significant bruising. NEUROLOGIC: Oriented to person, place, and time. Nonfocal. Normal muscle tone.  PSYCHIATRIC: Normal mood and affect. Normal behavior. Cooperative No significant change since last office visit.  CARDIAC DATABASE: EKG: 06/10/2022: Sinus bradycardia, 54 bpm, consider old anteroseptal  infarct, without underlying ischemia or injury pattern.  Echocardiogram: 04/24/2021:  1. Left ventricular ejection fraction, by estimation, is 50 to 55%. The  left ventricle has low normal function. The left ventricle has no regional  wall motion abnormalities. Left ventricular diastolic  parameters are  consistent with Grade I diastolic  dysfunction (impaired relaxation).   2. Right ventricular systolic function is low normal. The right  ventricular size is upper limit of normal. There is normal pulmonary  artery systolic pressure.   3. Left atrial size was mildly dilated.   4. The mitral valve is degenerative. Mild mitral valve regurgitation.   5. The aortic valve is tricuspid. Aortic valve regurgitation is not  visualized. Mild aortic valve sclerosis is present, with no evidence of  aortic valve stenosis.   6. The inferior vena cava is normal in size with greater than 50%  respiratory variability, suggesting right atrial pressure of 3 mmHg.   Comparison(s): Prior study 05/07/2020: LVEF 20-25%, severe global  hypokinesis, Grade 3 DD, elevated LAP, LAE severely dilated, Moderate MR,  RAP normal.   LABORATORY DATA:    Latest Ref Rng & Units 04/24/2022    3:11 PM 04/24/2022    7:13 AM 04/24/2022    4:26 AM  CBC  WBC 4.0 - 10.5 K/uL  6.6  7.2   Hemoglobin 12.0 - 15.0 g/dL 9.2  7.0  7.1   Hematocrit 36.0 - 46.0 % 27.4  21.7  21.5   Platelets 150 - 400 K/uL  128  131        Latest Ref Rng & Units 05/28/2022    9:50 AM 04/24/2022    4:26 AM 04/23/2022   12:05 PM  CMP  Glucose 70 - 99 mg/dL 92  90  100   BUN 8 - 27 mg/dL 16  14  20    Creatinine 0.57 - 1.00 mg/dL 1.16  0.94  1.30   Sodium 134 - 144 mmol/L 143  140  138   Potassium 3.5 - 5.2 mmol/L 4.2  4.0  4.1   Chloride 96 - 106 mmol/L 102  111  104   CO2 20 - 29 mmol/L 23  24    Calcium 8.7 - 10.3 mg/dL 10.1  8.8      Lipid Panel  Lab Results  Component Value Date   CHOL 169 03/05/2021   HDL 43 03/05/2021   LDLCALC 91 03/05/2021   LDLDIRECT 78 03/05/2021   TRIG 204 (H) 03/05/2021    No results found for: "HGBA1C" No components found for: "NTPROBNP" Lab Results  Component Value Date   TSH 0.663 04/23/2022   TSH 0.841 02/14/2022   TSH 1.554 01/30/2020    Cardiac Panel (last 3 results) No results for  input(s): "CKTOTAL", "CKMB", "TROPONINIHS", "RELINDX" in the last 72 hours.  IMPRESSION:    ICD-10-CM   1. Chronic heart failure with preserved ejection fraction (HFpEF) (HCC)  I50.32 EKG 12-Lead    sacubitril-valsartan (ENTRESTO) 24-26 MG    2. Mixed hyperlipidemia  E78.2     3. Former smoker  Z87.891        RECOMMENDATIONS: Anita Martinez is a 88 y.o. female whose past medical history and cardiovascular risk factors include: Chronic HFpEF stage C, NYHA class II, recovered cardiomyopathy, mixed hyperlipidemia, former smoker, advanced age, postmenopausal female.  Chronic heart failure with preserved ejection fraction (HFpEF) (HCC) /recovered cardiomyopathy Euvolemic on physical examination. History of biventricular heart failure during her hospitalization in May 2021 (LVEF 20-25% with grade 3 diastolic dysfunction). With uptitration of maximally tolerated GDMT her LVEF is improved as of August 2022 LVEF is 50-55% and grade 1 diastolic impairment. No hospitalizations for heart failure since last office visit. She has lost approximately 19 pounds over the last 6 months (daughter states that she got rid of her holiday weight) according to them has had age-appropriate cancer screening.  I suspect that due to her weight loss her blood pressures at home have been running soft. They have been holding Entresto if the SBP is less than 100 mmHg. We will discontinue Entresto 49/51 mg p.o. twice daily. Start Entresto 24/26 mg p.o. twice daily. EKG illustrates sinus bradycardia without underlying injury pattern. Reemphasized importance of daily weights, strict I's and O's, and reducing salt to <2 g/day  Mixed hyperlipidemia Continue atorvastatin. Does not endorse myalgias. Currently managed by primary care provider.  Recent labs from May 28, 2022 independently reviewed and noted above for further reference.   FINAL MEDICATION LIST END OF ENCOUNTER: Medications Discontinued During This  Encounter  Medication Reason   ENTRESTO 49-51 MG Dose change     Current Outpatient Medications:    acetaminophen (TYLENOL) 500 MG tablet, Take 500-1,000 mg by mouth 3 (three) times daily as needed for headache., Disp: , Rfl:    atorvastatin (LIPITOR) 10 MG tablet, Take 10 mg by mouth at bedtime., Disp: , Rfl:    feeding supplement, ENSURE ENLIVE, (ENSURE ENLIVE) LIQD, Take 237 mLs by mouth 2 (two) times daily between meals., Disp: 237  mL, Rfl: 12   fish oil-omega-3 fatty acids 1000 MG capsule, Take 1 g by mouth daily with breakfast., Disp: , Rfl:    HYDROcodone-acetaminophen (NORCO/VICODIN) 5-325 MG tablet, Take 1 tablet by mouth at bedtime as needed (pain)., Disp: , Rfl:    metoprolol succinate (TOPROL-XL) 25 MG 24 hr tablet, TAKE 1 TABLET BY MOUTH EVERY MORNING, Disp: 90 tablet, Rfl: 0   Multiple Vitamin (MULTIVITAMIN WITH MINERALS) TABS tablet, Take 1 tablet by mouth daily., Disp: 30 tablet, Rfl: 1   sacubitril-valsartan (ENTRESTO) 24-26 MG, Take 1 tablet by mouth 2 (two) times daily., Disp: 180 tablet, Rfl: 0  Orders Placed This Encounter  Procedures   EKG 12-Lead   --Continue cardiac medications as reconciled in final medication list. --Return in about 6 months (around 12/09/2022) for Follow up, heart failure management.. Or sooner if needed. --Continue follow-up with your primary care physician regarding the management of your other chronic comorbid conditions.  Patient's questions and concerns were addressed to her satisfaction. She voices understanding of the instructions provided during this encounter.   This note was created using a voice recognition software as a result there may be grammatical errors inadvertently enclosed that do not reflect the nature of this encounter. Every attempt is made to correct such errors.  Rex Kras, [place], [facility]  Pager: [phone] Office: [phone]

## 2022-08-26 ENCOUNTER — Other Ambulatory Visit: Payer: Self-pay

## 2022-08-26 DIAGNOSIS — I5032 Chronic diastolic (congestive) heart failure: Secondary | ICD-10-CM

## 2022-08-26 DIAGNOSIS — I5022 Chronic systolic (congestive) heart failure: Secondary | ICD-10-CM

## 2022-08-26 DIAGNOSIS — I429 Cardiomyopathy, unspecified: Secondary | ICD-10-CM

## 2022-08-26 MED ORDER — ENTRESTO 24-26 MG PO TABS: 1.0000 | ORAL_TABLET | Freq: Two times a day (BID) | ORAL | 0 refills | Status: DC

## 2022-08-26 MED ORDER — METOPROLOL SUCCINATE ER 25 MG PO TB24: 25.0000 mg | ORAL_TABLET | Freq: Every morning | ORAL | 0 refills | Status: DC

## 2022-09-03 DIAGNOSIS — D500 Iron deficiency anemia secondary to blood loss (chronic): Secondary | ICD-10-CM | POA: Diagnosis not present

## 2022-09-03 DIAGNOSIS — M961 Postlaminectomy syndrome, not elsewhere classified: Secondary | ICD-10-CM | POA: Diagnosis not present

## 2022-09-03 DIAGNOSIS — Z79891 Long term (current) use of opiate analgesic: Secondary | ICD-10-CM | POA: Diagnosis not present

## 2022-09-03 DIAGNOSIS — I700 Atherosclerosis of aorta: Secondary | ICD-10-CM | POA: Diagnosis not present

## 2022-09-03 DIAGNOSIS — G4700 Insomnia, unspecified: Secondary | ICD-10-CM | POA: Diagnosis not present

## 2022-09-03 DIAGNOSIS — I5020 Unspecified systolic (congestive) heart failure: Secondary | ICD-10-CM | POA: Diagnosis not present

## 2022-09-03 DIAGNOSIS — E46 Unspecified protein-calorie malnutrition: Secondary | ICD-10-CM | POA: Diagnosis not present

## 2022-09-03 DIAGNOSIS — G894 Chronic pain syndrome: Secondary | ICD-10-CM | POA: Diagnosis not present

## 2022-09-03 DIAGNOSIS — N1831 Chronic kidney disease, stage 3a: Secondary | ICD-10-CM | POA: Diagnosis not present

## 2022-09-03 DIAGNOSIS — E785 Hyperlipidemia, unspecified: Secondary | ICD-10-CM | POA: Diagnosis not present

## 2022-11-06 DIAGNOSIS — I110 Hypertensive heart disease with heart failure: Secondary | ICD-10-CM | POA: Diagnosis not present

## 2022-11-06 DIAGNOSIS — E785 Hyperlipidemia, unspecified: Secondary | ICD-10-CM | POA: Diagnosis not present

## 2022-11-06 DIAGNOSIS — N1831 Chronic kidney disease, stage 3a: Secondary | ICD-10-CM | POA: Diagnosis not present

## 2022-11-06 DIAGNOSIS — G8929 Other chronic pain: Secondary | ICD-10-CM | POA: Diagnosis not present

## 2022-11-24 ENCOUNTER — Other Ambulatory Visit: Payer: Self-pay | Admitting: Cardiology

## 2022-11-24 DIAGNOSIS — I5032 Chronic diastolic (congestive) heart failure: Secondary | ICD-10-CM

## 2022-11-24 DIAGNOSIS — I5022 Chronic systolic (congestive) heart failure: Secondary | ICD-10-CM

## 2022-11-24 DIAGNOSIS — I429 Cardiomyopathy, unspecified: Secondary | ICD-10-CM

## 2022-12-09 ENCOUNTER — Other Ambulatory Visit: Payer: Self-pay | Admitting: Cardiology

## 2022-12-09 ENCOUNTER — Ambulatory Visit: Payer: 59 | Admitting: Cardiology

## 2022-12-09 ENCOUNTER — Encounter: Payer: Self-pay | Admitting: Cardiology

## 2022-12-09 VITALS — BP 132/73 | HR 66 | Resp 16 | Ht 65.0 in | Wt 127.8 lb

## 2022-12-09 DIAGNOSIS — Z87891 Personal history of nicotine dependence: Secondary | ICD-10-CM | POA: Diagnosis not present

## 2022-12-09 DIAGNOSIS — G894 Chronic pain syndrome: Secondary | ICD-10-CM | POA: Diagnosis not present

## 2022-12-09 DIAGNOSIS — E782 Mixed hyperlipidemia: Secondary | ICD-10-CM | POA: Diagnosis not present

## 2022-12-09 DIAGNOSIS — I5032 Chronic diastolic (congestive) heart failure: Secondary | ICD-10-CM

## 2022-12-09 DIAGNOSIS — G4700 Insomnia, unspecified: Secondary | ICD-10-CM | POA: Diagnosis not present

## 2022-12-09 DIAGNOSIS — Z79891 Long term (current) use of opiate analgesic: Secondary | ICD-10-CM | POA: Diagnosis not present

## 2022-12-09 DIAGNOSIS — M961 Postlaminectomy syndrome, not elsewhere classified: Secondary | ICD-10-CM | POA: Diagnosis not present

## 2022-12-09 MED ORDER — TORSEMIDE 10 MG PO TABS: 10.0000 mg | ORAL_TABLET | Freq: Every day | ORAL | 0 refills | Status: DC | PRN

## 2022-12-09 NOTE — Progress Notes (Signed)
Anita Martinez Date of Birth: 07/24/1933 MRN: [id number] [place]  Date: 12/09/22 Last Office Visit: 06/10/2022  Chief Complaint  Patient presents with   Congestive Heart Failure   Hyperlipidemia   Follow-up    6 months    HPI  Anita Martinez is a 89 y.o.  female whose past medical history and cardiovascular risk factors include: Chronic HFpEF stage C, NYHA class II, recovered cardiomyopathy, mixed hyperlipidemia, former smoker, advanced age, postmenopausal female.  Patient is accompanied by her daughter, Jackelyn Poling ([phone]), at today's office visit.    In May 2021 she was diagnosed with biventricular heart failure with acute decompensation and admitted to the hospital.  Due to advanced age and multiple comorbidities including frailty family did not want to proceed with invasive diagnostic testing and favored medical therapy.  With uptitration of GDMT hemodynamically she has improved and her repeat echocardiogram has noted improvement in LVEF as well.  She presents today for 16-month follow-up visit.  Denies anginal discomfort or heart failure symptoms.  Her weights and BP remain stable.  No hospitalizations or urgent care visits for cardiovascular reasons.   ALLERGIES: Allergies  Allergen Reactions   Morphine And Related Other (See Comments)    Makes patient hyper and unable to sleep    MEDICATION LIST PRIOR TO VISIT: Current Outpatient Medications on File Prior to Visit  Medication Sig Dispense Refill   acetaminophen (TYLENOL) 500 MG tablet Take 500-1,000 mg by mouth 3 (three) times daily as needed for headache.     atorvastatin (LIPITOR) 10 MG tablet Take 10 mg by mouth at bedtime.     ENTRESTO 24-26 MG TAKE 1 TABLET BY MOUTH TWICE DAILY 180 tablet 0   feeding supplement, ENSURE ENLIVE, (ENSURE ENLIVE) LIQD Take 237 mLs by mouth 2 (two) times daily between meals. 237 mL 12   fish oil-omega-3 fatty acids 1000 MG capsule Take 1 g by  mouth daily with breakfast.     HYDROcodone-acetaminophen (NORCO/VICODIN) 5-325 MG tablet Take 1 tablet by mouth at bedtime as needed (pain).     metoprolol succinate (TOPROL-XL) 25 MG 24 hr tablet TAKE 1 TABLET(25 MG) BY MOUTH EVERY MORNING 90 tablet 0   Multiple Vitamin (MULTIVITAMIN WITH MINERALS) TABS tablet Take 1 tablet by mouth daily. 30 tablet 1   No current facility-administered medications on file prior to visit.    PAST MEDICAL HISTORY: Past Medical History:  Diagnosis Date   Cardiomyopathy ([place])    CHF (congestive heart failure) (HCC)    Chronic back pain    Colon polyps    Colonic diverticular abscess    Hyperlipidemia    Hypertension    MR (mitral regurgitation)    TR (tricuspid regurgitation)     PAST SURGICAL HISTORY: Past Surgical History:  Procedure Laterality Date   ABDOMINAL HYSTERECTOMY     BLADDER SUSPENSION      FAMILY HISTORY: No family history of premature coronary disease or sudden cardiac death.   SOCIAL HISTORY:  The patient  reports that she quit smoking about 54 years ago. Her smoking use included cigarettes. She started smoking about 68 years ago. She has a 14.00 pack-year smoking history. She has never used smokeless tobacco. She reports that she does not drink alcohol and does not use drugs.  Review of Systems  Constitutional: Positive for weight gain. Negative for chills, fever and malaise/fatigue.  HENT:  Positive for hearing loss and tinnitus (chronic and stable.). Negative for hoarse voice and nosebleeds.  Eyes:  Negative for discharge, double vision and pain.  Cardiovascular:  Negative for chest pain, claudication, dyspnea on exertion, leg swelling, near-syncope, orthopnea, palpitations, paroxysmal nocturnal dyspnea and syncope.  Respiratory:  Negative for hemoptysis and shortness of breath.   Musculoskeletal:  Positive for back pain. Negative for muscle cramps and myalgias.  Gastrointestinal:  Negative for abdominal pain, constipation,  diarrhea, hematemesis, hematochezia, melena, nausea and vomiting.  Neurological:  Negative for dizziness and light-headedness.    PHYSICAL EXAM:    12/09/2022   10:09 AM 06/10/2022    9:50 AM 04/24/2022    1:25 PM  Vitals with BMI  Height 5\' 5"  5\' 5"    Weight 127 lbs 13 oz 112 lbs 3 oz   BMI Q000111Q 0000000   Systolic Q000111Q [id number]  Diastolic 73 38 65  Pulse 66 56 75    Physical Exam  Constitutional: She appears cachectic. No distress. She appears chronically ill.  Hemodynamically stable.   Neck: No JVD present.  Cardiovascular: Normal rate, regular rhythm, S1 normal, S2 normal, intact distal pulses and normal pulses. Exam reveals no gallop, no S3 and no S4.  No murmur heard. Pulmonary/Chest: Effort normal and breath sounds normal. No stridor. She has no wheezes. She has no rales.  Abdominal: Soft. Bowel sounds are normal. She exhibits no distension. There is no abdominal tenderness.  Musculoskeletal:        General: No edema.     Cervical back: Neck supple.     Comments: varicose veins present bilaterally  Neurological: She is alert and oriented to person, place, and time. She has intact cranial nerves (2-12).  Skin: Skin is warm and moist.   CARDIAC DATABASE: EKG: 12/09/2022: Sinus rhythm, 64 bpm, poor R wave progression, without underlying ischemia or injury pattern.  Echocardiogram: 04/24/2021:  1. Left ventricular ejection fraction, by estimation, is 50 to 55%. The  left ventricle has low normal function. The left ventricle has no regional  wall motion abnormalities. Left ventricular diastolic parameters are  consistent with Grade I diastolic  dysfunction (impaired relaxation).   2. Right ventricular systolic function is low normal. The right  ventricular size is upper limit of normal. There is normal pulmonary  artery systolic pressure.   3. Left atrial size was mildly dilated.   4. The mitral valve is degenerative. Mild mitral valve regurgitation.   5. The aortic valve is  tricuspid. Aortic valve regurgitation is not  visualized. Mild aortic valve sclerosis is present, with no evidence of  aortic valve stenosis.   6. The inferior vena cava is normal in size with greater than 50%  respiratory variability, suggesting right atrial pressure of 3 mmHg.   Comparison(s): Prior study 05/07/2020: LVEF 20-25%, severe global  hypokinesis, Grade 3 DD, elevated LAP, LAE severely dilated, Moderate MR,  RAP normal.   LABORATORY DATA:    Latest Ref Rng & Units 04/24/2022    3:11 PM 04/24/2022    7:13 AM 04/24/2022    4:26 AM  CBC  WBC 4.0 - 10.5 K/uL  6.6  7.2   Hemoglobin 12.0 - 15.0 g/dL 9.2  7.0  7.1   Hematocrit 36.0 - 46.0 % 27.4  21.7  21.5   Platelets 150 - 400 K/uL  128  131        Latest Ref Rng & Units 05/28/2022    9:50 AM 04/24/2022    4:26 AM 04/23/2022   12:05 PM  CMP  Glucose 70 - 99 mg/dL 92  90  100   BUN 8 - 27 mg/dL 16  14  20    Creatinine 0.57 - 1.00 mg/dL 1.16  0.94  1.30   Sodium 134 - 144 mmol/L 143  140  138   Potassium 3.5 - 5.2 mmol/L 4.2  4.0  4.1   Chloride 96 - 106 mmol/L 102  111  104   CO2 20 - 29 mmol/L 23  24    Calcium 8.7 - 10.3 mg/dL 10.1  8.8      Lipid Panel  Lab Results  Component Value Date   CHOL 169 03/05/2021   HDL 43 03/05/2021   LDLCALC 91 03/05/2021   LDLDIRECT 78 03/05/2021   TRIG 204 (H) 03/05/2021    No results found for: "HGBA1C" No components found for: "NTPROBNP" Lab Results  Component Value Date   TSH 0.663 04/23/2022   TSH 0.841 02/14/2022   TSH 1.554 01/30/2020    Cardiac Panel (last 3 results) No results for input(s): "CKTOTAL", "CKMB", "TROPONINIHS", "RELINDX" in the last 72 hours.  IMPRESSION:    ICD-10-CM   1. Chronic heart failure with preserved ejection fraction (HFpEF) (HCC)  I50.32 EKG 12-Lead    PCV ECHOCARDIOGRAM COMPLETE    Pro b natriuretic peptide (BNP)    Basic metabolic panel    Magnesium    DISCONTINUED: torsemide (DEMADEX) 10 MG tablet    2. Mixed hyperlipidemia  E78.2      3. Former smoker  Z87.891        RECOMMENDATIONS: Anita Martinez is a 89 y.o. female whose past medical history and cardiovascular risk factors include: Chronic HFpEF stage C, NYHA class II, recovered cardiomyopathy, mixed hyperlipidemia, former smoker, advanced age, postmenopausal female.  Chronic heart failure with preserved ejection fraction (HFpEF) (HCC) Recovered cardiomyopathy Stage C, NYHA class II History of biventricular heart failure during her hospitalization in May 2021 (LVEF 20-25% with grade 3 diastolic dysfunction). With uptitration of maximally tolerated GDMT her LVEF is improved as of August 2022 LVEF is 50-55% and grade 1 diastolic impairment. No hospitalizations for heart failure since last office visit. She has gained 15 pounds as last office visit likely secondary to increased caloric intake Medications reconciled. Daughter provides weight and blood pressure log -independently reviewed. Reemphasized importance of daily weights, strict I's and O's, and reducing salt to <2 g/day Recheck labs prior to next visit.   Echo will be ordered to evaluate for structural heart disease and left ventricular systolic function. Will send a prescription for torsemide 10 mg p.o. daily as needed basis.  Mixed hyperlipidemia Continue atorvastatin. Does not endorse myalgias. Currently managed by primary care provider.  FINAL MEDICATION LIST END OF ENCOUNTER: There are no discontinued medications.    Current Outpatient Medications:    acetaminophen (TYLENOL) 500 MG tablet, Take 500-1,000 mg by mouth 3 (three) times daily as needed for headache., Disp: , Rfl:    atorvastatin (LIPITOR) 10 MG tablet, Take 10 mg by mouth at bedtime., Disp: , Rfl:    ENTRESTO 24-26 MG, TAKE 1 TABLET BY MOUTH TWICE DAILY, Disp: 180 tablet, Rfl: 0   feeding supplement, ENSURE ENLIVE, (ENSURE ENLIVE) LIQD, Take 237 mLs by mouth 2 (two) times daily between meals., Disp: 237 mL, Rfl: 12   fish  oil-omega-3 fatty acids 1000 MG capsule, Take 1 g by mouth daily with breakfast., Disp: , Rfl:    HYDROcodone-acetaminophen (NORCO/VICODIN) 5-325 MG tablet, Take 1 tablet by mouth at bedtime as needed (pain)., Disp: , Rfl:    metoprolol succinate (  TOPROL-XL) 25 MG 24 hr tablet, TAKE 1 TABLET(25 MG) BY MOUTH EVERY MORNING, Disp: 90 tablet, Rfl: 0   Multiple Vitamin (MULTIVITAMIN WITH MINERALS) TABS tablet, Take 1 tablet by mouth daily., Disp: 30 tablet, Rfl: 1   torsemide (DEMADEX) 10 MG tablet, TAKE 1 TABLET BY MOUTH DAILY AS NEEDED. USE IF WEIGHT GAIN MORE THAN 1 POUND OVER 1 DAY OR 3 POUNDS OVER 7 DAYS, Disp: 90 tablet, Rfl: 3  Orders Placed This Encounter  Procedures   Pro b natriuretic peptide (BNP)   Basic metabolic panel   Magnesium   EKG 12-Lead   PCV ECHOCARDIOGRAM COMPLETE   --Continue cardiac medications as reconciled in final medication list. --Return in about 6 months (around 06/11/2023) for Follow up, heart failure management.. Or sooner if needed. --Continue follow-up with your primary care physician regarding the management of your other chronic comorbid conditions.  Patient's questions and concerns were addressed to her satisfaction. She voices understanding of the instructions provided during this encounter.   This note was created using a voice recognition software as a result there may be grammatical errors inadvertently enclosed that do not reflect the nature of this encounter. Every attempt is made to correct such errors.  Rex Kras, [place], [facility]  Pager:  [phone] Office: [phone]

## 2022-12-24 DIAGNOSIS — I700 Atherosclerosis of aorta: Secondary | ICD-10-CM | POA: Diagnosis not present

## 2022-12-24 DIAGNOSIS — I428 Other cardiomyopathies: Secondary | ICD-10-CM | POA: Diagnosis not present

## 2022-12-24 DIAGNOSIS — R300 Dysuria: Secondary | ICD-10-CM | POA: Diagnosis not present

## 2022-12-24 DIAGNOSIS — E46 Unspecified protein-calorie malnutrition: Secondary | ICD-10-CM | POA: Diagnosis not present

## 2022-12-24 DIAGNOSIS — I130 Hypertensive heart and chronic kidney disease with heart failure and stage 1 through stage 4 chronic kidney disease, or unspecified chronic kidney disease: Secondary | ICD-10-CM | POA: Diagnosis not present

## 2022-12-24 DIAGNOSIS — I5020 Unspecified systolic (congestive) heart failure: Secondary | ICD-10-CM | POA: Diagnosis not present

## 2022-12-24 DIAGNOSIS — N1831 Chronic kidney disease, stage 3a: Secondary | ICD-10-CM | POA: Diagnosis not present

## 2023-02-23 ENCOUNTER — Other Ambulatory Visit: Payer: Self-pay | Admitting: Cardiology

## 2023-02-23 DIAGNOSIS — I5032 Chronic diastolic (congestive) heart failure: Secondary | ICD-10-CM

## 2023-02-23 DIAGNOSIS — I5022 Chronic systolic (congestive) heart failure: Secondary | ICD-10-CM

## 2023-02-23 DIAGNOSIS — I429 Cardiomyopathy, unspecified: Secondary | ICD-10-CM

## 2023-03-10 DIAGNOSIS — G894 Chronic pain syndrome: Secondary | ICD-10-CM | POA: Diagnosis not present

## 2023-03-10 DIAGNOSIS — M961 Postlaminectomy syndrome, not elsewhere classified: Secondary | ICD-10-CM | POA: Diagnosis not present

## 2023-03-10 DIAGNOSIS — Z79891 Long term (current) use of opiate analgesic: Secondary | ICD-10-CM | POA: Diagnosis not present

## 2023-03-10 DIAGNOSIS — G4700 Insomnia, unspecified: Secondary | ICD-10-CM | POA: Diagnosis not present

## 2023-05-06 NOTE — Telephone Encounter (Signed)
done

## 2023-05-21 DIAGNOSIS — Z0000 Encounter for general adult medical examination without abnormal findings: Secondary | ICD-10-CM | POA: Diagnosis not present

## 2023-05-21 DIAGNOSIS — I700 Atherosclerosis of aorta: Secondary | ICD-10-CM | POA: Diagnosis not present

## 2023-05-21 DIAGNOSIS — I428 Other cardiomyopathies: Secondary | ICD-10-CM | POA: Diagnosis not present

## 2023-05-21 DIAGNOSIS — I110 Hypertensive heart disease with heart failure: Secondary | ICD-10-CM | POA: Diagnosis not present

## 2023-05-21 DIAGNOSIS — N1831 Chronic kidney disease, stage 3a: Secondary | ICD-10-CM | POA: Diagnosis not present

## 2023-05-24 ENCOUNTER — Other Ambulatory Visit: Payer: Self-pay | Admitting: Cardiology

## 2023-05-24 DIAGNOSIS — I429 Cardiomyopathy, unspecified: Secondary | ICD-10-CM

## 2023-05-24 DIAGNOSIS — I5022 Chronic systolic (congestive) heart failure: Secondary | ICD-10-CM

## 2023-05-28 ENCOUNTER — Ambulatory Visit: Payer: 59

## 2023-05-28 DIAGNOSIS — I5032 Chronic diastolic (congestive) heart failure: Secondary | ICD-10-CM

## 2023-06-04 NOTE — Progress Notes (Signed)
Tried calling pt no answer left vm

## 2023-06-04 NOTE — Progress Notes (Signed)
Pt daughter called back made aware of her results

## 2023-06-05 DIAGNOSIS — L0390 Cellulitis, unspecified: Secondary | ICD-10-CM | POA: Diagnosis not present

## 2023-06-05 DIAGNOSIS — L602 Onychogryphosis: Secondary | ICD-10-CM | POA: Diagnosis not present

## 2023-06-05 DIAGNOSIS — I130 Hypertensive heart and chronic kidney disease with heart failure and stage 1 through stage 4 chronic kidney disease, or unspecified chronic kidney disease: Secondary | ICD-10-CM | POA: Diagnosis not present

## 2023-06-05 DIAGNOSIS — I8310 Varicose veins of unspecified lower extremity with inflammation: Secondary | ICD-10-CM | POA: Diagnosis not present

## 2023-06-05 DIAGNOSIS — I83899 Varicose veins of unspecified lower extremities with other complications: Secondary | ICD-10-CM | POA: Diagnosis not present

## 2023-06-05 DIAGNOSIS — I83024 Varicose veins of left lower extremity with ulcer of heel and midfoot: Secondary | ICD-10-CM | POA: Diagnosis not present

## 2023-06-05 DIAGNOSIS — L97429 Non-pressure chronic ulcer of left heel and midfoot with unspecified severity: Secondary | ICD-10-CM | POA: Diagnosis not present

## 2023-06-05 DIAGNOSIS — I509 Heart failure, unspecified: Secondary | ICD-10-CM | POA: Diagnosis not present

## 2023-06-05 DIAGNOSIS — L97329 Non-pressure chronic ulcer of left ankle with unspecified severity: Secondary | ICD-10-CM | POA: Diagnosis not present

## 2023-06-05 DIAGNOSIS — I83023 Varicose veins of left lower extremity with ulcer of ankle: Secondary | ICD-10-CM | POA: Diagnosis not present

## 2023-06-09 ENCOUNTER — Ambulatory Visit: Payer: 59 | Admitting: Cardiology

## 2023-06-09 ENCOUNTER — Encounter (HOSPITAL_COMMUNITY): Payer: Self-pay

## 2023-06-09 ENCOUNTER — Emergency Department (HOSPITAL_BASED_OUTPATIENT_CLINIC_OR_DEPARTMENT_OTHER): Payer: Medicare Other

## 2023-06-09 ENCOUNTER — Emergency Department (HOSPITAL_COMMUNITY): Payer: Medicare Other

## 2023-06-09 ENCOUNTER — Other Ambulatory Visit: Payer: Self-pay

## 2023-06-09 ENCOUNTER — Emergency Department (HOSPITAL_COMMUNITY)
Admission: EM | Admit: 2023-06-09 | Discharge: 2023-06-09 | Disposition: A | Discharge status: Home / Self Care | Payer: Medicare Other | Attending: Emergency Medicine | Admitting: Emergency Medicine

## 2023-06-09 DIAGNOSIS — M79672 Pain in left foot: Secondary | ICD-10-CM | POA: Diagnosis not present

## 2023-06-09 DIAGNOSIS — Z79899 Other long term (current) drug therapy: Secondary | ICD-10-CM | POA: Insufficient documentation

## 2023-06-09 DIAGNOSIS — L03116 Cellulitis of left lower limb: Secondary | ICD-10-CM | POA: Insufficient documentation

## 2023-06-09 DIAGNOSIS — M7732 Calcaneal spur, left foot: Secondary | ICD-10-CM | POA: Diagnosis not present

## 2023-06-09 DIAGNOSIS — I509 Heart failure, unspecified: Secondary | ICD-10-CM | POA: Diagnosis not present

## 2023-06-09 DIAGNOSIS — M25572 Pain in left ankle and joints of left foot: Secondary | ICD-10-CM | POA: Diagnosis not present

## 2023-06-09 DIAGNOSIS — I110 Hypertensive heart disease with heart failure: Secondary | ICD-10-CM | POA: Diagnosis not present

## 2023-06-09 DIAGNOSIS — R52 Pain, unspecified: Secondary | ICD-10-CM | POA: Diagnosis not present

## 2023-06-09 DIAGNOSIS — M7989 Other specified soft tissue disorders: Secondary | ICD-10-CM | POA: Diagnosis not present

## 2023-06-09 DIAGNOSIS — M2012 Hallux valgus (acquired), left foot: Secondary | ICD-10-CM | POA: Diagnosis not present

## 2023-06-09 LAB — COMPREHENSIVE METABOLIC PANEL
ALT: 10 U/L (ref 0–44)
AST: 21 U/L (ref 15–41)
Albumin: 4 g/dL (ref 3.5–5.0)
Alkaline Phosphatase: 58 U/L (ref 38–126)
Anion gap: 8 (ref 5–15)
BUN: 23 mg/dL (ref 8–23)
CO2: 26 mmol/L (ref 22–32)
Calcium: 9.5 mg/dL (ref 8.9–10.3)
Chloride: 103 mmol/L (ref 98–111)
Creatinine, Ser: 0.99 mg/dL (ref 0.44–1.00)
GFR, Estimated: 55 mL/min — ABNORMAL LOW (ref >60)
Glucose, Bld: 109 mg/dL — ABNORMAL HIGH (ref 70–99)
Potassium: 4.1 mmol/L (ref 3.5–5.1)
Sodium: 137 mmol/L (ref 135–145)
Total Bilirubin: 0.4 mg/dL (ref 0.3–1.2)
Total Protein: 8.3 g/dL — ABNORMAL HIGH (ref 6.5–8.1)

## 2023-06-09 LAB — CBC WITH DIFFERENTIAL/PLATELET
Abs Immature Granulocytes: 0.03 10*3/uL (ref 0.00–0.07)
Basophils Absolute: 0.1 10*3/uL (ref 0.0–0.1)
Basophils Relative: 1 %
Eosinophils Absolute: 0.3 10*3/uL (ref 0.0–0.5)
Eosinophils Relative: 4 %
HCT: 30.9 % — ABNORMAL LOW (ref 36.0–46.0)
Hemoglobin: 10.5 g/dL — ABNORMAL LOW (ref 12.0–15.0)
Immature Granulocytes: 0 %
Lymphocytes Relative: 37 %
Lymphs Abs: 2.5 10*3/uL (ref 0.7–4.0)
MCH: 33.3 pg (ref 26.0–34.0)
MCHC: 34 g/dL (ref 30.0–36.0)
MCV: 98.1 fL (ref 80.0–100.0)
Monocytes Absolute: 0.5 10*3/uL (ref 0.1–1.0)
Monocytes Relative: 7 %
Neutro Abs: 3.4 10*3/uL (ref 1.7–7.7)
Neutrophils Relative %: 51 %
Platelets: 36 10*3/uL — ABNORMAL LOW (ref 150–400)
RBC: 3.15 MIL/uL — ABNORMAL LOW (ref 3.87–5.11)
RDW: 13.1 % (ref 11.5–15.5)
WBC: 6.7 10*3/uL (ref 4.0–10.5)
nRBC: 0 % (ref 0.0–0.2)

## 2023-06-09 MED ORDER — AMOXICILLIN-POT CLAVULANATE 875-125 MG PO TABS: 1.0000 | ORAL_TABLET | Freq: Two times a day (BID) | ORAL | 0 refills | Status: AC

## 2023-06-09 MED ORDER — SODIUM CHLORIDE 0.9 % IV SOLN: 1.0000 g | Freq: Once | INTRAVENOUS | Status: DC

## 2023-06-09 NOTE — ED Triage Notes (Signed)
Pt arrived with daughter for R foot swelling and pain. States was seen by PCP on Friday, placed on antibiotic, not getting better. Extremity warm to touch. Denies shob,cp or any other symptoms.

## 2023-06-09 NOTE — ED Provider Notes (Signed)
[place] EMERGENCY DEPARTMENT AT [facility] Provider Note   CSN: [id number] Arrival date & time: 06/09/23  1114     History  Chief Complaint  Patient presents with   Foot Pain    Anita Martinez is a 89 y.o. female.   Foot Pain     Patient has a history of chronic back pain, CHF, cardiomyopathy, hypertension, hyperlipidemia.  She presents to the ED with complaints of foot pain and swelling.  Patient initially started having symptoms about a week ago.  She went to a doctor's office visit on September 13.  Patient was diagnosed with cellulitis.  Patient symptoms have worsened over the last few days.  Increasing redness and swelling, increasing tenderness.  Patient came to the ED for further evaluation.  No known fevers.  No vomiting diarrhea  Home Medications Prior to Admission medications   Medication Sig Start Date End Date Taking? Authorizing Provider  amoxicillin-clavulanate (AUGMENTIN) 875-125 MG tablet Take 1 tablet by mouth every 12 (twelve) hours. 06/09/23  Yes Linwood Dibbles, MD  acetaminophen (TYLENOL) 500 MG tablet Take 500-1,000 mg by mouth 3 (three) times daily as needed for headache.    [provider]  atorvastatin (LIPITOR) 10 MG tablet Take 10 mg by mouth at bedtime. 07/19/20   [provider]  ENTRESTO 24-26 MG TAKE 1 TABLET BY MOUTH TWICE DAILY 02/24/23   Tolia, Sunit, DO  feeding supplement, ENSURE ENLIVE, (ENSURE ENLIVE) LIQD Take 237 mLs by mouth 2 (two) times daily between meals. 02/08/20   Alessandra Bevels, MD  fish oil-omega-3 fatty acids 1000 MG capsule Take 1 g by mouth daily with breakfast.    [provider]  HYDROcodone-acetaminophen (NORCO/VICODIN) 5-325 MG tablet Take 1 tablet by mouth at bedtime as needed (pain).    [provider]  metoprolol succinate (TOPROL-XL) 25 MG 24 hr tablet TAKE 1 TABLET(25 MG) BY MOUTH EVERY MORNING 05/26/23   Tolia, Sunit, DO  Multiple Vitamin (MULTIVITAMIN WITH MINERALS) TABS  tablet Take 1 tablet by mouth daily. 02/09/20   Alessandra Bevels, MD  torsemide (DEMADEX) 10 MG tablet TAKE 1 TABLET BY MOUTH DAILY AS NEEDED. USE IF WEIGHT GAIN MORE THAN 1 POUND OVER 1 DAY OR 3 POUNDS OVER 7 DAYS 12/09/22   Tolia, Sunit, DO      Allergies    Morphine and codeine    Review of Systems   Review of Systems  Physical Exam Updated Vital Signs BP (!) 127/59   Pulse (!) 55   Temp 98 F (36.7 C)   Resp 18   Ht 1.651 m (5\' 5" )   Wt 58 kg   SpO2 96%   BMI 21.28 kg/m  Physical Exam Vitals and nursing note reviewed.  Constitutional:      Appearance: She is well-developed. She is not diaphoretic.     Comments: Elderly, frail  HENT:     Head: Normocephalic and atraumatic.     Right Ear: External ear normal.     Left Ear: External ear normal.  Eyes:     General: No scleral icterus.       Right eye: No discharge.        Left eye: No discharge.     Conjunctiva/sclera: Conjunctivae normal.  Neck:     Trachea: No tracheal deviation.  Cardiovascular:     Rate and Rhythm: Normal rate and regular rhythm.  Pulmonary:     Effort: Pulmonary effort is normal. No respiratory distress.     Breath  sounds: Normal breath sounds. No stridor. No wheezing or rales.  Abdominal:     General: Bowel sounds are normal. There is no distension.     Palpations: Abdomen is soft.     Tenderness: There is no abdominal tenderness. There is no guarding or rebound.  Musculoskeletal:        General: Swelling and tenderness present. No deformity.     Cervical back: Neck supple.     Comments: Swelling and tenderness left foot left ankle, prominent varicose veins noted in the left lower extremity, no open wounds noted although there is a superficial scabbed lesion on the dorsal aspect of the foot  Skin:    General: Skin is warm and dry.  Neurological:     General: No focal deficit present.     Mental Status: She is alert.     Cranial Nerves: No cranial nerve deficit, dysarthria or facial  asymmetry.     Sensory: No sensory deficit.     Motor: No abnormal muscle tone or seizure activity.     Coordination: Coordination normal.  Psychiatric:        Mood and Affect: Mood normal.     ED Results / Procedures / Treatments   Labs (all labs ordered are listed, but only abnormal results are displayed) Labs Reviewed  CBC WITH DIFFERENTIAL/PLATELET - Abnormal; Notable for the following components:      Result Value   RBC 3.15 (*)    Hemoglobin 10.5 (*)    HCT 30.9 (*)    Platelets 36 (*)    All other components within normal limits  COMPREHENSIVE METABOLIC PANEL - Abnormal; Notable for the following components:   Glucose, Bld 109 (*)    Total Protein 8.3 (*)    GFR, Estimated 55 (*)    All other components within normal limits    EKG None  Radiology VAS [place] LOWER EXTREMITY VENOUS (DVT) (7a-7p)  Result Date: 06/09/2023  Lower Venous DVT Study Patient Name:  HASET MONACHINO [facility]  Date of Exam:   06/09/2023 Medical Rec #: [id number]        Accession #:    [id number] Date of Birth: [birth year]        Patient Gender: F Patient Age:   5 years Exam Location:  [facility] Procedure:      VAS [place] LOWER EXTREMITY VENOUS (DVT) Referring Phys: Cletis Athens Emilia Kayes --------------------------------------------------------------------------------  Indications: Pain, and Swelling (left foot).  Comparison Study: No previous exams Performing Technologist: Jody Hill RVT, RDMS  Examination Guidelines: A complete evaluation includes B-mode imaging, spectral Doppler, color Doppler, and power Doppler as needed of all accessible portions of each vessel. Bilateral testing is considered an integral part of a complete examination. Limited examinations for reoccurring indications may be performed as noted. The reflux portion of the exam is performed with the patient in reverse Trendelenburg.  +-----+---------------+---------+-----------+----------+--------------+  RIGHTCompressibilityPhasicitySpontaneityPropertiesThrombus Aging +-----+---------------+---------+-----------+----------+--------------+ CFV  Full           Yes      Yes                                 +-----+---------------+---------+-----------+----------+--------------+   +---------+---------------+---------+-----------+----------+--------------+ LEFT     CompressibilityPhasicitySpontaneityPropertiesThrombus Aging +---------+---------------+---------+-----------+----------+--------------+ CFV      Full           Yes      Yes                                 +---------+---------------+---------+-----------+----------+--------------+  SFJ      Full                                                        +---------+---------------+---------+-----------+----------+--------------+ FV Prox  Full           Yes      Yes                                 +---------+---------------+---------+-----------+----------+--------------+ FV Mid   Full           Yes      Yes                                 +---------+---------------+---------+-----------+----------+--------------+ FV DistalFull           Yes      Yes                                 +---------+---------------+---------+-----------+----------+--------------+ PFV      Full                                                        +---------+---------------+---------+-----------+----------+--------------+ POP      Full           Yes      Yes                                 +---------+---------------+---------+-----------+----------+--------------+ PTV      Full                                                        +---------+---------------+---------+-----------+----------+--------------+ PERO     Full                                                        +---------+---------------+---------+-----------+----------+--------------+     Summary: RIGHT: - No evidence of common femoral vein  obstruction.   LEFT: - There is no evidence of deep vein thrombosis in the lower extremity.  - No cystic structure found in the popliteal fossa.  *See table(s) above for measurements and observations. Electronically signed by Waverly Ferrari MD on 06/09/2023 at 4:52:41 PM.    Final    DG Ankle Complete Left  Result Date: 06/09/2023 CLINICAL DATA:  Left foot and ankle pain and swelling.  Bruising. EXAM: LEFT ANKLE COMPLETE - 3+ VIEW COMPARISON:  Foot radiograph earlier today. FINDINGS: The bones are subjectively under mineralized. No fracture or dislocation. The ankle mortise is preserved. Moderate plantar calcaneal spur. No erosion or focal bone abnormality. Generalized soft tissue edema. There  is a questionable subcutaneous density adjacent to the lateral malleolus that may represent a non metallic foreign body. This measures approximately 4 mm. No significant ankle joint effusion. IMPRESSION: 1. Generalized soft tissue edema. Questionable subcutaneous density adjacent to the lateral malleolus may represent a non metallic foreign body. 2. No acute osseous abnormality. 3. Moderate plantar calcaneal spur. Electronically Signed   By: Narda Rutherford M.D.   On: 06/09/2023 15:15   DG Foot Complete Left  Result Date: 06/09/2023 CLINICAL DATA:  Diffuse left foot pain and swelling. No known injury. EXAM: LEFT FOOT - COMPLETE 3+ VIEW COMPARISON:  Left foot radiographs 04/23/2022 FINDINGS: There is diffuse decreased bone mineralization. Mild hallux valgus. Minimal great toe metatarsophalangeal joint space narrowing. Mild-to-moderate plantar calcaneal heel spur. Mild talonavicular joint space narrowing and mild-to-moderate dorsal osteophytosis. New moderate dorsal greater than plantar midfoot soft tissue swelling greatest at the level of the metatarsal shafts. No definite acute fracture is seen. No dislocation. IMPRESSION: 1. New moderate dorsal greater than plantar midfoot soft tissue swelling greatest at the  level of the metatarsal shafts. No definite acute fracture is seen. 2. Mild hallux valgus. 3. Mild-to-moderate plantar calcaneal heel spur. Electronically Signed   By: Neita Garnet M.D.   On: 06/09/2023 13:11    Procedures Procedures    Medications Ordered in ED Medications - No data to display  ED Course/ Medical Decision Making/ A&P Clinical Course as of 06/09/23 1709  Tue Jun 09, 2023  1316 Doppler study is negative for DVT [JK]  1317 X-ray shows soft tissue swelling without definite fracture in the foot [JK]  1612 X-ray shows evidence of edema.  Questionable foreign body noted over lateral malleolus.  Pt denies any injuries or wounds [JK]  1616 Pt was able to ambulate to the bathroom  [JK]  1706 Labs reviewed No leukocytosis noted.  No significant electrolyte abnormalities [JK]    Clinical Course User Index [JK] Linwood Dibbles, MD                                 Medical Decision Making Problems Addressed: Cellulitis of left lower extremity: acute illness or injury that poses a threat to life or bodily functions  Amount and/or Complexity of Data Reviewed Labs: ordered. Decision-making details documented in ED Course. Radiology: ordered and independent interpretation performed.  Risk Prescription drug management.   Presented to the ED for evaluation of lower extremity pain.  Patient recently diagnosed with cellulitis.  Patient does have erythema and tenderness of her left foot and ankle.  No recent trauma.  Questionable foreign body noted but patient has not had any trauma and there is no acute wound.  Doppler study does not show any DVT.  X-rays do not show any fracture.  Patient does have findings suggestive of cellulitis.  She is afebrile does not have any systemic symptoms.  Patient was able to ambulate in the ED.  Will broaden her course of antibiotics to Augmentin.  Appears appropriate for continued outpatient management close follow-up.        Final Clinical  Impression(s) / ED Diagnoses Final diagnoses:  Cellulitis of left lower extremity    Rx / DC Orders ED Discharge Orders          Ordered    amoxicillin-clavulanate (AUGMENTIN) 875-125 MG tablet  Every 12 hours        06/09/23 1705  Linwood Dibbles, MD 06/09/23 [phone]

## 2023-06-09 NOTE — ED Notes (Signed)
Attempted blood draw, but unable to obtain sample

## 2023-06-09 NOTE — ED Provider Triage Note (Signed)
Emergency Medicine Provider Triage Evaluation Note  Anita Martinez , a 89 y.o. female  was evaluated in triage.  Pt complains of left foot pain and redness  Review of Systems  Positive: pain Negative: fever  Physical Exam  BP (!) 141/64 (BP Location: Left Arm)   Pulse (!) 116   Temp 98 F (36.7 C) (Oral)   Resp 16   Ht 5\' 5"  (1.651 m)   Wt 58 kg   SpO2 99%   BMI 21.28 kg/m  Gen:   Awake, no distress   Resp:  Normal effort  MSK:   Redness left foot,  pain with movement.  Other:    Medical Decision Making  Medically screening exam initiated at 11:42 AM.  Appropriate orders placed.  LIZANN DOOL was informed that the remainder of the evaluation will be completed by another provider, this initial triage assessment does not replace that evaluation, and the importance of remaining in the ED until their evaluation is complete.     Elson Areas, [place] 06/09/23 1143

## 2023-06-09 NOTE — Discharge Instructions (Signed)
Take the antibiotics as prescribed.  You can discontinue the Keflex.  Return to the ER for fevers,chills, worsening symptoms.  Try to keep your leg elevated.  Follow-up with your doctor next week to make sure your symptoms are improving.

## 2023-06-09 NOTE — Progress Notes (Signed)
LLE venous duplex has been completed.  Preliminary results given to Dr. Lynelle Doctor.     Results can be found under chart review under CV PROC. 06/09/2023 1:24 PM Maurisa Tesmer RVT, RDMS

## 2023-06-30 ENCOUNTER — Other Ambulatory Visit: Payer: Self-pay | Admitting: *Deleted

## 2023-06-30 DIAGNOSIS — I872 Venous insufficiency (chronic) (peripheral): Secondary | ICD-10-CM

## 2023-07-08 NOTE — Progress Notes (Addendum)
Patient ID: Anita Martinez, female   DOB: [birth year], 89 y.o.   MRN: [id number]  Reason for Consult: New Patient (Initial Visit)   Referred by [name],*  Subjective:     HPI  Anita Martinez is a 89 y.o. female with CHF, HTN and HLD presenting for swelling, pain and ulceration of the left ankle/foot.  She is accompanied by her daughter reports she has had bilateral varicosities for some time.  Approximately a year ago she had a bleed event but had stopped spontaneously.  She developed a small scab ulcers over the dorsum of her foot and lateral left ankle which has been healing.  She is noncompliant with compression despite her daughter buying multiple different types.  Past Medical History:  Diagnosis Date   Cardiomyopathy (HCC)    CHF (congestive heart failure) (HCC)    Chronic back pain    Colon polyps    Colonic diverticular abscess    Hyperlipidemia    Hypertension    MR (mitral regurgitation)    TR (tricuspid regurgitation)    History reviewed. No pertinent family history. Past Surgical History:  Procedure Laterality Date   ABDOMINAL HYSTERECTOMY     BLADDER SUSPENSION      Short Social History:  Social History   Tobacco Use   Smoking status: Former    Current packs/day: 0.00    Average packs/day: 1 pack/day for 14.0 years (14.0 ttl pk-yrs)    Types: Cigarettes    Start date: 54    Quit date: 1970    Years since quitting: 54.8   Smokeless tobacco: Never  Substance Use Topics   Alcohol use: No    Allergies  Allergen Reactions   Morphine And Codeine Other (See Comments)    Makes patient hyper and unable to sleep    Current Outpatient Medications  Medication Sig Dispense Refill   acetaminophen (TYLENOL) 500 MG tablet Take 500-1,000 mg by mouth 3 (three) times daily as needed for headache.     amoxicillin-clavulanate (AUGMENTIN) 875-125 MG tablet Take 1 tablet by mouth every 12 (twelve) hours. 14 tablet 0   atorvastatin (LIPITOR) 10 MG  tablet Take 10 mg by mouth at bedtime.     ENTRESTO 24-26 MG TAKE 1 TABLET BY MOUTH TWICE DAILY 180 tablet 0   feeding supplement, ENSURE ENLIVE, (ENSURE ENLIVE) LIQD Take 237 mLs by mouth 2 (two) times daily between meals. 237 mL 12   fish oil-omega-3 fatty acids 1000 MG capsule Take 1 g by mouth daily with breakfast.     HYDROcodone-acetaminophen (NORCO/VICODIN) 5-325 MG tablet Take 1 tablet by mouth at bedtime as needed (pain).     metoprolol succinate (TOPROL-XL) 25 MG 24 hr tablet TAKE 1 TABLET(25 MG) BY MOUTH EVERY MORNING 90 tablet 0   Multiple Vitamin (MULTIVITAMIN WITH MINERALS) TABS tablet Take 1 tablet by mouth daily. 30 tablet 1   torsemide (DEMADEX) 10 MG tablet TAKE 1 TABLET BY MOUTH DAILY AS NEEDED. USE IF WEIGHT GAIN MORE THAN 1 POUND OVER 1 DAY OR 3 POUNDS OVER 7 DAYS 90 tablet 3   No current facility-administered medications for this visit.    REVIEW OF SYSTEMS  Negative other than noted in HPI     Objective:  Objective   Vitals:   07/10/23 1133  BP: 123/66  Pulse: 69  Resp: 20  Temp: 98 F (36.7 C)  SpO2: 98%  Weight: 126 lb (57.2 kg)  Height: 5\' 5"  (1.651 m)   Body mass index  is 20.97 kg/m.  Physical Exam General: no acute distress Cardiac: hemodynamically stable Pulm: normal work of breathing Neuro: alert, no focal deficit Extremities: Large, torturous varicosities of bilateral lower legs, most significant at the ankles and feet.  They are tender at the left ankle.  2 small healing scabs of the dorsum of the left foot and left lateral ankle.  Minimal edema bilaterally Vascular:   Right: palpable DP, PT  Left: palpable DP, PT   Data: Left leg reflux study: +--------------+---------+------+-----------+------------+--------+  LEFT         Reflux NoRefluxReflux TimeDiameter cmsComments                          Yes                                   +--------------+---------+------+-----------+------------+--------+  CFV          no                                               +--------------+---------+------+-----------+------------+--------+  FV mid        no                                              +--------------+---------+------+-----------+------------+--------+  Popliteal    no                                              +--------------+---------+------+-----------+------------+--------+  GSV at SFJ              yes    >500 ms      0.86              +--------------+---------+------+-----------+------------+--------+  GSV prox thigh          yes    >500 ms      0.56              +--------------+---------+------+-----------+------------+--------+  GSV mid thigh           yes    >500 ms      0.3               +--------------+---------+------+-----------+------------+--------+  GSV dist thighno                            0.19              +--------------+---------+------+-----------+------------+--------+  GSV at knee                                 0.15              +--------------+---------+------+-----------+------------+--------+  GSV prox calf                                       NWV       +--------------+---------+------+-----------+------------+--------+  SSV Pop Fossa no                            0.15              +--------------+---------+------+-----------+------------+--------+  SSV prox calf no                            0.11              +--------------+---------+------+-----------+------------+--------+   Left leg venous duplex Negative for DVT     Assessment/Plan:     Anita Martinez is a 89 y.o. female with chronic venous insufficiency with C5 disease and reflux noted in left GSV I explained the foundation of CVI treatment of compression and elevation I recommended medical grade graduated compression stockings and intermittent leg elevation We also discussed that many patients find symptom improvement with exercise  and if they have access to a pool should attempt water aerobics.  Her and her daughter are not interested in intervention at this time and will trial compression and intermittent elevation. Follow-up as needed      Daria Pastures MD [facility]

## 2023-07-10 ENCOUNTER — Ambulatory Visit (HOSPITAL_COMMUNITY)
Admission: RE | Admit: 2023-07-10 | Discharge: 2023-07-10 | Disposition: A | Discharge status: Home / Self Care | Payer: 59 | Source: Ambulatory Visit | Attending: Vascular Surgery | Admitting: Vascular Surgery

## 2023-07-10 ENCOUNTER — Ambulatory Visit (INDEPENDENT_AMBULATORY_CARE_PROVIDER_SITE_OTHER): Payer: 59 | Admitting: Vascular Surgery

## 2023-07-10 ENCOUNTER — Encounter: Payer: Self-pay | Admitting: Vascular Surgery

## 2023-07-10 VITALS — BP 123/66 | HR 69 | Temp 98.0°F | Resp 20 | Ht 65.0 in | Wt 126.0 lb

## 2023-07-10 DIAGNOSIS — E785 Hyperlipidemia, unspecified: Secondary | ICD-10-CM | POA: Diagnosis not present

## 2023-07-10 DIAGNOSIS — I872 Venous insufficiency (chronic) (peripheral): Secondary | ICD-10-CM

## 2023-07-10 DIAGNOSIS — I10 Essential (primary) hypertension: Secondary | ICD-10-CM | POA: Diagnosis not present

## 2023-07-10 DIAGNOSIS — I5030 Unspecified diastolic (congestive) heart failure: Secondary | ICD-10-CM | POA: Diagnosis not present

## 2023-08-12 DIAGNOSIS — G894 Chronic pain syndrome: Secondary | ICD-10-CM | POA: Diagnosis not present

## 2023-08-12 DIAGNOSIS — M961 Postlaminectomy syndrome, not elsewhere classified: Secondary | ICD-10-CM | POA: Diagnosis not present

## 2023-08-12 DIAGNOSIS — G4700 Insomnia, unspecified: Secondary | ICD-10-CM | POA: Diagnosis not present

## 2023-08-12 DIAGNOSIS — I87322 Chronic venous hypertension (idiopathic) with inflammation of left lower extremity: Secondary | ICD-10-CM | POA: Diagnosis not present

## 2023-08-20 ENCOUNTER — Other Ambulatory Visit: Payer: Self-pay | Admitting: Cardiology

## 2023-08-20 DIAGNOSIS — I5022 Chronic systolic (congestive) heart failure: Secondary | ICD-10-CM

## 2023-08-20 DIAGNOSIS — I429 Cardiomyopathy, unspecified: Secondary | ICD-10-CM

## 2023-11-20 ENCOUNTER — Encounter: Payer: Self-pay | Admitting: Cardiology

## 2023-11-20 ENCOUNTER — Ambulatory Visit: Payer: 59 | Attending: Cardiology | Admitting: Cardiology

## 2023-11-20 VITALS — BP 120/64 | HR 71 | Resp 16 | Ht 65.0 in | Wt 135.0 lb

## 2023-11-20 DIAGNOSIS — E782 Mixed hyperlipidemia: Secondary | ICD-10-CM | POA: Diagnosis not present

## 2023-11-20 DIAGNOSIS — I5022 Chronic systolic (congestive) heart failure: Secondary | ICD-10-CM | POA: Diagnosis not present

## 2023-11-20 DIAGNOSIS — Z87891 Personal history of nicotine dependence: Secondary | ICD-10-CM | POA: Diagnosis not present

## 2023-11-20 DIAGNOSIS — I429 Cardiomyopathy, unspecified: Secondary | ICD-10-CM

## 2023-11-20 DIAGNOSIS — I5032 Chronic diastolic (congestive) heart failure: Secondary | ICD-10-CM | POA: Diagnosis not present

## 2023-11-20 MED ORDER — TORSEMIDE 10 MG PO TABS: 10.0000 mg | ORAL_TABLET | Freq: Every day | ORAL | 3 refills | Status: AC | PRN

## 2023-11-20 MED ORDER — METOPROLOL SUCCINATE ER 25 MG PO TB24: 25.0000 mg | ORAL_TABLET | Freq: Every day | ORAL | 3 refills | Status: DC

## 2023-11-20 NOTE — Progress Notes (Signed)
 Cardiology Office Note:  .   Date:  11/20/2023  ID:  Anita Martinez, DOB [birth year], MRN [id number] PCP:  [name]  Former Cardiology Providers: NA [facility] Providers Cardiologist:  Tessa Lerner, DO , [facility] Electrophysiologist:  None  Click to update primary MD,subspecialty MD or APP then REFRESH:1}    Chief Complaint  Patient presents with   Chronic heart failure with preserved ejection fraction   Follow-up    History of Present Illness: .   Anita Martinez is a 89 y.o.  female whose past medical history and cardiovascular risk factors includes: Chronic HFpEF stage C, NYHA class II, recovered cardiomyopathy, mixed hyperlipidemia, former smoker, advanced age, postmenopausal female.   Patient is accompanied by her daughter, Anita Martinez ([id number]), at today's office visit.     In May 2021 she was diagnosed with biventricular heart failure with acute decompensation and admitted to the hospital.  Due to advanced age and multiple comorbidities including frailty family did not want to proceed with invasive diagnostic testing and favored medical therapy.   With uptitration of GDMT hemodynamically she has improved and her repeat echocardiogram has noted improvement in LVEF as well.  Patient presents today for 1 year follow-up visit.  Overall doing well from a cardiovascular standpoint denies anginal chest pain or heart failure symptoms.  Overall functional capacity also remains stable  Review of Systems: .   Review of Systems  Constitutional: Positive for weight gain.  Cardiovascular:  Negative for chest pain, claudication, irregular heartbeat, leg swelling, near-syncope, orthopnea, palpitations, paroxysmal nocturnal dyspnea and syncope.  Respiratory:  Negative for shortness of breath.   Hematologic/Lymphatic: Negative for bleeding problem.    Studies Reviewed:   EKG: EKG Interpretation Date/Time:  Friday November 20 2023 08:11:08 EST Ventricular Rate:  71 PR  Interval:    QRS Duration:  66 QT Interval:  380 QTC Calculation: 412 R Axis:   12  Text Interpretation: Sinus rhythm with premature ventricular or aberrantly conducted complexes Low voltage QRS Cannot rule out Anteroseptal infarct , age undetermined When compared with ECG of 23-Apr-2022 11:19, Premature ventricular complexes are new compared to prior tracing. Confirmed by Tessa Lerner [id number]) on 11/20/2023 8:15:39 AM  Echocardiogram: 05/07/2020: LVEF 20-25%  04/24/2021: LVEF 50-85%, grade 1 diastolic dysfunction, mild LAE, mild MR, aortic valve sclerosis without stenosis, see report for additional details  05/28/2023: LVEF 60-65%, indeterminate diastolic filling pattern, mildly dilated left atrium, aortic valve sclerosis without stenosis, mild MR, mild to moderate TR, mild PR.  RADIOLOGY: NA  Risk Assessment/Calculations:   NA   Labs:       Latest Ref Rng & Units 06/09/2023    1:43 PM 04/24/2022    3:11 PM 04/24/2022    7:13 AM  CBC  WBC 4.0 - 10.5 K/uL 6.7   6.6   Hemoglobin 12.0 - 15.0 g/dL 21.3  9.2  7.0   Hematocrit 36.0 - 46.0 % 30.9  27.4  21.7   Platelets 150 - 400 K/uL 36   128        Latest Ref Rng & Units 06/09/2023    4:05 PM 05/28/2022    9:50 AM 04/24/2022    4:26 AM  BMP  Glucose 70 - 99 mg/dL 086  92  90   BUN 8 - 23 mg/dL 23  16  14    Creatinine 0.44 - 1.00 mg/dL 5.78  4.69  6.29   BUN/Creat Ratio 12 - 28  14    Sodium 135 - 145 mmol/L  137  143  140   Potassium 3.5 - 5.1 mmol/L 4.1  4.2  4.0   Chloride 98 - 111 mmol/L 103  102  111   CO2 22 - 32 mmol/L 26  23  24    Calcium 8.9 - 10.3 mg/dL 9.5  16.1  8.8       Latest Ref Rng & Units 06/09/2023    4:05 PM 05/28/2022    9:50 AM 04/24/2022    4:26 AM  CMP  Glucose 70 - 99 mg/dL 096  92  90   BUN 8 - 23 mg/dL 23  16  14    Creatinine 0.44 - 1.00 mg/dL 0.45  4.09  8.11   Sodium 135 - 145 mmol/L 137  143  140   Potassium 3.5 - 5.1 mmol/L 4.1  4.2  4.0   Chloride 98 - 111 mmol/L 103  102  111   CO2 22 - 32 mmol/L  26  23  24    Calcium 8.9 - 10.3 mg/dL 9.5  91.4  8.8   Total Protein 6.5 - 8.1 g/dL 8.3     Total Bilirubin 0.3 - 1.2 mg/dL 0.4     Alkaline Phos 38 - 126 U/L 58     AST 15 - 41 U/L 21     ALT 0 - 44 U/L 10       External Labs: Collected: 05/21/2023 [facility] database. Total cholesterol 184, LDL 90, HDL 38, triglycerides 337  Physical Exam:    Today's Vitals   11/20/23 0807  BP: 120/64  Pulse: 71  Resp: 16  SpO2: 95%  Weight: 135 lb (61.2 kg)  Height: 5\' 5"  (1.651 m)   Body mass index is 22.47 kg/m. Wt Readings from Last 3 Encounters:  11/20/23 135 lb (61.2 kg)  07/10/23 126 lb (57.2 kg)  06/09/23 127 lb 13.9 oz (58 kg)    Physical Exam  Constitutional: No distress. She appears chronically ill.  Hemodynamically stable.   Neck: No JVD present.  Cardiovascular: Normal rate, regular rhythm, S1 normal, S2 normal, intact distal pulses and normal pulses. Exam reveals no gallop, no S3 and no S4.  No murmur heard. Pulmonary/Chest: Effort normal and breath sounds normal. No stridor. She has no wheezes. She has no rales.  Abdominal: Soft. Bowel sounds are normal. She exhibits no distension. There is no abdominal tenderness.  Musculoskeletal:        General: No edema.     Cervical back: Neck supple.     Comments: varicose veins present bilaterally  Neurological: She is alert and oriented to person, place, and time. She has intact cranial nerves (2-12).  Skin: Skin is warm and moist.     Impression & Recommendation(s):  Impression:   ICD-10-CM   1. Chronic heart failure with preserved ejection fraction (HFpEF) (HCC)  I50.32 EKG 12-Lead    torsemide (DEMADEX) 10 MG tablet    2. Mixed hyperlipidemia  E78.2     3. Former smoker  Z87.891     4. Cardiomyopathy, unspecified type (HCC)  I42.9 metoprolol succinate (TOPROL-XL) 25 MG 24 hr tablet    5. Chronic HFrEF (heart failure with reduced ejection fraction) (HCC)  I50.22 metoprolol succinate (TOPROL-XL) 25 MG 24 hr tablet        Recommendation(s):  Chronic heart failure with preserved ejection fraction (HFpEF) (HCC) Stage C, NYHA class II History of biventricular heart failure during her hospitalization in May 2021 (LVEF 20-25% with grade 3 diastolic dysfunction). With uptitration of maximally  tolerated GDMT her LVEF is improved -as of September 2024. Patient's daughter continues to keep a very close tab of her weights, strict I's and O's and blood pressure readings. She continues to use torsemide 10 mg p.o. daily and as needed basis-medication refilled Continue Toprol-XL 25 mg p.o. every morning, medication refilled. Continue Entresto 24/26 mg p.o. twice daily  Mixed hyperlipidemia Continue atorvastatin 10 mg p.o. every afternoon. Outside labs from August 2024 reviewed via [facility] database. Recommended rechecking fasting lipids, patient's daughter states that they have an appointment with PCP in March 2024 and will have it done at that time and send [place] a copy for reference  Former smoker Reemphasized importance of complete continued cessation.   Orders Placed:  Orders Placed This Encounter  Procedures   EKG 12-Lead   Final Medication List:    Meds ordered this encounter  Medications   metoprolol succinate (TOPROL-XL) 25 MG 24 hr tablet    Sig: Take 1 tablet (25 mg total) by mouth daily.    Dispense:  90 tablet    Refill:  3   torsemide (DEMADEX) 10 MG tablet    Sig: Take 1 tablet (10 mg total) by mouth daily as needed.    Dispense:  90 tablet    Refill:  3    **Patient requests 90 days supply**    Medications Discontinued During This Encounter  Medication Reason   torsemide (DEMADEX) 10 MG tablet Reorder   metoprolol succinate (TOPROL-XL) 25 MG 24 hr tablet Reorder     Current Outpatient Medications:    acetaminophen (TYLENOL) 500 MG tablet, Take 500-1,000 mg by mouth 3 (three) times daily as needed for headache., Disp: , Rfl:    atorvastatin (LIPITOR) 10 MG tablet, Take 10 mg by mouth at  bedtime., Disp: , Rfl:    ENTRESTO 24-26 MG, TAKE 1 TABLET BY MOUTH TWICE DAILY, Disp: 180 tablet, Rfl: 0   feeding supplement, ENSURE ENLIVE, (ENSURE ENLIVE) LIQD, Take 237 mLs by mouth 2 (two) times daily between meals., Disp: 237 mL, Rfl: 12   fish oil-omega-3 fatty acids 1000 MG capsule, Take 1 g by mouth daily with breakfast., Disp: , Rfl:    HYDROcodone-acetaminophen (NORCO/VICODIN) 5-325 MG tablet, Take 1 tablet by mouth at bedtime as needed (pain)., Disp: , Rfl:    Multiple Vitamin (MULTIVITAMIN WITH MINERALS) TABS tablet, Take 1 tablet by mouth daily., Disp: 30 tablet, Rfl: 1   amoxicillin-clavulanate (AUGMENTIN) 875-125 MG tablet, Take 1 tablet by mouth every 12 (twelve) hours. (Patient not taking: Reported on 11/20/2023), Disp: 14 tablet, Rfl: 0   metoprolol succinate (TOPROL-XL) 25 MG 24 hr tablet, Take 1 tablet (25 mg total) by mouth daily., Disp: 90 tablet, Rfl: 3   torsemide (DEMADEX) 10 MG tablet, Take 1 tablet (10 mg total) by mouth daily as needed., Disp: 90 tablet, Rfl: 3  Consent:   NA  Disposition:   1 year follow-up sooner if needed  Her questions and concerns were addressed to her satisfaction. She voices understanding of the recommendations provided during this encounter.    Signed, Tessa Lerner, DO, [facility]  [facility]  [address] [place], [place] [address] 11/20/2023 11:28 AM

## 2023-11-20 NOTE — Patient Instructions (Addendum)
 Medication Instructions:  Your physician recommends that you continue on your current medications as directed. Please refer to the Current Medication list given to you today.  Refill for Metoprolol Succinate (Toprol-XL) and Torsemide (Demadex) have been sent to your pharmacy.  *If you need a refill on your cardiac medications before your next appointment, please call your pharmacy*  Lab Work: None ordered today. If you have labs (blood work) drawn today and your tests are completely normal, you will receive your results only by: MyChart Message (if you have MyChart) OR A paper copy in the mail If you have any lab test that is abnormal or we need to change your treatment, we will call you to review the results.  Testing/Procedures: None ordered today.  Follow-Up: At [facility], you and your health needs are our priority.  As part of our continuing mission to provide you with exceptional heart care, we have created designated Provider Care Teams.  These Care Teams include your primary Cardiologist (physician) and Advanced Practice Providers (APPs -  Physician Assistants and Nurse Practitioners) who all work together to provide you with the care you need, when you need it.  We recommend signing up for the patient portal called "MyChart".  Sign up information is provided on this After Visit Summary.  MyChart is used to connect with patients for Virtual Visits (Telemedicine).  Patients are able to view lab/test results, encounter notes, upcoming appointments, etc.  Non-urgent messages can be sent to your provider as well.   To learn more about what you can do with MyChart, go to [email or web address].    Your next appointment:   1 year(s)  The format for your next appointment:   In Person  Provider:   Tessa Lerner, DO {

## 2023-11-26 DIAGNOSIS — I700 Atherosclerosis of aorta: Secondary | ICD-10-CM | POA: Diagnosis not present

## 2023-11-26 DIAGNOSIS — N1831 Chronic kidney disease, stage 3a: Secondary | ICD-10-CM | POA: Diagnosis not present

## 2023-11-26 DIAGNOSIS — G894 Chronic pain syndrome: Secondary | ICD-10-CM | POA: Diagnosis not present

## 2023-11-26 DIAGNOSIS — D6869 Other thrombophilia: Secondary | ICD-10-CM | POA: Diagnosis not present

## 2023-11-26 DIAGNOSIS — I428 Other cardiomyopathies: Secondary | ICD-10-CM | POA: Diagnosis not present

## 2023-11-26 DIAGNOSIS — I110 Hypertensive heart disease with heart failure: Secondary | ICD-10-CM | POA: Diagnosis not present

## 2023-12-09 DIAGNOSIS — M961 Postlaminectomy syndrome, not elsewhere classified: Secondary | ICD-10-CM | POA: Diagnosis not present

## 2023-12-09 DIAGNOSIS — G894 Chronic pain syndrome: Secondary | ICD-10-CM | POA: Diagnosis not present

## 2023-12-09 DIAGNOSIS — Z79891 Long term (current) use of opiate analgesic: Secondary | ICD-10-CM | POA: Diagnosis not present

## 2023-12-09 DIAGNOSIS — I87322 Chronic venous hypertension (idiopathic) with inflammation of left lower extremity: Secondary | ICD-10-CM | POA: Diagnosis not present

## 2024-03-09 DIAGNOSIS — I87322 Chronic venous hypertension (idiopathic) with inflammation of left lower extremity: Secondary | ICD-10-CM | POA: Diagnosis not present

## 2024-03-09 DIAGNOSIS — G894 Chronic pain syndrome: Secondary | ICD-10-CM | POA: Diagnosis not present

## 2024-03-09 DIAGNOSIS — M961 Postlaminectomy syndrome, not elsewhere classified: Secondary | ICD-10-CM | POA: Diagnosis not present

## 2024-03-09 DIAGNOSIS — Z79891 Long term (current) use of opiate analgesic: Secondary | ICD-10-CM | POA: Diagnosis not present

## 2024-06-02 DIAGNOSIS — D649 Anemia, unspecified: Secondary | ICD-10-CM | POA: Diagnosis not present

## 2024-06-02 DIAGNOSIS — E785 Hyperlipidemia, unspecified: Secondary | ICD-10-CM | POA: Diagnosis not present

## 2024-06-02 DIAGNOSIS — Z0000 Encounter for general adult medical examination without abnormal findings: Secondary | ICD-10-CM | POA: Diagnosis not present

## 2024-06-02 DIAGNOSIS — I428 Other cardiomyopathies: Secondary | ICD-10-CM | POA: Diagnosis not present

## 2024-06-02 DIAGNOSIS — Z13220 Encounter for screening for lipoid disorders: Secondary | ICD-10-CM | POA: Diagnosis not present

## 2024-06-02 DIAGNOSIS — I110 Hypertensive heart disease with heart failure: Secondary | ICD-10-CM | POA: Diagnosis not present

## 2024-06-02 DIAGNOSIS — I5032 Chronic diastolic (congestive) heart failure: Secondary | ICD-10-CM | POA: Diagnosis not present

## 2024-06-02 DIAGNOSIS — N1831 Chronic kidney disease, stage 3a: Secondary | ICD-10-CM | POA: Diagnosis not present

## 2024-06-02 DIAGNOSIS — D6869 Other thrombophilia: Secondary | ICD-10-CM | POA: Diagnosis not present

## 2024-06-08 DIAGNOSIS — G894 Chronic pain syndrome: Secondary | ICD-10-CM | POA: Diagnosis not present

## 2024-06-08 DIAGNOSIS — Z79891 Long term (current) use of opiate analgesic: Secondary | ICD-10-CM | POA: Diagnosis not present

## 2024-06-08 DIAGNOSIS — M961 Postlaminectomy syndrome, not elsewhere classified: Secondary | ICD-10-CM | POA: Diagnosis not present

## 2024-06-08 DIAGNOSIS — I87322 Chronic venous hypertension (idiopathic) with inflammation of left lower extremity: Secondary | ICD-10-CM | POA: Diagnosis not present

## 2024-10-19 ENCOUNTER — Telehealth: Payer: Self-pay | Admitting: Cardiology

## 2024-10-19 MED ORDER — SACUBITRIL-VALSARTAN 24-26 MG PO TABS: 1.0000 | ORAL_TABLET | Freq: Two times a day (BID) | ORAL | 0 refills | Status: AC

## 2024-10-19 NOTE — Telephone Encounter (Signed)
 Pt c/o medication issue:  1. Name of Medication:   ENTRESTO  24-26 MG    2. How are you currently taking this medication (dosage and times per day)?    3. Are you having a reaction (difficulty breathing--STAT)? no  4. What is your medication issue? Daughter called to say the insurance said medication is not on drug list. And that are office need to sent a letter for exception for this patient to be on this medication. Please advise

## 2024-10-19 NOTE — Telephone Encounter (Signed)
 Send in a generic to our pharmacy  if okay per daughter.   Dr. Porcia Morganti

## 2024-10-19 NOTE — Telephone Encounter (Signed)
 Sent in generic to pts pharmacy per daughters preference. States that the insurance said they would cover it.

## 2024-10-28 ENCOUNTER — Other Ambulatory Visit: Payer: Self-pay | Admitting: Cardiology

## 2024-10-28 DIAGNOSIS — I429 Cardiomyopathy, unspecified: Secondary | ICD-10-CM

## 2024-10-28 DIAGNOSIS — I5022 Chronic systolic (congestive) heart failure: Secondary | ICD-10-CM

## 2024-11-28 ENCOUNTER — Ambulatory Visit: Admitting: Cardiology
